# Patient Record
Sex: Female | Born: 1985
Health system: Southern US, Community
[De-identification: ages and names within clinical notes are randomized; demographics above are authoritative.]

## PROBLEM LIST (undated history)

## (undated) ENCOUNTER — Inpatient Hospital Stay (HOSPITAL_COMMUNITY): Payer: Self-pay

## (undated) DIAGNOSIS — F32A Depression, unspecified: Secondary | ICD-10-CM

## (undated) DIAGNOSIS — Z803 Family history of malignant neoplasm of breast: Secondary | ICD-10-CM

## (undated) DIAGNOSIS — B001 Herpesviral vesicular dermatitis: Secondary | ICD-10-CM

## (undated) DIAGNOSIS — F410 Panic disorder [episodic paroxysmal anxiety] without agoraphobia: Secondary | ICD-10-CM

## (undated) DIAGNOSIS — F329 Major depressive disorder, single episode, unspecified: Secondary | ICD-10-CM

## (undated) DIAGNOSIS — IMO0002 Reserved for concepts with insufficient information to code with codable children: Secondary | ICD-10-CM

## (undated) DIAGNOSIS — Z8042 Family history of malignant neoplasm of prostate: Secondary | ICD-10-CM

## (undated) DIAGNOSIS — F419 Anxiety disorder, unspecified: Secondary | ICD-10-CM

## (undated) DIAGNOSIS — Z808 Family history of malignant neoplasm of other organs or systems: Secondary | ICD-10-CM

## (undated) DIAGNOSIS — Z8481 Family history of carrier of genetic disease: Secondary | ICD-10-CM

## (undated) DIAGNOSIS — O24419 Gestational diabetes mellitus in pregnancy, unspecified control: Secondary | ICD-10-CM

## (undated) HISTORY — DX: Family history of carrier of genetic disease: Z84.81

## (undated) HISTORY — DX: Reserved for concepts with insufficient information to code with codable children: IMO0002

## (undated) HISTORY — DX: Family history of malignant neoplasm of breast: Z80.3

## (undated) HISTORY — PX: WISDOM TOOTH EXTRACTION: SHX21

## (undated) HISTORY — DX: Family history of malignant neoplasm of other organs or systems: Z80.8

## (undated) HISTORY — DX: Depression, unspecified: F32.A

## (undated) HISTORY — DX: Major depressive disorder, single episode, unspecified: F32.9

## (undated) HISTORY — DX: Family history of malignant neoplasm of prostate: Z80.42

## (undated) HISTORY — DX: Anxiety disorder, unspecified: F41.9

## (undated) HISTORY — DX: Herpesviral vesicular dermatitis: B00.1

## (undated) HISTORY — DX: Panic disorder (episodic paroxysmal anxiety): F41.0

---
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
## 2010-02-01 ENCOUNTER — Encounter: Admission: RE | Admit: 2010-02-01 | Discharge: 2010-02-01 | Payer: Self-pay | Admitting: Gastroenterology

## 2010-03-01 ENCOUNTER — Encounter: Admission: RE | Admit: 2010-03-01 | Discharge: 2010-03-01 | Payer: Self-pay | Admitting: Emergency Medicine

## 2010-06-10 ENCOUNTER — Encounter: Payer: Self-pay | Admitting: Pediatrics

## 2010-10-04 ENCOUNTER — Emergency Department (HOSPITAL_COMMUNITY)
Admission: EM | Admit: 2010-10-04 | Discharge: 2010-10-04 | Disposition: A | Discharge status: Home / Self Care | Payer: 59 | Attending: Emergency Medicine | Admitting: Emergency Medicine

## 2010-10-04 DIAGNOSIS — Z79899 Other long term (current) drug therapy: Secondary | ICD-10-CM | POA: Insufficient documentation

## 2010-10-04 DIAGNOSIS — T50995A Adverse effect of other drugs, medicaments and biological substances, initial encounter: Secondary | ICD-10-CM | POA: Insufficient documentation

## 2010-10-04 DIAGNOSIS — T4275XA Adverse effect of unspecified antiepileptic and sedative-hypnotic drugs, initial encounter: Secondary | ICD-10-CM | POA: Insufficient documentation
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
## 2011-02-27 ENCOUNTER — Emergency Department (HOSPITAL_COMMUNITY)
Admission: EM | Admit: 2011-02-27 | Discharge: 2011-02-27 | Disposition: A | Discharge status: Home / Self Care | Payer: 59 | Attending: Emergency Medicine | Admitting: Emergency Medicine

## 2011-02-27 DIAGNOSIS — F319 Bipolar disorder, unspecified: Secondary | ICD-10-CM | POA: Insufficient documentation

## 2011-02-27 DIAGNOSIS — F411 Generalized anxiety disorder: Secondary | ICD-10-CM | POA: Insufficient documentation

## 2011-02-27 DIAGNOSIS — Z79899 Other long term (current) drug therapy: Secondary | ICD-10-CM | POA: Insufficient documentation

## 2011-02-27 LAB — COMPREHENSIVE METABOLIC PANEL
ALT: 10 U/L (ref 0–35)
AST: 15 U/L (ref 0–37)
Albumin: 3.5 g/dL (ref 3.5–5.2)
Alkaline Phosphatase: 66 U/L (ref 39–117)
BUN: 7 mg/dL (ref 6–23)
CO2: 27 mEq/L (ref 19–32)
Calcium: 9.4 mg/dL (ref 8.4–10.5)
Chloride: 103 mEq/L (ref 96–112)
Creatinine, Ser: 0.66 mg/dL (ref 0.50–1.10)
GFR calc Af Amer: >90 mL/min (ref >90)
GFR calc non Af Amer: >90 mL/min (ref >90)
Glucose, Bld: 78 mg/dL (ref 70–99)
Potassium: 3.9 mEq/L (ref 3.5–5.1)
Sodium: 136 mEq/L (ref 135–145)
Total Bilirubin: 0.2 mg/dL — ABNORMAL LOW (ref 0.3–1.2)
Total Protein: 6.9 g/dL (ref 6.0–8.3)

## 2011-02-27 LAB — DIFFERENTIAL
Basophils Absolute: 0 10*3/uL (ref 0.0–0.1)
Basophils Relative: 0 % (ref 0–1)
Eosinophils Absolute: 0.1 10*3/uL (ref 0.0–0.7)
Eosinophils Relative: 2 % (ref 0–5)
Lymphocytes Relative: 45 % (ref 12–46)
Lymphs Abs: 2.3 10*3/uL (ref 0.7–4.0)
Monocytes Absolute: 0.4 10*3/uL (ref 0.1–1.0)
Monocytes Relative: 9 % (ref 3–12)
Neutro Abs: 2.3 10*3/uL (ref 1.7–7.7)
Neutrophils Relative %: 45 % (ref 43–77)

## 2011-02-27 LAB — CBC
HCT: 38.1 % (ref 36.0–46.0)
Hemoglobin: 12.6 g/dL (ref 12.0–15.0)
MCH: 29.5 pg (ref 26.0–34.0)
MCHC: 33.1 g/dL (ref 30.0–36.0)
MCV: 89.2 fL (ref 78.0–100.0)
Platelets: 203 10*3/uL (ref 150–400)
RBC: 4.27 MIL/uL (ref 3.87–5.11)
RDW: 12.7 % (ref 11.5–15.5)
WBC: 5.1 10*3/uL (ref 4.0–10.5)

## 2011-02-27 LAB — ETHANOL: Alcohol, Ethyl (B): <11 mg/dL (ref 0–11)

## 2011-02-27 LAB — RAPID URINE DRUG SCREEN, HOSP PERFORMED
Amphetamines: NOT DETECTED
Barbiturates: NOT DETECTED
Benzodiazepines: NOT DETECTED
Cocaine: NOT DETECTED
Opiates: NOT DETECTED
Tetrahydrocannabinol: NOT DETECTED

## 2011-03-05 ENCOUNTER — Inpatient Hospital Stay (HOSPITAL_COMMUNITY)
Admission: RE | Admit: 2011-03-05 | Discharge: 2011-03-07 | DRG: 885 | Disposition: A | Discharge status: Home / Self Care | Payer: 59 | Attending: Psychiatry | Admitting: Psychiatry

## 2011-03-05 DIAGNOSIS — F411 Generalized anxiety disorder: Secondary | ICD-10-CM

## 2011-03-05 DIAGNOSIS — R45851 Suicidal ideations: Secondary | ICD-10-CM

## 2011-03-05 DIAGNOSIS — F339 Major depressive disorder, recurrent, unspecified: Principal | ICD-10-CM

## 2011-03-05 DIAGNOSIS — IMO0002 Reserved for concepts with insufficient information to code with codable children: Secondary | ICD-10-CM

## 2011-03-06 DIAGNOSIS — F411 Generalized anxiety disorder: Secondary | ICD-10-CM

## 2011-03-06 DIAGNOSIS — F339 Major depressive disorder, recurrent, unspecified: Secondary | ICD-10-CM

## 2011-03-06 LAB — URINALYSIS, ROUTINE W REFLEX MICROSCOPIC
Bilirubin Urine: NEGATIVE
Glucose, UA: NEGATIVE mg/dL
Hgb urine dipstick: NEGATIVE
Ketones, ur: NEGATIVE mg/dL
Leukocytes, UA: NEGATIVE
Nitrite: NEGATIVE
Protein, ur: NEGATIVE mg/dL
Specific Gravity, Urine: 1.003 — ABNORMAL LOW (ref 1.005–1.030)
Urobilinogen, UA: 0.2 mg/dL (ref 0.0–1.0)
pH: 6 (ref 5.0–8.0)

## 2011-03-06 LAB — COMPREHENSIVE METABOLIC PANEL
ALT: 13 U/L (ref 0–35)
AST: 15 U/L (ref 0–37)
Albumin: 3.2 g/dL — ABNORMAL LOW (ref 3.5–5.2)
Alkaline Phosphatase: 59 U/L (ref 39–117)
BUN: 9 mg/dL (ref 6–23)
CO2: 28 mEq/L (ref 19–32)
Calcium: 9.4 mg/dL (ref 8.4–10.5)
Chloride: 102 mEq/L (ref 96–112)
Creatinine, Ser: 0.61 mg/dL (ref 0.50–1.10)
GFR calc Af Amer: >90 mL/min (ref >90)
GFR calc non Af Amer: >90 mL/min (ref >90)
Glucose, Bld: 87 mg/dL (ref 70–99)
Potassium: 4 mEq/L (ref 3.5–5.1)
Sodium: 137 mEq/L (ref 135–145)
Total Bilirubin: 0.3 mg/dL (ref 0.3–1.2)
Total Protein: 6.4 g/dL (ref 6.0–8.3)

## 2011-03-06 LAB — CBC
HCT: 37.4 % (ref 36.0–46.0)
Hemoglobin: 12.4 g/dL (ref 12.0–15.0)
MCH: 29.7 pg (ref 26.0–34.0)
MCHC: 33.2 g/dL (ref 30.0–36.0)
MCV: 89.5 fL (ref 78.0–100.0)
Platelets: 179 10*3/uL (ref 150–400)
RBC: 4.18 MIL/uL (ref 3.87–5.11)
RDW: 12.9 % (ref 11.5–15.5)
WBC: 6.5 10*3/uL (ref 4.0–10.5)

## 2011-03-06 LAB — PREGNANCY, URINE: Preg Test, Ur: NEGATIVE

## 2011-03-06 LAB — TSH: TSH: 3.098 u[IU]/mL (ref 0.350–4.500)

## 2011-03-07 LAB — URINE DRUGS OF ABUSE SCREEN W ALC, ROUTINE (REF LAB)
Amphetamine Screen, Ur: NEGATIVE
Barbiturate Quant, Ur: NEGATIVE
Benzodiazepines.: NEGATIVE
Cocaine Metabolites: NEGATIVE
Creatinine,U: 24.8 mg/dL
Ethyl Alcohol: <10 mg/dL (ref <10)
Marijuana Metabolite: NEGATIVE
Methadone: NEGATIVE
Opiate Screen, Urine: NEGATIVE
Phencyclidine (PCP): NEGATIVE
Propoxyphene: NEGATIVE
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
## 2011-03-09 NOTE — Discharge Summary (Signed)
  NAMEGENEIVE, SANDSTROM NO.:  [id number]  MEDICAL RECORD NO.:  [id number]  LOCATION:  0507                          FACILITY:  [facility]  PHYSICIAN:  Franchot Gallo, MD     DATE OF BIRTH:  1986/03/13  DATE OF ADMISSION:  03/05/2011 DATE OF DISCHARGE:  03/07/2011                              DISCHARGE SUMMARY   REASON FOR ADMISSION:  A 25 year old female admitted after having possible side effects to her medications.  Reported a recent increase of her Abilify from 2.5 mg to 20 mg.  She was feeling out of control, did not feel safe driving.  She states when she was feeling out of control she was also feeling suicidal.  FINAL IMPRESSION:  Axis I:  Major depressive disorder, recurrent. Generalized anxiety disorder. Axis II:  Deferred. Axis III:  No acute illnesses. Axis IV:  Medication side effects. Axis V:  Global Assessment of Functioning at discharge was 75.  PERTINENT LABS:  CBC was within normal limits.  Urine pregnancy test was negative.  SIGNIFICANT FINDINGS:  The patient was admitted to the adult milieu for safety and stabilization.  We discontinued her Abilify, but did continue with her Celexa.  She was attending groups and beginning to feel better off her medications.  On day of discharge, the patient reported having good sleep, good appetite, depression had resolved, rating it a 0 on a scale of 1 to 10.  She adamantly denied any suicidal or homicidal thoughts or auditory hallucinations or delusional thinking.  Anxiety had resolved, rating it a 0 on a scale of 1 to 10, having no medication-related side effects.  DISCHARGE MEDICATIONS:  Celexa 40 mg 1 daily.  The patient was to stop taking her Abilify.  FOLLOW-UP APPOINTMENT:  With Dr. Jennelle Human, phone number [phone], and Charlsie Merles, PhD, on October 22nd at 6:15.     Landry Corporal, N.P.   ______________________________ Franchot Gallo, MD    JO/MEDQ  D:  03/08/2011  T:  03/08/2011  Job:   [id number]  Electronically Signed by Limmie PatriciaP. on 03/09/2011 09:15:48 AM Electronically Signed by Franchot Gallo MD on 03/09/2011 04:13:08 PM

## 2011-06-08 ENCOUNTER — Ambulatory Visit (INDEPENDENT_AMBULATORY_CARE_PROVIDER_SITE_OTHER): Payer: 59

## 2011-06-08 DIAGNOSIS — J019 Acute sinusitis, unspecified: Secondary | ICD-10-CM

## 2011-06-08 DIAGNOSIS — Z888 Allergy status to other drugs, medicaments and biological substances status: Secondary | ICD-10-CM

## 2011-06-08 DIAGNOSIS — L501 Idiopathic urticaria: Secondary | ICD-10-CM

## 2011-06-11 ENCOUNTER — Ambulatory Visit: Payer: Self-pay | Admitting: Physician Assistant

## 2011-07-02 ENCOUNTER — Ambulatory Visit (INDEPENDENT_AMBULATORY_CARE_PROVIDER_SITE_OTHER): Payer: 59 | Admitting: Physician Assistant

## 2011-07-02 VITALS — BP 123/88 | HR 103 | Temp 97.2°F | Resp 22 | Ht 67.0 in | Wt 135.2 lb

## 2011-07-02 DIAGNOSIS — F32A Depression, unspecified: Secondary | ICD-10-CM

## 2011-07-02 DIAGNOSIS — F411 Generalized anxiety disorder: Secondary | ICD-10-CM

## 2011-07-02 DIAGNOSIS — F419 Anxiety disorder, unspecified: Secondary | ICD-10-CM

## 2011-07-02 DIAGNOSIS — F410 Panic disorder [episodic paroxysmal anxiety] without agoraphobia: Secondary | ICD-10-CM

## 2011-07-02 MED ORDER — CITALOPRAM HYDROBROMIDE 40 MG PO TABS: 40.0000 mg | ORAL_TABLET | Freq: Every day | ORAL | Status: DC

## 2011-07-02 MED ORDER — ALPRAZOLAM 0.5 MG PO TABS: 0.5000 mg | ORAL_TABLET | Freq: Three times a day (TID) | ORAL | Status: DC | PRN

## 2011-07-02 MED ORDER — ALPRAZOLAM 0.25 MG PO TABS: 0.5000 mg | ORAL_TABLET | Freq: Once | ORAL | Status: DC

## 2011-07-02 NOTE — Progress Notes (Signed)
Patient ID: Terri Wilson MRN: [id number], DOB: [birth year], 26 y.o. Date of Encounter: 07/02/2011, 1:00 PM  Primary Physician: No primary provider on file.  Chief Complaint: Anxiety/Panic attack  HPI: 26 year old Caucasian female with history below presents with 3 day history of worsening anxiety and now with panic attack. Patient cannot remember when she last took her Celexa, and has had worsening anxiety, nightmares, and nausea for the past 3 days. This all came to a point around noon today while at work when she felt like she was having a panic attack prompting her to be seen today. Currently she is complaining of being very nervous, having difficulty focusing, and holding her breath. She states her above anxiety began when she took a sip out of her fiance's spit cup thinking it was a Pepsi. This caused her to become nauseated and developed some emesis. Since this episode she states her anxiety level has been increasing. She is currently under a lot of stress both at work and at home, planning for a wedding. Her wedding day is in 1 week, followed by a flight to [place]. She is very anxious about this flight.   Her mother accompanies her in the room today, and states today's episode is the same as previous episodes when she has forgotten to take her Celexa over several days.   She denies any current depression, SI, or HI. She feels like she is lucky to be marrying the man she is marrying.     Past Medical History  Diagnosis Date  . Anxiety and depression   . Panic attack   . Herpes labialis   . Migraine   . Allergic rhinitis      Home Meds: Prior to Admission medications   Medication Sig Start Date End Date Taking? Authorizing Provider  etonogestrel-ethinyl estradiol (NUVARING) 0.12-0.015 MG/24HR vaginal ring Place 1 each vaginally every 28 (twenty-eight) days. Insert vaginally and leave in place for 3 consecutive weeks, then remove for 1 week.   Yes Historical Provider, MD    ALPRAZolam Prudy Feeler) 0.5 MG tablet Take 1 tablet (0.5 mg total) by mouth 3 (three) times daily as needed for sleep. 07/02/11 08/01/11  Anikah Hogge, PA-C  citalopram (CELEXA) 40 MG tablet Take 1 tablet (40 mg total) by mouth daily. 07/02/11 07/01/12  Eula Listen, PA-C    Allergies:  Allergies  Allergen Reactions  . Lamictal (Lamotrigine) Anaphylaxis  . Aspirin     platelet disorder  . Vancomycin Other (See Comments)    Feels hot    History   Social History  . Marital Status: Married    Spouse Name: N/A    Number of Children: N/A  . Years of Education: N/A   Occupational History  . Not on file.   Social History Main Topics  . Smoking status: Current Everyday Smoker -- 0.5 packs/day for 6 years    Types: Cigarettes  . Smokeless tobacco: Never Used  . Alcohol Use: Not on file  . Drug Use: Not on file  . Sexually Active: Not on file   Other Topics Concern  . Not on file   Social History Narrative  . No narrative on file     Review of Systems: Constitutional: negative for chills, fever, night sweats, weight changes, or fatigue  HEENT: negative for vision changes, hearing loss, congestion, rhinorrhea, ST, epistaxis, or sinus pressure Cardiovascular: negative for chest pain or palpitations Respiratory: negative for hemoptysis, wheezing, shortness of breath, or cough Abdominal: negative for abdominal  pain, nausea, vomiting, diarrhea, or constipation Dermatological: negative for rash Neurologic: negative for headache, dizziness, syncope, or SI/HI. Positive for anxiety/panic attack. All other systems reviewed and are otherwise negative with the exception to those above and in the HPI.   Physical Exam: Blood pressure 123/88, pulse 103, temperature 97.2 F (36.2 C), temperature source Oral, resp. rate 22, height 5\' 7"  (1.702 m), weight 135 lb 3.2 oz (61.326 kg), last menstrual period 06/29/2011. Body mass index is 21.18 kg/(m^2). General: Well developed, well nourished, in no acute  distress. Head: Normocephalic, atraumatic, eyes without discharge, sclera non-icteric, nares are without discharge. Oral cavity moist.   Neck: Supple. No thyromegaly. Full ROM. Lungs: Clear bilaterally to auscultation without wheezes, rales, or rhonchi. Breathing is unlabored. Heart: RRR with S1 S2. No murmurs, rubs, or gallops appreciated. Msk:  Strength and tone normal for age. Extremities/Skin: Warm and dry. No clubbing or cyanosis. No edema. No rashes or suspicious lesions. Neuro: Alert and oriented X 3. Moves all extremities spontaneously. Gait is normal. CNII-XII grossly in tact. Psych:  Responds to questions appropriately with a normal affect.      ASSESSMENT AND PLAN:  26 y.o. Caucasian female with panic attack and anxiety  1. Panic attack -Xanax 0.25 mg x 2 given in office -Resolved -Several minutes after taking the above Xanax patient became her usual calm self without any lingering side effects. At discharge she is at baseline without anxiety or panic. Discharged in stable condition. -Wrote for Xanax 0.5 mg #60 1 po tid prn anxiety. To take each night scheduled and prn during the day.  -She is to also take the Xanax above as needed for anxiety secondary to her flight to [place] for her honeymoon.  2. Anxiety  -Restart Celexa 40 mg daily -Took her tab for today during our office visit -Celexa 40 mg #30 1 po daily RF6 -Set an alarm to remind her daily to take medication -RTC/ER precautions    Signed,  Kesley Mullens, PA-C 07/02/2011 1:00 PM

## 2011-07-22 ENCOUNTER — Ambulatory Visit: Payer: 59

## 2011-07-22 ENCOUNTER — Ambulatory Visit (INDEPENDENT_AMBULATORY_CARE_PROVIDER_SITE_OTHER): Payer: 59 | Admitting: Internal Medicine

## 2011-07-22 VITALS — BP 115/76 | HR 87 | Temp 97.7°F | Resp 16

## 2011-07-22 DIAGNOSIS — M549 Dorsalgia, unspecified: Secondary | ICD-10-CM

## 2011-07-22 DIAGNOSIS — M62830 Muscle spasm of back: Secondary | ICD-10-CM

## 2011-07-22 DIAGNOSIS — M5380 Other specified dorsopathies, site unspecified: Secondary | ICD-10-CM

## 2011-07-22 MED ORDER — CYCLOBENZAPRINE HCL 5 MG PO TABS: 5.0000 mg | ORAL_TABLET | Freq: Three times a day (TID) | ORAL | Status: DC | PRN

## 2011-07-22 MED ORDER — HYDROCODONE-ACETAMINOPHEN 5-500 MG PO TABS: 1.0000 | ORAL_TABLET | Freq: Four times a day (QID) | ORAL | Status: DC | PRN

## 2011-07-22 NOTE — Patient Instructions (Signed)
Flexeril, ultram as directed. Ice to area for 24 hours then warm moist compresses. Range of motion exercises. You will feel stiff and sore for several days esp in the am. If you develop numbness ro tingling in your legs return to the office for re evaluation.
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
## 2011-07-31 ENCOUNTER — Emergency Department (HOSPITAL_COMMUNITY)
Admission: EM | Admit: 2011-07-31 | Discharge: 2011-07-31 | Disposition: A | Discharge status: Home Health | Payer: 59 | Source: Home / Self Care

## 2011-07-31 ENCOUNTER — Encounter (HOSPITAL_COMMUNITY): Payer: Self-pay | Admitting: Emergency Medicine

## 2011-07-31 DIAGNOSIS — J019 Acute sinusitis, unspecified: Secondary | ICD-10-CM

## 2011-07-31 MED ORDER — FLUCONAZOLE 150 MG PO TABS: 150.0000 mg | ORAL_TABLET | Freq: Once | ORAL | Status: AC

## 2011-07-31 MED ORDER — AMOXICILLIN-POT CLAVULANATE 875-125 MG PO TABS: 1.0000 | ORAL_TABLET | Freq: Two times a day (BID) | ORAL | Status: DC

## 2011-07-31 NOTE — ED Notes (Signed)
PT ERE WITH SINUS INFECTION THAT STARTED LAST Thursday BUT HAS WORSENED WITH URI SX CONGESTION,DRY COUGH AND BODY ACHES,CHILLS.AFEBRILE.PT STATES MUCINEX NOT WORKING.VSS

## 2011-07-31 NOTE — ED Provider Notes (Signed)
History     CSN: [id number]  Arrival date & time 07/31/11  1845   None     Chief Complaint  Patient presents with  . Sinusitis  . URI    (Consider location/radiation/quality/duration/timing/severity/associated sxs/prior treatment) HPI Comments: Patient presents today with complaints of nasal congestion, sinus pressure, postnasal drainage, cough and fever. Symptoms began states ago and are progressively worsening. Fever began today and temperature was 101 at home when she checked. Nasal mucous is thick and green at times. Cough is mild and is usually nonproductive. No dyspnea or wheezing. She's only taken one dose of Mucinex since onset of symptoms. She is using an over-the-counter medication for fever.   Past Medical History  Diagnosis Date  . Anxiety and depression   . Panic attack   . Herpes labialis   . Migraine   . Allergic rhinitis     Past Surgical History  Procedure Date  . Tonsillectomy and adenoidectomy 2001  . Wisdom tooth extraction     No family history on file.  History  Substance Use Topics  . Smoking status: Current Everyday Smoker -- 0.5 packs/day for 6 years    Types: Cigarettes  . Smokeless tobacco: Never Used  . Alcohol Use: No    OB History    Grav Para Term Preterm Abortions TAB SAB Ect Mult Living                  Review of Systems  Constitutional: Positive for fever, chills and fatigue.  HENT: Positive for ear pain, congestion, sore throat, rhinorrhea, postnasal drip and sinus pressure.   Respiratory: Positive for cough. Negative for shortness of breath and wheezing.   Cardiovascular: Negative for chest pain.    Allergies  Lamictal; Aspirin; and Vancomycin  Home Medications   Current Outpatient Rx  Name Route Sig Dispense Refill  . CITALOPRAM HYDROBROMIDE 40 MG PO TABS Oral Take 1 tablet (40 mg total) by mouth daily. 30 tablet 6  . AMOXICILLIN-POT CLAVULANATE 875-125 MG PO TABS Oral Take 1 tablet by mouth 2 (two) times daily with  a meal. 20 tablet 0  . ETONOGESTREL-ETHINYL ESTRADIOL 0.12-0.015 MG/24HR VA RING Vaginal Place 1 each vaginally every 28 (twenty-eight) days. Insert vaginally and leave in place for 3 consecutive weeks, then remove for 1 week.    Marland Kitchen FLUCONAZOLE 150 MG PO TABS Oral Take 1 tablet (150 mg total) by mouth once. 1 tablet 0    BP 108/73  Pulse 86  Temp(Src) 98 F (36.7 C) (Oral)  Resp 16  SpO2 100%  LMP 07/20/2011  Physical Exam  Nursing note and vitals reviewed. Constitutional: She appears well-developed and well-nourished. No distress.  HENT:  Head: Normocephalic and atraumatic.  Right Ear: Tympanic membrane, external ear and ear canal normal.  Left Ear: Tympanic membrane, external ear and ear canal normal.  Nose: Mucosal edema present. Right sinus exhibits maxillary sinus tenderness. Left sinus exhibits maxillary sinus tenderness.  Mouth/Throat: Uvula is midline, oropharynx is clear and moist and mucous membranes are normal. No oropharyngeal exudate, posterior oropharyngeal edema or posterior oropharyngeal erythema.  Neck: Neck supple.  Cardiovascular: Normal rate, regular rhythm and normal heart sounds.   Pulmonary/Chest: Effort normal and breath sounds normal. No respiratory distress.  Lymphadenopathy:    She has no cervical adenopathy.  Neurological: She is alert.  Skin: Skin is warm and dry.  Psychiatric: She has a normal mood and affect.    ED Course  Procedures (including critical care time)  Labs Reviewed -  No data to display No results found.   1. Acute sinusitis       MDM          Melody Comas, [place] 07/31/11 2058

## 2011-07-31 NOTE — Discharge Instructions (Signed)
Tylenol or Ibuprofen as needed for fever or discomfort. Take an over the counter antihistamine such as Claritin or Zyrtec. You may also use Mucinex D to help with sinus pressure and congestion. Increase fluids and rest.  Return if symptoms change or worsen.

## 2011-08-01 NOTE — ED Provider Notes (Signed)
Medical screening examination/treatment/procedure(s) were performed by non-physician practitioner and as supervising physician I was immediately available for consultation/collaboration.   [facility]; MD   Sharin Grave, MD 08/01/11 1150

## 2011-08-09 ENCOUNTER — Ambulatory Visit (INDEPENDENT_AMBULATORY_CARE_PROVIDER_SITE_OTHER): Payer: 59 | Admitting: Emergency Medicine

## 2011-08-09 VITALS — BP 124/78 | HR 90 | Temp 97.6°F | Resp 16 | Ht 66.24 in | Wt 135.0 lb

## 2011-08-09 DIAGNOSIS — R519 Headache, unspecified: Secondary | ICD-10-CM

## 2011-08-09 DIAGNOSIS — R112 Nausea with vomiting, unspecified: Secondary | ICD-10-CM

## 2011-08-09 MED ORDER — KETOROLAC TROMETHAMINE 60 MG/2ML IM SOLN
60.0000 mg | Freq: Once | INTRAMUSCULAR | Status: AC
  Administered 2011-08-09: 60 mg via INTRAMUSCULAR

## 2011-08-09 MED ORDER — ONDANSETRON 4 MG PO TBDP
8.0000 mg | ORAL_TABLET | Freq: Once | ORAL | Status: AC
  Administered 2011-08-09: 8 mg via ORAL

## 2011-08-09 NOTE — Patient Instructions (Signed)
Avoid food and drink until evaluated at the emergency room for severe headache.  Migraine Headache A migraine headache is an intense, throbbing pain on one or both sides of your head. The exact cause of a migraine headache is not always known. A migraine may be caused when nerves in the brain become irritated and release chemicals that cause swelling within blood vessels, causing pain. Many migraine sufferers have a family history of migraines. Before you get a migraine you may or may not get an aura. An aura is a group of symptoms that can predict the beginning of a migraine. An aura may include:  Visual changes such as:   Flashing lights.   Bright spots or zig-zag lines.   Tunnel vision.   Feelings of numbness.   Trouble talking.   Muscle weakness.  SYMPTOMS  Pain on one or both sides of your head.   Pain that is pulsating or throbbing in nature.   Pain that is severe enough to prevent daily activities.   Pain that is aggravated by any daily physical activity.   Nausea (feeling sick to your stomach), vomiting, or both.   Pain with exposure to bright lights, loud noises, or activity.   General sensitivity to bright lights or loud noises.  MIGRAINE TRIGGERS Examples of triggers of migraine headaches include:   Alcohol.   Smoking.   Stress.   It may be related to menses (female menstruation).   Aged cheeses.   Foods or drinks that contain nitrates, glutamate, aspartame, or tyramine.   Lack of sleep.   Chocolate.   Caffeine.   Hunger.   Medications such as nitroglycerine (used to treat chest pain), birth control pills, estrogen, and some blood pressure medications.  DIAGNOSIS  A migraine headache is often diagnosed based on:  Symptoms.   Physical examination.   A computerized X-ray scan (computed tomography, CT) of your head.  TREATMENT  Medications can help prevent migraines if they are recurrent or should they become recurrent. Your caregiver can  help you with a medication or treatment program that will be helpful to you.   Lying down in a dark, quiet room may be helpful.   Keeping a headache diary may help you find a trend as to what may be triggering your headaches.  SEEK IMMEDIATE MEDICAL CARE IF:   You have confusion, personality changes or seizures.   You have headaches that wake you from sleep.   You have an increased frequency in your headaches.   You have a stiff neck.   You have a loss of vision.   You have muscle weakness.   You start losing your balance or have trouble walking.   You feel faint or pass out.  MAKE SURE YOU:   Understand these instructions.   Will watch your condition.   Will get help right away if you are not doing well or get worse.  Document Released: 05/07/2005 Document Revised: 04/26/2011 Document Reviewed: 12/21/2008 [facility] Patient Information 2012 [place], [place].

## 2011-08-09 NOTE — Progress Notes (Signed)
  Subjective:    Patient ID: Terri Wilson, female    DOB: [birth year], 26 y.o.   MRN: [id number]  Migraine  This is a new problem. The current episode started today. The problem occurs constantly. The problem has been unchanged. The pain is located in the bilateral region. The pain does not radiate. The quality of the pain is described as aching. The pain is at a severity of 7/10. The pain is severe. Associated symptoms include nausea, photophobia and rhinorrhea. The symptoms are aggravated by noise. She has tried NSAIDs and acetaminophen for the symptoms. The treatment provided no relief.  Terri Wilson is here today complaining of a severe headache which started around 4 pm today after she ate some jalapeno pepper chips for lunch.  She also had a headache last night after eating some jalapeno chips and simply went to bed.  Upon awakening her headache was gone.  She states this is the worst headache she has ever had but it started gradually not suddenly.  She has not vomited.    Review of Systems  Constitutional: Negative.   HENT: Positive for rhinorrhea.   Eyes: Positive for photophobia.  Respiratory: Negative.   Cardiovascular: Negative.   Gastrointestinal: Positive for nausea.  Genitourinary: Negative.   Neurological: Negative.   Hematological: Negative.        Objective:   Physical Exam  Vitals reviewed. Constitutional: She is oriented to person, place, and time. She appears well-developed and well-nourished. She appears distressed.  HENT:  Head: Normocephalic and atraumatic.  Right Ear: External ear normal.  Mouth/Throat: Oropharynx is clear and moist. No oropharyngeal exudate.  Eyes: Conjunctivae and EOM are normal. Pupils are equal, round, and reactive to light. Right eye exhibits no discharge.  Neck: Neck supple. No tracheal deviation present.  Cardiovascular: Normal rate, regular rhythm and normal heart sounds.   Pulmonary/Chest: Effort normal and breath sounds normal.    Lymphadenopathy:    She has no cervical adenopathy.  Neurological: She is alert and oriented to person, place, and time. She has normal strength and normal reflexes. She displays normal reflexes. No cranial nerve deficit or sensory deficit. Coordination normal.  Reflex Scores:      Patellar reflexes are 2+ on the right side and 2+ on the left side.      Pt unable to stand for Romberg secondary to headache pain  Skin: Skin is warm and dry. She is not diaphoretic.  Psychiatric: She has a normal mood and affect. Her behavior is normal.          Assessment & Plan:  Severe Headache, no relief after 30 minutes with 60 mg of Toradol and 8 mg of Zofran po.  Pt states headache is unrelenting and "the worst of my life".  She denies feeling anxious.  She feels the next best step would be to go to the ED for eval and treatment immediately.  Her Mother is called and is enroute to pick her up with the plan to go to [facility] for evaluation and treatment of severe headache.  Discussed treatment plan with Dr. Cleta Alberts

## 2011-09-12 ENCOUNTER — Ambulatory Visit (INDEPENDENT_AMBULATORY_CARE_PROVIDER_SITE_OTHER): Payer: 59 | Admitting: Family Medicine

## 2011-09-12 VITALS — BP 106/72 | HR 92 | Temp 99.0°F | Resp 16 | Ht 67.0 in | Wt 136.0 lb

## 2011-09-12 DIAGNOSIS — N912 Amenorrhea, unspecified: Secondary | ICD-10-CM

## 2011-09-12 NOTE — Progress Notes (Signed)
  [facility]:  Office Visit  Chief Complaint:  Chief Complaint  Patient presents with  . Back Pain    HPI: Terri Wilson is a 26 y.o. female who complains of amennorhea LMP March 29 . Worried about being pregnant. She nad husband had unprotected sex x 1. Has had sxs of nausea, constipation. She recently went to OB/Gyn and had US done which showed she "ovulated" and there was la left ovarian cyst. She has done a urine pregnancy test and that was negative.   Past Medical History  Diagnosis Date  . Anxiety and depression   . Panic attack   . Herpes labialis   . Migraine   . Allergic rhinitis    Past Surgical History  Procedure Date  . Tonsillectomy and adenoidectomy 2001  . Wisdom tooth extraction    History   Social History  . Marital Status: Married    Spouse Name: N/A    Number of Children: N/A  . Years of Education: N/A   Social History Main Topics  . Smoking status: Current Everyday Smoker -- 0.5 packs/day for 6 years    Types: Cigarettes  . Smokeless tobacco: Never Used  . Alcohol Use: No  . Drug Use: No  . Sexually Active: None   Other Topics Concern  . None   Social History Narrative  . None   No family history on file. Allergies  Allergen Reactions  . Lamictal (Lamotrigine) Anaphylaxis  . Vancomycin Other (See Comments)    Feels hot   Prior to Admission medications   Medication Sig Start Date End Date Taking? Authorizing Provider  etonogestrel-ethinyl estradiol (NUVARING) 0.12-0.015 MG/24HR vaginal ring Place 1 each vaginally every 28 (twenty-eight) days. Insert vaginally and leave in place for 3 consecutive weeks, then remove for 1 week.    Historical Provider, MD     ROS: The patient denies fevers, chills, night sweats, unintentional weight loss, chest pain, palpitations, wheezing, dyspnea on exertion, vomiting, abdominal pain, dysuria, hematuria, melena, numbness, weakness, or tingling.   All other systems have been reviewed  and were otherwise negative with the exception of those mentioned in the HPI and as above.    PHYSICAL EXAM: Filed Vitals:   09/12/11 1553  BP: 106/72  Pulse: 92  Temp: 99 F (37.2 C)  Resp: 16   Filed Vitals:   09/12/11 1553  Height: 5\' 7"  (1.702 m)  Weight: 136 lb (61.689 kg)   Body mass index is 21.30 kg/(m^2).  General: Alert, mild anxiety HEENT:  Normocephalic, atraumatic, oropharynx patent.  Cardiovascular:  Regular rate and rhythm, no rubs murmurs or gallops.  No Carotid bruits, radial pulse intact. No pedal edema.  Respiratory: Clear to auscultation bilaterally.  No wheezes, rales, or rhonchi.  No cyanosis, no use of accessory musculature GI: No organomegaly, abdomen is soft and non-tender, positive bowel sounds.  No masses. Skin: No rashes. Neurologic: Facial musculature symmetric. Psychiatric: Patient is appropriate throughout our interaction. Lymphatic: No cervical lymphadenopathy Musculoskeletal: Gait intact.   LABS:  EKG/XRAY:   Primary read interpreted by Dr. Conley Rolls at [facility].   ASSESSMENT/PLAN: Encounter Diagnosis  Name Primary?  . Amenorrhea Yes    Start Pre-natal DC alcohol, tobacco use Recheck pregnancy test at home.Pt declines testing in office.    Hamilton Capri PHUONG, DO 09/12/2011 4:31 PM

## 2011-10-15 ENCOUNTER — Ambulatory Visit (INDEPENDENT_AMBULATORY_CARE_PROVIDER_SITE_OTHER): Payer: 59 | Admitting: Emergency Medicine

## 2011-10-15 VITALS — BP 101/67 | HR 79 | Temp 97.7°F | Resp 16 | Ht 67.25 in | Wt 138.8 lb

## 2011-10-15 DIAGNOSIS — H1030 Unspecified acute conjunctivitis, unspecified eye: Secondary | ICD-10-CM

## 2011-10-15 DIAGNOSIS — H44001 Unspecified purulent endophthalmitis, right eye: Secondary | ICD-10-CM

## 2011-10-15 DIAGNOSIS — H5710 Ocular pain, unspecified eye: Secondary | ICD-10-CM

## 2011-10-15 MED ORDER — OFLOXACIN 0.3 % OP SOLN: OPHTHALMIC | Status: DC

## 2011-10-15 NOTE — Patient Instructions (Signed)
Bacterial Conjunctivitis Bacterial conjunctivitis (pink eye) is caused by germs. These germs are spread from person to person (contagious). The white part of the eye may look red or pink. The eye may be irritated, watery, or have a thick discharge.  HOME CARE   Only take medicine as told by your doctor.   To ease pain, apply a cool, clean washcloth over closed eyelids.   Gently wipe away any fluid coming from the eye with a warm, wet washcloth or cotton ball.   Do not share towels or washcloths. This may spread the infection.   Wash your hands often with soap and water or alcohol-based cleaner. Use paper towels to dry your hands.   Change or wash your pillowcase every day.   Do not use eye makeup until the infection is gone.   Do not use machines or drive if your vision is blurry or you cannot see well.   Stop using contact lenses. Do not use them again until your doctor says it is okay.   Do not touch the tip of the eyedrop bottle or medicine tube with your fingers when you put medicine on the eye.  GET HELP RIGHT AWAY IF:   Your eye is not better after 3 days of medicine.   A yellowish white fluid (pus) is coming out of the eye.   The redness is spreading.   Your vision suddenly gets worse or becomes blurry.   You have pain in the face or puffiness (swelling) around the eye.   You have a temperature by mouth above 102 F (38.9 C), not controlled by medicine.   Your baby is older than 3 months with a rectal temperature of 102 F (38.9 C) or higher.   Your baby is 34 months old or younger with a rectal temperature of 100.4 F (38 C) or higher.  MAKE SURE YOU:   Understand these instructions.   Will watch this condition.   Will get help right away if you are not doing well or get worse.  Document Released: 02/14/2008 Document Revised: 04/26/2011 Document Reviewed: 02/14/2008 [facility] Patient Information 2012 [place], [place].

## 2011-10-15 NOTE — Progress Notes (Signed)
  Subjective:    Patient ID: Terri Wilson, female    DOB: August 02, 1985, 26 y.o.   MRN: [id number]  HPI patient enters with a 48 year history of pain and discomfort around her right eye. This morning she noted a significant amount of crusty drainage around the. She does wear contact lenses but changes them every night and changes every 2 weeks to a new pack    Review of Systems     Objective:   Physical Exam physical exam reveals significant crusting around the right the lashes reveal crusting. Pupils are equal and reactive to light. The disc is normal the fluorescein staining reveals minimal uptake on the periphery of the cornea but otherwise unremarkable. There is significant injection of the scleral conjunctiva        Assessment & Plan:  Assessment acute bacterial conjunctivitis of the right we'll treat with Ocuflox.

## 2011-11-03 ENCOUNTER — Telehealth: Payer: Self-pay

## 2011-11-03 NOTE — Telephone Encounter (Signed)
PATIENT IS IN NEED OF A REFILL OF CELEXA

## 2011-11-04 MED ORDER — CITALOPRAM HYDROBROMIDE 40 MG PO TABS: 40.0000 mg | ORAL_TABLET | Freq: Every day | ORAL | Status: DC

## 2011-11-04 NOTE — Telephone Encounter (Signed)
Pt.notified

## 2011-11-04 NOTE — Telephone Encounter (Signed)
She should have refills until August? If not ok to refill until 12/30/11

## 2011-11-29 ENCOUNTER — Ambulatory Visit (INDEPENDENT_AMBULATORY_CARE_PROVIDER_SITE_OTHER): Payer: 59 | Admitting: Physician Assistant

## 2011-11-29 VITALS — BP 153/88 | HR 114 | Temp 98.2°F | Resp 18

## 2011-11-29 DIAGNOSIS — T148 Other injury of unspecified body region: Secondary | ICD-10-CM

## 2011-11-29 DIAGNOSIS — W57XXXA Bitten or stung by nonvenomous insect and other nonvenomous arthropods, initial encounter: Secondary | ICD-10-CM

## 2011-11-29 NOTE — Progress Notes (Signed)
Patient ID: Terri Wilson MRN: [id number], DOB: [birth year], 26 y.o. Date of Encounter: 11/29/2011, 3:21 PM  Primary Physician: Carola Frost  Chief Complaint: Bee sting  HPI: 26 y.o. year old female presents with insect bite along the anterior portion of the left thigh. Bite occurred approximately 10 minutes ago. Complaining of pruritis and pain. No SOB, wheezing, dyspnea, difficulty swallowing, or angioedema symptoms. Typical reaction for patient to bee sting. Has taken nothing.  Past Medical History  Diagnosis Date  . Anxiety and depression   . Panic attack   . Herpes labialis   . Migraine   . Allergic rhinitis      Home Meds: Prior to Admission medications   Medication Sig Start Date End Date Taking? Authorizing Provider  citalopram (CELEXA) 40 MG tablet Take 1 tablet (40 mg total) by mouth daily. 11/04/11  Yes Nelva Nay, PA-C  etonogestrel-ethinyl estradiol (NUVARING) 0.12-0.015 MG/24HR vaginal ring Place 1 each vaginally every 28 (twenty-eight) days. Insert vaginally and leave in place for 3 consecutive weeks, then remove for 1 week.    Historical Provider, MD  ofloxacin (OCUFLOX) 0.3 % ophthalmic solution 1-2 gtts R eye every 3-4 hrs 10/15/11  No Collene Gobble, MD    Allergies:  Allergies  Allergen Reactions  . Lamictal (Lamotrigine) Anaphylaxis  . Vancomycin Other (See Comments)    Feels hot    History   Social History  . Marital Status: Married    Spouse Name: N/A    Number of Children: N/A  . Years of Education: N/A   Occupational History  . Not on file.   Social History Main Topics  . Smoking status: Current Everyday Smoker -- 0.5 packs/day for 6 years    Types: Cigarettes  . Smokeless tobacco: Never Used  . Alcohol Use: No  . Drug Use: No  . Sexually Active: Not on file   Other Topics Concern  . Not on file   Social History Narrative  . No narrative on file     Review of Systems: Constitutional: negative for chills, fever, night  sweats, weight changes, or fatigue  HEENT: negative for vision changes, hearing loss, congestion, rhinorrhea, ST, epistaxis, or sinus pressure Cardiovascular: negative for chest pain or palpitations Respiratory: negative for hemoptysis, wheezing, shortness of breath, or cough Abdominal: negative for abdominal pain, nausea, vomiting, diarrhea, or constipation Dermatological: see above Neurologic: negative for headache, dizziness, or syncope All other systems reviewed and are otherwise negative with the exception to those above and in the HPI.   Physical Exam: Blood pressure 153/88, pulse 114, temperature 98.2 F (36.8 C), temperature source Oral, resp. rate 18, last menstrual period 11/13/2011, SpO2 98.00%., There is no height or weight on file to calculate BMI. General: Well developed, well nourished, in no acute distress. Head: Normocephalic, atraumatic, eyes without discharge, sclera non-icteric, nares are without discharge. Bilateral auditory canals clear, TM's are without perforation, pearly grey and translucent with reflective cone of light bilaterally. Oral cavity moist, posterior pharynx without exudate, erythema, peritonsillar abscess, or post nasal drip.  Neck: Supple. No thyromegaly. Full ROM. No lymphadenopathy. Lungs: Clear bilaterally to auscultation without wheezes, rales, or rhonchi. Breathing is unlabored. Heart: RRR with S1 S2. No murmurs, rubs, or gallops appreciated. Msk:  Strength and tone normal for age. Extremities/Skin: Left anterior thigh with 3 cm x 3 cm circular erythema with a central area of increased erythema/brusing. No FB.  Neuro: Alert and oriented X 3. Moves all extremities spontaneously. Gait is normal. CNII-XII  grossly in tact. Psych:  Responds to questions appropriately with a normal affect.     ASSESSMENT AND PLAN:  26 y.o. year old female with inscet sting/ bite and mild local allergic reaction. -Localized reaction -Washed with soap and water -Meat  tenderizer applied -Zyrtec 10 mg PO -Zantax 300 mg PO -Ibuprofen PO, has has at her desk -Continue Zyrtec, Zantac for the next 3 days daily, add Benadryl at night -Declined Benadryl -Tetanus up to date -Close follow up this afternoon/evening at work, should reaction worsen will re-evaluate -Hold Prednisone at this time  Elinor Dodge, PA-C 11/29/2011 3:21 PM

## 2011-12-27 ENCOUNTER — Ambulatory Visit (INDEPENDENT_AMBULATORY_CARE_PROVIDER_SITE_OTHER): Payer: 59 | Admitting: Emergency Medicine

## 2011-12-27 VITALS — BP 104/69 | HR 92 | Temp 99.0°F | Resp 16 | Ht 67.25 in | Wt 143.2 lb

## 2011-12-27 DIAGNOSIS — J029 Acute pharyngitis, unspecified: Secondary | ICD-10-CM

## 2011-12-27 DIAGNOSIS — J018 Other acute sinusitis: Secondary | ICD-10-CM

## 2011-12-27 DIAGNOSIS — J40 Bronchitis, not specified as acute or chronic: Secondary | ICD-10-CM

## 2011-12-27 LAB — POCT RAPID STREP A (OFFICE): Rapid Strep A Screen: NEGATIVE

## 2011-12-27 MED ORDER — AMOXICILLIN-POT CLAVULANATE 875-125 MG PO TABS: 1.0000 | ORAL_TABLET | Freq: Two times a day (BID) | ORAL | Status: AC

## 2011-12-27 MED ORDER — PSEUDOEPHEDRINE-GUAIFENESIN ER 60-600 MG PO TB12: 1.0000 | ORAL_TABLET | Freq: Two times a day (BID) | ORAL | Status: DC

## 2011-12-27 MED ORDER — HYDROCODONE-HOMATROPINE 5-1.5 MG/5ML PO SYRP: 5.0000 mL | ORAL_SOLUTION | Freq: Three times a day (TID) | ORAL | Status: AC | PRN

## 2011-12-27 MED ORDER — HYDROCOD POLST-CHLORPHEN POLST 10-8 MG/5ML PO LQCR: 5.0000 mL | Freq: Two times a day (BID) | ORAL | Status: DC | PRN

## 2011-12-27 NOTE — Addendum Note (Signed)
Addended by: Carmelina Dane on: 12/27/2011 05:42 PM   Modules accepted: Orders

## 2011-12-27 NOTE — Progress Notes (Signed)
   Date:  12/27/2011   Name:  Terri Wilson   DOB:  1986-03-10   MRN:  [id number]  PCP:  Carola Frost    Chief Complaint: Sinusitis, Sore Throat and Cough   History of Present Illness:  Terri Wilson is a 26 y.o. very pleasant female patient who presents with the following:  Awoke this morning with sore throat, nasal congestion and headache.  Started last night with malaise.  Poor appetite scratchy throat.  No nausea or vomiting.  No stool change.  Nonproductive cough.  Patient Active Problem List  Diagnosis  . Anxiety and depression  . Panic attack    Past Medical History  Diagnosis Date  . Anxiety and depression   . Panic attack   . Herpes labialis   . Migraine   . Allergic rhinitis     Past Surgical History  Procedure Date  . Tonsillectomy and adenoidectomy 2001  . Wisdom tooth extraction     History  Substance Use Topics  . Smoking status: Current Everyday Smoker -- 0.5 packs/day for 6 years    Types: Cigarettes  . Smokeless tobacco: Never Used  . Alcohol Use: No    No family history on file.  Allergies  Allergen Reactions  . Lamictal (Lamotrigine) Anaphylaxis  . Vancomycin Other (See Comments)    Feels hot    Medication list has been reviewed and updated.  Current Outpatient Prescriptions on File Prior to Visit  Medication Sig Dispense Refill  . citalopram (CELEXA) 40 MG tablet Take 1 tablet (40 mg total) by mouth daily.  30 tablet  1  . etonogestrel-ethinyl estradiol (NUVARING) 0.12-0.015 MG/24HR vaginal ring Place 1 each vaginally every 28 (twenty-eight) days. Insert vaginally and leave in place for 3 consecutive weeks, then remove for 1 week.      Marland Kitchen ofloxacin (OCUFLOX) 0.3 % ophthalmic solution 1-2 gtts R eye every 3-4 hrs  5 mL  1   Current Facility-Administered Medications on File Prior to Visit  Medication Dose Route Frequency Provider Last Rate Last Dose  . ALPRAZolam Prudy Feeler) tablet 0.5 mg  0.5 mg Oral Once Sondra Barges, PA-C         Review of Systems:  As per HPI, otherwise negative.    Physical Examination: Filed Vitals:   12/27/11 1621  BP: 104/69  Pulse: 92  Temp: 99 F (37.2 C)  Resp: 16   Filed Vitals:   12/27/11 1621  Height: 5' 7.25" (1.708 m)  Weight: 143 lb 3.2 oz (64.955 kg)   Body mass index is 22.26 kg/(m^2). Ideal Body Weight: Weight in (lb) to have BMI = 25: 160.5   GEN: WDWN, NAD, Non-toxic, A & O x 3 HEENT: Atraumatic, Normocephalic. Neck supple. No masses, No LAD.  Throat red Neck:  Moderate adenopathy Ears and Nose: No external deformity. CV: RRR, No M/G/R. No JVD. No thrill. No extra heart sounds. PULM: CTA B, no wheezes, crackles, rhonchi. No retractions. No resp. distress. No accessory muscle use. ABD: S, NT, ND, +BS. No rebound. No HSM. EXTR: No c/c/e NEURO Normal gait.  PSYCH: Normally interactive. Conversant. Not depressed or anxious appearing.  Calm demeanor.    Assessment and Plan: Sinusitis Pharyngitis Bronchitis Follow up as needed  Carmelina Dane, MD

## 2012-01-09 ENCOUNTER — Other Ambulatory Visit: Payer: Self-pay | Admitting: *Deleted

## 2012-01-09 MED ORDER — CITALOPRAM HYDROBROMIDE 40 MG PO TABS: 40.0000 mg | ORAL_TABLET | Freq: Every day | ORAL | Status: DC

## 2012-02-21 ENCOUNTER — Ambulatory Visit: Payer: 59

## 2012-02-29 ENCOUNTER — Encounter: Payer: Self-pay | Admitting: Family Medicine

## 2012-02-29 ENCOUNTER — Ambulatory Visit (INDEPENDENT_AMBULATORY_CARE_PROVIDER_SITE_OTHER): Payer: 59 | Admitting: Family Medicine

## 2012-02-29 VITALS — BP 100/60 | HR 82 | Temp 97.8°F | Resp 16 | Ht 68.5 in | Wt 141.8 lb

## 2012-02-29 DIAGNOSIS — N912 Amenorrhea, unspecified: Secondary | ICD-10-CM

## 2012-02-29 DIAGNOSIS — N911 Secondary amenorrhea: Secondary | ICD-10-CM

## 2012-02-29 LAB — POCT URINE PREGNANCY: Preg Test, Ur: NEGATIVE

## 2012-02-29 MED ORDER — ETONOGESTREL-ETHINYL ESTRADIOL 0.12-0.015 MG/24HR VA RING: VAGINAL_RING | VAGINAL | Status: DC

## 2012-02-29 NOTE — Patient Instructions (Addendum)
Your pregnancy test is negative; if you do not start your menses within the next week, you will need to take a hormone medication to get it started. I have refilled your NUVARING but do not restart it until you have resumed your period. Abstain from sex until your period starts and you have resumed the NUVARING.

## 2012-02-29 NOTE — Progress Notes (Signed)
S:  This 26 y.o. Cauc female has normal regular menses but today is 8 days late. She was using NUVARING but discontinued this in February 2013. She is not desiring pregnancy at this time. She usually has breast tenderness before onset of menses but has none at present and is not having dysmenorrhea. She and her husband are not using any other form of birth control. She had a "negative" pregnancy test with a [employer] OTC test. She has had some stress in her personal life recently.  O: Filed Vitals:   02/29/12 1053  BP: 100/60  Pulse: 82  Temp: 97.8 F (36.6 C)  Resp: 16   GEN: In NAD: WN,WD. HEENT: /AT; EOMI, conj/scl clear. COR: RRR. LUNGS: Normal resp rate and effort. NEURO: A&O x 3; CNs intact. Nonfocal.  Results for orders placed in visit on 02/29/12  POCT URINE PREGNANCY      Component Value Range   Preg Test, Ur Negative       A/P: 1. Amenorrhea, secondary - suspect stress-related. Pt would like to resume NUVARING. Restart after next menses. POCT urine pregnancy Abstain from intercourse for 1 week; if no menses in next week, then will need Provera x 10 days. Advised to minimize stress and normalize nutrition and sleep, etc.

## 2012-03-23 ENCOUNTER — Ambulatory Visit: Payer: 59

## 2012-03-23 ENCOUNTER — Ambulatory Visit (INDEPENDENT_AMBULATORY_CARE_PROVIDER_SITE_OTHER): Payer: 59 | Admitting: Family Medicine

## 2012-03-23 VITALS — BP 110/75 | HR 95 | Temp 98.5°F | Resp 16

## 2012-03-23 DIAGNOSIS — R05 Cough: Secondary | ICD-10-CM

## 2012-03-23 DIAGNOSIS — R059 Cough, unspecified: Secondary | ICD-10-CM

## 2012-03-23 MED ORDER — CEFDINIR 300 MG PO CAPS: 600.0000 mg | ORAL_CAPSULE | Freq: Every day | ORAL | Status: DC

## 2012-03-23 MED ORDER — METHYLPREDNISOLONE ACETATE 80 MG/ML IJ SUSP
80.0000 mg | Freq: Once | INTRAMUSCULAR | Status: AC
  Administered 2012-03-23: 80 mg via INTRAMUSCULAR

## 2012-03-23 MED ORDER — IPRATROPIUM BROMIDE 0.03 % NA SOLN: 2.0000 | Freq: Two times a day (BID) | NASAL | Status: DC

## 2012-03-23 MED ORDER — HYDROCODONE-HOMATROPINE 5-1.5 MG/5ML PO SYRP: 5.0000 mL | ORAL_SOLUTION | Freq: Three times a day (TID) | ORAL | Status: DC | PRN

## 2012-03-23 NOTE — Patient Instructions (Signed)
Get plenty of rest and drink at least 64 ounces of water daily. 

## 2012-03-23 NOTE — Progress Notes (Signed)
Xray read and patient discussed with Chelle Jeffery, PA-C. . Agree with assessment and plan of care per her note.   

## 2012-03-23 NOTE — Progress Notes (Signed)
Subjective:    Patient ID: Terri Wilson, female    DOB: 1986-05-14, 26 y.o.   MRN: [id number]  HPI This 26 y.o. female presents for evaluation of illness x 11 days.  Began as "What I think was a really bad sinusitis."  Then moved into the chest about 8 days ago.  Cough has become progressively worse.  Worse with lying down to sleep.  Coughs to gagging.  Feels feverish and chilled.  Wakes during the night drenched in sweat and had to change clothes.  No nausea, diarrhea.  Review of Systems As above.   Past Medical History  Diagnosis Date  . Anxiety and depression   . Panic attack   . Herpes labialis   . Migraine   . Allergic rhinitis     Past Surgical History  Procedure Date  . Tonsillectomy and adenoidectomy 2001  . Wisdom tooth extraction     Prior to Admission medications   Medication Sig Start Date End Date Taking? Authorizing Provider  citalopram (CELEXA) 40 MG tablet Take 1 tablet (40 mg total) by mouth daily. 01/09/12  Yes Ryan Adria Devon, PA-C  etonogestrel-ethinyl estradiol (NUVARING) 0.12-0.015 MG/24HR vaginal ring Place 1 each vaginally every 28 (twenty-eight) days. Insert vaginally and leave in place for 3 consecutive weeks, then remove for 1 week. 02/29/12  Yes Maurice March, MD  pseudoephedrine-guaifenesin [employer] D) 60-600 MG per tablet Take 1 tablet by mouth every 12 (twelve) hours. 12/27/11 12/26/12 Yes Phillips Odor, MD    Allergies  Allergen Reactions  . Lamictal (Lamotrigine) Anaphylaxis  . Vancomycin Other (See Comments)    Feels hot    History   Social History  . Marital Status: Married    Spouse Name: [place]    Number of Children: 0  . Years of Education: 14   Occupational History  . [employer] ASS[employer]    [employer]   Social History Main Topics  . Smoking status: Current Every Day Smoker -- 0.5 packs/day for 6 years    Types: Cigarettes  . Smokeless tobacco: Never Used  . Alcohol Use: No  . Drug Use: No  . Sexually Active:  Yes -- Female partner(s)    Birth Control/ Protection: Other-see comments     Comment: NuvaRIng   Other Topics Concern  . Not on file   Social History Narrative   Lives with husband.    History reviewed. No pertinent family history.     Objective:   Physical Exam Blood pressure 110/75, pulse 95, temperature 98.5 F (36.9 C), temperature source Oral, resp. rate 16, last menstrual period 03/09/2012, SpO2 98.00%. There is no height or weight on file to calculate BMI. Well-developed, well nourished WF who is awake, alert and oriented, in mild distress; coughing. HEENT: [place]/AT, PERRL, EOMI.  Sclera and conjunctiva are clear.  EAC are patent, TMs are normal in appearance. Nasal mucosa is congested, pink and moist. OP is clear. No maxillary or frontal sinus tenderness. Neck: supple, non-tender, no lymphadenopathy, thyromegaly. Heart: RRR, no murmur Lungs: normal effort, CTA Extremities: no cyanosis, clubbing or edema. Skin: warm and dry without rash. Psychologic: good mood and appropriate affect, normal speech and behavior.  CXR: [employer] reading (PRIMARY) by  Dr. Neva Seat. Increased right hilar markings, no infiltrates.     Assessment & Plan:   1. Cough  DG Chest 2 View, ipratropium (ATROVENT) 0.03 % nasal spray, cefdinir (OMNICEF) 300 MG capsule, methylPREDNISolone acetate (DEPO-MEDROL) injection 80 mg, HYDROcodone-homatropine (HYCODAN) 5-1.5 MG/5ML syrup  Supportive care.  Anticipatory guidance.  Advised she go home and rest, rather than work this afternoon.

## 2012-04-25 ENCOUNTER — Ambulatory Visit: Payer: 59

## 2012-04-25 ENCOUNTER — Ambulatory Visit (INDEPENDENT_AMBULATORY_CARE_PROVIDER_SITE_OTHER): Payer: 59 | Admitting: Emergency Medicine

## 2012-04-25 ENCOUNTER — Ambulatory Visit (HOSPITAL_COMMUNITY): Payer: 59

## 2012-04-25 VITALS — BP 142/88 | HR 110 | Temp 97.9°F | Resp 20 | Ht 67.0 in | Wt 135.0 lb

## 2012-04-25 DIAGNOSIS — J309 Allergic rhinitis, unspecified: Secondary | ICD-10-CM | POA: Insufficient documentation

## 2012-04-25 DIAGNOSIS — R109 Unspecified abdominal pain: Secondary | ICD-10-CM

## 2012-04-25 DIAGNOSIS — G43909 Migraine, unspecified, not intractable, without status migrainosus: Secondary | ICD-10-CM | POA: Insufficient documentation

## 2012-04-25 DIAGNOSIS — R350 Frequency of micturition: Secondary | ICD-10-CM

## 2012-04-25 DIAGNOSIS — M549 Dorsalgia, unspecified: Secondary | ICD-10-CM

## 2012-04-25 LAB — POCT CBC
Granulocyte percent: 45.3 %G (ref 37–80)
HCT, POC: 42.3 % (ref 37.7–47.9)
Hemoglobin: 13.1 g/dL (ref 12.2–16.2)
Lymph, poc: 3 (ref 0.6–3.4)
MCH, POC: 28.7 pg (ref 27–31.2)
MCHC: 31 g/dL — AB (ref 31.8–35.4)
MCV: 92.7 fL (ref 80–97)
MID (cbc): 0.4 (ref 0–0.9)
MPV: 8.1 fL (ref 0–99.8)
POC Granulocyte: 2.8 (ref 2–6.9)
POC LYMPH PERCENT: 48.6 %L (ref 10–50)
POC MID %: 6.1 %M (ref 0–12)
Platelet Count, POC: 211 10*3/uL (ref 142–424)
RBC: 4.56 M/uL (ref 4.04–5.48)
RDW, POC: 12.8 %
WBC: 6.1 10*3/uL (ref 4.6–10.2)

## 2012-04-25 LAB — POCT UA - MICROSCOPIC ONLY
Casts, Ur, LPF, POC: NEGATIVE
Crystals, Ur, HPF, POC: NEGATIVE
Mucus, UA: NEGATIVE
Yeast, UA: NEGATIVE

## 2012-04-25 LAB — POCT WET PREP WITH KOH
KOH Prep POC: NEGATIVE
Trichomonas, UA: NEGATIVE
WBC Wet Prep HPF POC: NEGATIVE
Yeast Wet Prep HPF POC: NEGATIVE

## 2012-04-25 LAB — POCT URINALYSIS DIPSTICK
Bilirubin, UA: NEGATIVE
Blood, UA: NEGATIVE
Glucose, UA: NEGATIVE
Ketones, UA: NEGATIVE
Leukocytes, UA: NEGATIVE
Nitrite, UA: NEGATIVE
Protein, UA: NEGATIVE
Spec Grav, UA: 1.015
Urobilinogen, UA: 0.2
pH, UA: 7

## 2012-04-25 LAB — POCT URINE PREGNANCY: Preg Test, Ur: NEGATIVE

## 2012-04-25 MED ORDER — MELOXICAM 15 MG PO TABS: 15.0000 mg | ORAL_TABLET | Freq: Every day | ORAL | Status: DC

## 2012-04-25 MED ORDER — IBUPROFEN 200 MG PO TABS
800.0000 mg | ORAL_TABLET | Freq: Once | ORAL | Status: AC
  Administered 2012-04-25: 800 mg via ORAL

## 2012-04-25 NOTE — Progress Notes (Signed)
Subjective:    Patient ID: Terri Wilson, female    DOB: 08-26-1985, 26 y.o.   MRN: [id number]  HPI  This 26 y.o. female presents for evaluation of severe back and abdominal pain.  She reports 2-3 weeks if intermittent pelvic/vaginal pain and urinary frequency.  She noted a strong, foul odor to the urine and that it was dark in color.  She denies any urgency or burning.  No hematuria.  No atypical vaginal discharge.  For the last few days, she's also experienced left-sided low back pain.  Today, while sitting at her desk at work, she developed sudden worsening of the pain, radiating through from her back to the low pelvis on the left, nausea, lightheadedness.  She left work in tears.  She is unable to get comfortable in any position, and the pain waxes and wanes in "waves."    She has a history of ovarian cysts, and has had a previous UTI, but this feels different.  She has no fever, chills, though she notes subjective hot-flashes. She experiences chronic constipation, typically has a BM only once weekly.  Past Medical History  Diagnosis Date  . Anxiety and depression-Bipolar Disorder   . Panic attack   . Herpes labialis   . Migraine   . Allergic rhinitis     Past Surgical History  Procedure Date  . Tonsillectomy and adenoidectomy 2001  . Wisdom tooth extraction     Prior to Admission medications   Medication Sig Start Date End Date Taking? Authorizing Provider  citalopram (CELEXA) 40 MG tablet Take 1 tablet (40 mg total) by mouth daily. 01/09/12  Yes Ryan Adria Devon, PA-C    Allergies  Allergen Reactions  . Lamictal (Lamotrigine) Anaphylaxis  . Vancomycin Other (See Comments)    Feels hot    History   Social History  . Marital Status: Married    Spouse Name: [place]    Number of Children: 0  . Years of Education: 14   Occupational History  . 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 Smoking status: Current Every Day Smoker -- 0.2 packs/day  for 6 years    Types: Cigarettes  . Smokeless tobacco: Never Used     Comment: down from 1 PPD  . Alcohol Use: No  . Drug Use: No  . Sexually Active: Yes -- Female partner(s)    Birth Control/ Protection: Coitus interruptus     Comment: "we're very careful." Does NOT want to be pregant.   Other Topics Concern  . Not on file   Social History Narrative   Lives with husband.    History reviewed. No pertinent family history.  Review of Systems     Objective:   Physical Exam Blood pressure 142/88, pulse 110, temperature 97.9 F (36.6 C), resp. rate 20, height 5\' 7"  (1.702 m), weight 135 lb (61.236 kg), last menstrual period 03/05/2012, SpO2 99.00%. Body mass index is 21.14 kg/(m^2). Well-developed, well nourished WF who is awake, alert and oriented, in tears, unable to be still. HEENT: [place]/AT, sclera and conjunctiva are clear.   Neck: supple, non-tender, no lymphadenopathy, thyromegaly. Heart: RRR, no murmur Lungs: normal effort, CTA Abdomen: normo-active bowel sounds, supple, no mass or organomegaly.Very tender in the subprapubic region and LLQ.  LEFT CVA tenderness. Extremities: no cyanosis, clubbing or edema. Skin: warm and dry without rash. Psychologic: good mood and appropriate affect, normal speech and behavior. Genitalia: refused pelvic exam, but self-collected a wet-prep.  Acute Abdominal Series: [employer] reading (PRIMARY) by  Dr. Dareen Piano.  Scoliosis.  Moderate stool.  No other abnormalities.  No free air, no ileus.  No air-fluid levels.  Results for orders placed in visit on 04/25/12  POCT UA - MICROSCOPIC ONLY      Component Value Range   WBC, Ur, HPF, POC 1-3     RBC, urine, microscopic 0-1     Bacteria, U Microscopic 1+     Mucus, UA neg     Epithelial cells, urine per micros 2-3     Crystals, Ur, HPF, POC neg     Casts, Ur, LPF, POC neg     Yeast, UA neg    POCT URINALYSIS DIPSTICK      Component Value Range   Color, UA yellow     Clarity, UA clear      Glucose, UA neg     Bilirubin, UA neg     Ketones, UA neg     Spec Grav, UA 1.015     Blood, UA neg     pH, UA 7.0     Protein, UA neg     Urobilinogen, UA 0.2     Nitrite, UA neg     Leukocytes, UA Negative    POCT URINE PREGNANCY      Component Value Range   Preg Test, Ur Negative    POCT CBC      Component Value Range   WBC 6.1  4.6 - 10.2 K/uL   Lymph, poc 3.0  0.6 - 3.4   POC LYMPH PERCENT 48.6  10 - 50 %L   MID (cbc) 0.4  0 - 0.9   POC MID % 6.1  0 - 12 %M   POC Granulocyte 2.8  2 - 6.9   Granulocyte percent 45.3  37 - 80 %G   RBC 4.56  4.04 - 5.48 M/uL   Hemoglobin 13.1  12.2 - 16.2 g/dL   HCT, POC 16.1  09.6 - 47.9 %   MCV 92.7  80 - 97 fL   MCH, POC 28.7  27 - 31.2 pg   MCHC 31.0 (*) 31.8 - 35.4 g/dL   RDW, POC 04.5     Platelet Count, POC 211  142 - 424 K/uL   MPV 8.1  0 - 99.8 fL  POCT WET PREP WITH KOH      Component Value Range   Trichomonas, UA Negative     Clue Cells Wet Prep HPF POC 2-5     Epithelial Wet Prep HPF POC 4-12     Yeast Wet Prep HPF POC negative     Bacteria Wet Prep HPF POC 3+     RBC Wet Prep HPF POC 0-1     WBC Wet Prep HPF POC negative     KOH Prep POC Negative        Assessment & Plan:   1. Back pain  POCT CBC, DG Abd Acute W/Chest, CT Abdomen Pelvis Wo Contrast, ibuprofen (ADVIL,MOTRIN) tablet 800 mg, meloxicam (MOBIC) 15 MG tablet  2. Urinary frequency  POCT UA - Microscopic Only, POCT urinalysis dipstick, POCT urine pregnancy, POCT CBC, Urine culture, POCT Wet Prep with KOH  3. Abdominal pain, acute  DG Abd Acute W/Chest, CT Abdomen Pelvis Wo Contrast   The CT was only partially authorized by insurance, and she already has a balance with radiology.  Since we're thinking that her pain is due to a kidney stone and/or constipation.  She's to  go home and rest.  Use Miralax to get her bowels moving and I've sent in meloxicam which will be less bothersome to her stomach.  Push fluids.  If her pain worsens, or if she becomes unable to  keep down liquids, or develops fever, she'll report to the ED.  Discussed with Dr. Dareen Piano.

## 2012-04-26 ENCOUNTER — Telehealth: Payer: Self-pay | Admitting: Physician Assistant

## 2012-04-26 MED ORDER — CIPROFLOXACIN HCL 250 MG PO TABS: 250.0000 mg | ORAL_TABLET | Freq: Two times a day (BID) | ORAL | Status: DC

## 2012-04-26 NOTE — Telephone Encounter (Signed)
The patient was seen yesterday with LEFT back/pelvic pain that had suddenly become very severe, and urinary frequency (present x several weeks).  UA in the the office was negative and CBC was WNL.  Acute abdominal series revealed constipation, but was otherwise normal.  She was unable to have the recommended CT due to cost.    She improved some yesterday evening and today, but has now developed urinary urgency and hematuria.  I Rx'd Cipro 250 mg 1 PO BID x 5 days.  Her mother will continue to update me on her status.

## 2012-04-27 LAB — URINE CULTURE: Colony Count: 5000

## 2012-04-28 ENCOUNTER — Telehealth: Payer: Self-pay | Admitting: Physician Assistant

## 2012-04-28 NOTE — Telephone Encounter (Signed)
Notified patient of NEGATIVE urine culture results.  RTC if symptoms persist, worsen.  Has developed a very congested nose/sinuses.  Has nasal spray and Mucinex at home to use.

## 2012-04-30 ENCOUNTER — Ambulatory Visit (INDEPENDENT_AMBULATORY_CARE_PROVIDER_SITE_OTHER): Payer: 59 | Admitting: Physician Assistant

## 2012-04-30 VITALS — BP 120/74 | HR 111 | Temp 99.0°F | Resp 16

## 2012-04-30 DIAGNOSIS — R300 Dysuria: Secondary | ICD-10-CM

## 2012-04-30 DIAGNOSIS — K5900 Constipation, unspecified: Secondary | ICD-10-CM

## 2012-04-30 DIAGNOSIS — R059 Cough, unspecified: Secondary | ICD-10-CM

## 2012-04-30 DIAGNOSIS — R05 Cough: Secondary | ICD-10-CM

## 2012-04-30 DIAGNOSIS — R509 Fever, unspecified: Secondary | ICD-10-CM

## 2012-04-30 LAB — POCT URINALYSIS DIPSTICK
Bilirubin, UA: NEGATIVE
Glucose, UA: NEGATIVE
Ketones, UA: NEGATIVE
Leukocytes, UA: NEGATIVE
Nitrite, UA: NEGATIVE
Protein, UA: NEGATIVE
Spec Grav, UA: 1.02
Urobilinogen, UA: 0.2
pH, UA: 6.5

## 2012-04-30 LAB — POCT UA - MICROSCOPIC ONLY
Bacteria, U Microscopic: NEGATIVE
Casts, Ur, LPF, POC: NEGATIVE
Crystals, Ur, HPF, POC: NEGATIVE
Mucus, UA: NEGATIVE
Yeast, UA: NEGATIVE

## 2012-04-30 LAB — POCT CBC
Granulocyte percent: 45.6 %G (ref 37–80)
HCT, POC: 42.9 % (ref 37.7–47.9)
Hemoglobin: 13.2 g/dL (ref 12.2–16.2)
Lymph, poc: 3.2 (ref 0.6–3.4)
MCH, POC: 28.6 pg (ref 27–31.2)
MCHC: 30.8 g/dL — AB (ref 31.8–35.4)
MCV: 93.1 fL (ref 80–97)
MID (cbc): 0.5 (ref 0–0.9)
MPV: 8.3 fL (ref 0–99.8)
POC Granulocyte: 3.1 (ref 2–6.9)
POC LYMPH PERCENT: 47 %L (ref 10–50)
POC MID %: 7.4 %M (ref 0–12)
Platelet Count, POC: 225 10*3/uL (ref 142–424)
RBC: 4.61 M/uL (ref 4.04–5.48)
RDW, POC: 11.9 %
WBC: 6.8 10*3/uL (ref 4.6–10.2)

## 2012-04-30 MED ORDER — ALBUTEROL SULFATE (2.5 MG/3ML) 0.083% IN NEBU
2.5000 mg | INHALATION_SOLUTION | Freq: Once | RESPIRATORY_TRACT | Status: AC
  Administered 2012-04-30: 2.5 mg via RESPIRATORY_TRACT

## 2012-04-30 MED ORDER — BENZONATATE 100 MG PO CAPS: 100.0000 mg | ORAL_CAPSULE | Freq: Three times a day (TID) | ORAL | Status: DC | PRN

## 2012-04-30 MED ORDER — IPRATROPIUM BROMIDE 0.02 % IN SOLN
0.5000 mg | Freq: Once | RESPIRATORY_TRACT | Status: AC
  Administered 2012-04-30: 0.5 mg via RESPIRATORY_TRACT

## 2012-04-30 MED ORDER — IPRATROPIUM BROMIDE 0.03 % NA SOLN: 2.0000 | Freq: Two times a day (BID) | NASAL | Status: DC

## 2012-04-30 MED ORDER — CLARITHROMYCIN ER 500 MG PO TB24: 1000.0000 mg | ORAL_TABLET | Freq: Every day | ORAL | Status: DC

## 2012-04-30 NOTE — Patient Instructions (Signed)
Get plenty of rest and drink at least 64 ounces of water daily. Increase the fiber in your diet.  Be physically active. Use a single dose of Miralax daily.

## 2012-04-30 NOTE — Progress Notes (Signed)
Subjective:    Patient ID: Terri Wilson, female    DOB: [birth year], 26 y.o.   MRN: [id number]  HPI  This 26 y.o. female presents for evaluation of persistent illness, after developing a fever of 101.4 while at work today.    Evaluation for same on 04/25/2012. From that visit: She reports 2-3 weeks if intermittent pelvic/vaginal pain and urinary frequency. She noted a strong, foul odor to the urine and that it was dark in color. She denies any urgency or burning. No hematuria. No atypical vaginal discharge. For the last few days, she's also experienced left-sided low back pain. Today, while sitting at her desk at work, she developed sudden worsening of the pain, radiating through from her back to the low pelvis on the left, nausea, lightheadedness. She left work in tears. She is unable to get comfortable in any position, and the pain waxes and wanes in "waves."  She has a history of ovarian cysts, and has had a previous UTI, but this feels different. She has no fever, chills, though she notes subjective hot-flashes. She experiences chronic constipation, typically has a BM only once weekly.  On 04/26/2012 she contacted me by phone with worsening urinary symptoms, and I sent in Cipro 250 mg 1 PO BID x 5 days.  The UCx results on 04/28/2012 revealed NO GROWTH.  Today she reports continued sensation of pressure in the bladder, worse at the end of urination and associated with a little bit of burning.  The urgency has improved a lot. No hematuria, no unusual vaginal discharge.  Her cough has worsened.  It is dry and she describes a sensation of razor blades in her throat when she coughs.  She describes a tickle deep in the center of her chest.  She also had very little results with Miralax and multiple enemas.  It has been 2 weeks since her last BM.   Past Medical History  Diagnosis Date  . Anxiety and depression   . Panic attack   . Herpes labialis   . Migraine   . Allergic rhinitis     Past  Surgical History  Procedure Date  . Tonsillectomy and adenoidectomy 2001  . Wisdom tooth extraction     Prior to Admission medications   Medication Sig Start Date End Date Taking? Authorizing Provider  ciprofloxacin (CIPRO) 250 MG tablet Take 1 tablet (250 mg total) by mouth 2 (two) times daily. 04/26/12  Yes Demtrius Rounds S Ladell Bey, PA-C  citalopram (CELEXA) 40 MG tablet Take 1 tablet (40 mg total) by mouth daily. 01/09/12  Yes Ryan M Dunn, PA-C  meloxicam (MOBIC) 15 MG tablet Take 1 tablet (15 mg total) by mouth daily. 04/25/12   Luisfernando Brightwell Tessa Lerner, PA-C    Allergies  Allergen Reactions  . Lamictal (Lamotrigine) Anaphylaxis  . Vancomycin Other (See Comments)    Feels hot    History   Social History  . Marital Status: Married    Spouse Name: [place]    Number of Children: 0  . Years of Education: 14   Occupational History  . BUSINESS ASSISTANT     [employer]   Social History Main Topics  . Smoking status: Current Every Day Smoker -- 0.2 packs/day for 6 years    Types: Cigarettes  . Smokeless tobacco: Never Used     Comment: down from 1 PPD  . Alcohol Use: No  . Drug Use: No  . Sexually Active: Yes -- Female partner(s)    Birth Control/ Protection:  Coitus interruptus     Comment: "we're very careful." Does NOT want to be pregant.   Other Topics Concern  . Not on file   Social History Narrative   Lives with husband.    No family history on file.  Review of Systems As above.    Objective:   Physical Exam Blood pressure 120/74, pulse 111, temperature 99 F (37.2 C), temperature source Oral, resp. rate 16, last menstrual period 03/05/2012, SpO2 97.00%. There is no height or weight on file to calculate BMI. Well-developed, well nourished WF who is awake, alert and oriented, in NAD. Accompanied by her mother. HEENT: [place]/AT, PERRL, EOMI.  Sclera and conjunctiva are clear.  EAC are patent, TMs are normal in appearance. Nasal mucosa is pink and moist. OP is clear. No sinus  tenderness on palpation. Neck: supple, non-tender, no lymphadenopathy, thyromegaly. Heart: RRR, no murmur Lungs: normal effort, CTA Abdomen: normo-active bowel sounds, supple, no mass or organomegaly. Some suprapubic tenderness. Extremities: no cyanosis, clubbing or edema. Skin: warm and dry without rash. Psychologic: good mood and appropriate affect, normal speech and behavior.  Results for orders placed in visit on 04/30/12  POCT CBC      Component Value Range   WBC 6.8  4.6 - 10.2 K/uL   Lymph, poc 3.2  0.6 - 3.4   POC LYMPH PERCENT 47.0  10 - 50 %L   MID (cbc) 0.5  0 - 0.9   POC MID % 7.4  0 - 12 %M   POC Granulocyte 3.1  2 - 6.9   Granulocyte percent 45.6  37 - 80 %G   RBC 4.61  4.04 - 5.48 M/uL   Hemoglobin 13.2  12.2 - 16.2 g/dL   HCT, POC 45.4  09.8 - 47.9 %   MCV 93.1  80 - 97 fL   MCH, POC 28.6  27 - 31.2 pg   MCHC 30.8 (*) 31.8 - 35.4 g/dL   RDW, POC 11.9     Platelet Count, POC 225  142 - 424 K/uL   MPV 8.3  0 - 99.8 fL  POCT UA - MICROSCOPIC ONLY      Component Value Range   WBC, Ur, HPF, POC 0-2     RBC, urine, microscopic 0-3     Bacteria, U Microscopic neg     Mucus, UA neg     Epithelial cells, urine per micros 0-4     Crystals, Ur, HPF, POC neg     Casts, Ur, LPF, POC neg     Yeast, UA neg    POCT URINALYSIS DIPSTICK      Component Value Range   Color, UA yellow     Clarity, UA turbin     Glucose, UA neg     Bilirubin, UA neg     Ketones, UA neg     Spec Grav, UA 1.020     Blood, UA small     pH, UA 6.5     Protein, UA neg     Urobilinogen, UA 0.2     Nitrite, UA neg     Leukocytes, UA Negative     Acute Abdominal Series on 04/25/2012 reviewed.    Assessment & Plan:   1. Fever  POCT CBC  2. Cough  Suspect sinobronchitis; albuterol (PROVENTIL) (2.5 MG/3ML) 0.083% nebulizer solution 2.5 mg, ipratropium (ATROVENT) nebulizer solution 0.5 mg, ipratropium (ATROVENT) 0.03 % nasal spray, benzonatate (TESSALON) 100 MG capsule, clarithromycin (BIAXIN  XL) 500 MG 24 hr tablet  3. Dysuria  POCT CBC, POCT UA - Microscopic Only, POCT urinalysis dipstick; D/C Cipro given negative UCX.  4. Constipation  I suspect that she did have some results, but it's unclear how much stool remains in her rectum.  Supportive care for now in this regard.  RTC 48 hours if symptoms not much better, sooner if needed.

## 2012-05-29 ENCOUNTER — Ambulatory Visit (INDEPENDENT_AMBULATORY_CARE_PROVIDER_SITE_OTHER): Payer: 59 | Admitting: Family Medicine

## 2012-05-29 ENCOUNTER — Encounter: Payer: Self-pay | Admitting: Family Medicine

## 2012-05-29 VITALS — BP 117/78 | HR 118 | Temp 98.1°F | Resp 20

## 2012-05-29 DIAGNOSIS — R112 Nausea with vomiting, unspecified: Secondary | ICD-10-CM

## 2012-05-29 DIAGNOSIS — J069 Acute upper respiratory infection, unspecified: Secondary | ICD-10-CM

## 2012-05-29 DIAGNOSIS — R52 Pain, unspecified: Secondary | ICD-10-CM

## 2012-05-29 DIAGNOSIS — J029 Acute pharyngitis, unspecified: Secondary | ICD-10-CM

## 2012-05-29 LAB — POCT CBC
Granulocyte percent: 33.5 %G — AB (ref 37–80)
HCT, POC: 40.3 % (ref 37.7–47.9)
Hemoglobin: 12.9 g/dL (ref 12.2–16.2)
Lymph, poc: 4.4 — AB (ref 0.6–3.4)
MCH, POC: 29.6 pg (ref 27–31.2)
MCHC: 32 g/dL (ref 31.8–35.4)
MCV: 92.5 fL (ref 80–97)
MID (cbc): 0.5 (ref 0–0.9)
MPV: 7.2 fL (ref 0–99.8)
POC Granulocyte: 2.4 (ref 2–6.9)
POC LYMPH PERCENT: 59.9 %L — AB (ref 10–50)
POC MID %: 6.6 %M (ref 0–12)
Platelet Count, POC: 216 10*3/uL (ref 142–424)
RBC: 4.36 M/uL (ref 4.04–5.48)
RDW, POC: 13.7 %
WBC: 7.3 10*3/uL (ref 4.6–10.2)

## 2012-05-29 LAB — POCT INFLUENZA A/B
Influenza A, POC: NEGATIVE
Influenza B, POC: NEGATIVE

## 2012-05-29 LAB — POCT UA - MICROSCOPIC ONLY
Casts, Ur, LPF, POC: NEGATIVE
Crystals, Ur, HPF, POC: NEGATIVE
Mucus, UA: NEGATIVE
RBC, urine, microscopic: NEGATIVE
Yeast, UA: NEGATIVE

## 2012-05-29 LAB — POCT RAPID STREP A (OFFICE): Rapid Strep A Screen: NEGATIVE

## 2012-05-29 MED ORDER — PROMETHAZINE HCL 25 MG/ML IJ SOLN
25.0000 mg | Freq: Four times a day (QID) | INTRAMUSCULAR | Status: DC | PRN
  Administered 2012-05-29: 25 mg via INTRAMUSCULAR

## 2012-05-29 MED ORDER — PROMETHAZINE HCL 25 MG PO TABS: 25.0000 mg | ORAL_TABLET | Freq: Four times a day (QID) | ORAL | Status: DC | PRN

## 2012-05-29 NOTE — Progress Notes (Signed)
[address]   [place], [place]  [address]   [phone]  Subjective:    Patient ID: Terri Wilson, female    DOB: Apr 16, 1986, 27 y.o.   MRN: [id number] . HPIThis 28 y.o. female presents for evaluation of nausea and vomiting. Acute onset of vomiting within past hour while working.   Working at [employer]; came in at 12:30 this afternoon.  +chills/sweats all day.  +HA severe all day.  Never checked fever.  Started vomiting 30 minutes ago.  Vomited x 25 since starting; continuous vomiting for 30 minutes.  Vomit brown; a lot in bucket.  Non-bloody.  No diarrhea.  +abdominal pain cramping.  Mild sore throat.  Mild rhinorrhea; mild cough today.  Pain with swallowing after vomiting so much.  No rash.  Coworker has the flu.  S/p flu vaccine.  No GI sick contacts.  +achy; feels like been hit by a truck.  LMP one week ago; normal.   Bad neck and back pain; severe headache.  +photophobia. Mild phonophobia.     Review of Systems  Constitutional: Positive for fever, chills and fatigue.  HENT: Positive for congestion, sore throat and rhinorrhea. Negative for ear pain and sinus pressure.   Respiratory: Positive for cough. Negative for shortness of breath.   Gastrointestinal: Positive for nausea, vomiting and abdominal pain. Negative for diarrhea.  Skin: Negative for rash.  Neurological: Positive for headaches. Negative for dizziness and light-headedness.        Past Medical History  Diagnosis Date  . Anxiety and depression   . Panic attack   . Herpes labialis   . Migraine   . Allergic rhinitis     Past Surgical History  Procedure Date  . Tonsillectomy and adenoidectomy 2001  . Wisdom tooth extraction     Prior to Admission medications   Medication Sig Start Date End Date Taking? Authorizing Provider  benzonatate (TESSALON) 100 MG capsule Take 1-2 capsules (100-200 mg total) by mouth 3 (three) times daily as needed for cough. 04/30/12   Chelle S Jeffery, PA-C  citalopram (CELEXA) 40  MG tablet Take 1 tablet (40 mg total) by mouth daily. 01/09/12   Sondra Barges, PA-C  clarithromycin (BIAXIN XL) 500 MG 24 hr tablet Take 2 tablets (1,000 mg total) by mouth daily. 04/30/12   Chelle S Jeffery, PA-C  ipratropium (ATROVENT) 0.03 % nasal spray Place 2 sprays into the nose 2 (two) times daily. 04/30/12   Chelle Tessa Lerner, PA-C  meloxicam (MOBIC) 15 MG tablet Take 1 tablet (15 mg total) by mouth daily. 04/25/12   Chelle S Jeffery, PA-C  promethazine (PHENERGAN) 25 MG tablet Take 1 tablet (25 mg total) by mouth every 6 (six) hours as needed for nausea. 05/29/12   Ethelda Chick, MD    Allergies  Allergen Reactions  . Lamictal (Lamotrigine) Anaphylaxis  . Vancomycin Other (See Comments)    Feels hot    History   Social History  . Marital Status: Married    Spouse Name: [place]    Number of Children: 0  . Years of Education: 14   Occupational History  . 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 Smoking status: Current Every Day Smoker -- 0.2 packs/day for 6 years    Types: Cigarettes  . Smokeless tobacco: Never Used     Comment: down from 1 PPD  . Alcohol Use: No  . Drug Use: No  . Sexually Active:  Yes -- Female partner(s)    Birth Control/ Protection: Coitus interruptus     Comment: "we're very careful." Does NOT want to be pregant.   Other Topics Concern  . Not on file   Social History Narrative   Lives with husband.    No family history on file.  Objective:   Physical Exam  Nursing note and vitals reviewed. Constitutional: She is oriented to person, place, and time. She appears well-developed and well-nourished. No distress.  HENT:  Head: Normocephalic and atraumatic.  Right Ear: External ear normal.  Left Ear: External ear normal.  Nose: Nose normal.  Mouth/Throat: Posterior oropharyngeal erythema present. No oropharyngeal exudate.  Eyes: Conjunctivae normal and EOM are normal. Pupils are equal, round, and reactive to light.    Neck: Normal range of motion. Neck supple. No thyromegaly present.  Cardiovascular: Normal rate and regular rhythm.   No murmur heard. Pulmonary/Chest: Effort normal and breath sounds normal.  Abdominal: Soft. Bowel sounds are normal. She exhibits no distension and no mass. There is no tenderness. There is no rebound and no guarding.  Lymphadenopathy:    She has cervical adenopathy.  Neurological: She is alert and oriented to person, place, and time. No cranial nerve deficit. Coordination normal.       No nuchal rigidity.  Skin: Skin is warm and dry. No rash noted. She is not diaphoretic.  Psychiatric: She has a normal mood and affect. Her behavior is normal.       Results for orders placed in visit on 05/29/12  POCT CBC      Component Value Range   WBC 7.3  4.6 - 10.2 K/uL   Lymph, poc 4.4 (*) 0.6 - 3.4   POC LYMPH PERCENT 59.9 (*) 10 - 50 %L   MID (cbc) 0.5  0 - 0.9   POC MID % 6.6  0 - 12 %M   POC Granulocyte 2.4  2 - 6.9   Granulocyte percent 33.5 (*) 37 - 80 %G   RBC 4.36  4.04 - 5.48 M/uL   Hemoglobin 12.9  12.2 - 16.2 g/dL   HCT, POC 54.0  98.1 - 47.9 %   MCV 92.5  80 - 97 fL   MCH, POC 29.6  27 - 31.2 pg   MCHC 32.0  31.8 - 35.4 g/dL   RDW, POC 19.1     Platelet Count, POC 216  142 - 424 K/uL   MPV 7.2  0 - 99.8 fL  POCT INFLUENZA A/B      Component Value Range   Influenza A, POC Negative     Influenza B, POC Negative    POCT RAPID STREP A (OFFICE)      Component Value Range   Rapid Strep A Screen Negative  Negative  POCT UA - MICROSCOPIC ONLY      Component Value Range   WBC, Ur, HPF, POC 1-4     RBC, urine, microscopic neg     Bacteria, U Microscopic 3+     Mucus, UA neg     Epithelial cells, urine per micros 7-12     Crystals, Ur, HPF, POC neg     Casts, Ur, LPF, POC neg     Yeast, UA neg     PHENERGAN 25MG  IM ADMINISTERED.    Assessment & Plan:   1. Nausea & vomiting  promethazine (PHENERGAN) injection 25 mg, POCT CBC, POCT Influenza A/B, POCT  rapid strep A, POCT UA - Microscopic Only, Comprehensive metabolic panel,  Urine culture  2. Body aches  Urine culture  3. URI (upper respiratory infection)  Culture, Group A Strep     1. Gastroenteritis:  New.  Consistent with viral etiology.  S/p Phenergan IM in office. Rx for Phenergan provided.  BRAT diet, hydration. RTC inability to drink fluids.  OOW note provided for 05/30/12.  Meds ordered this encounter  Medications  . promethazine (PHENERGAN) injection 25 mg    Sig:   . promethazine (PHENERGAN) 25 MG tablet    Sig: Take 1 tablet (25 mg total) by mouth every 6 (six) hours as needed for nausea.    Dispense:  20 tablet    Refill:  0

## 2012-05-30 LAB — COMPREHENSIVE METABOLIC PANEL
ALT: 10 U/L (ref 0–35)
AST: 15 U/L (ref 0–37)
Albumin: 4.4 g/dL (ref 3.5–5.2)
Alkaline Phosphatase: 62 U/L (ref 39–117)
BUN: 9 mg/dL (ref 6–23)
CO2: 29 mEq/L (ref 19–32)
Calcium: 9.4 mg/dL (ref 8.4–10.5)
Chloride: 104 mEq/L (ref 96–112)
Creat: 0.73 mg/dL (ref 0.50–1.10)
Glucose, Bld: 104 mg/dL — ABNORMAL HIGH (ref 70–99)
Potassium: 3.7 mEq/L (ref 3.5–5.3)
Sodium: 138 mEq/L (ref 135–145)
Total Bilirubin: 0.3 mg/dL (ref 0.3–1.2)
Total Protein: 6.8 g/dL (ref 6.0–8.3)

## 2012-06-01 LAB — CULTURE, GROUP A STREP: Organism ID, Bacteria: NORMAL

## 2012-06-01 LAB — URINE CULTURE
Colony Count: NO GROWTH
Organism ID, Bacteria: NO GROWTH

## 2012-06-23 ENCOUNTER — Ambulatory Visit (INDEPENDENT_AMBULATORY_CARE_PROVIDER_SITE_OTHER): Payer: 59 | Admitting: Emergency Medicine

## 2012-06-23 VITALS — BP 106/69 | HR 84 | Temp 97.7°F | Resp 16 | Ht 68.0 in | Wt 142.0 lb

## 2012-06-23 DIAGNOSIS — S00419A Abrasion of unspecified ear, initial encounter: Secondary | ICD-10-CM

## 2012-06-23 DIAGNOSIS — IMO0002 Reserved for concepts with insufficient information to code with codable children: Secondary | ICD-10-CM

## 2012-06-23 NOTE — Progress Notes (Signed)
Reviewed and agree.

## 2012-06-23 NOTE — Progress Notes (Signed)
[facility] [address], [place] [address] [phone]  Date:  06/23/2012   Name:  Terri Wilson   DOB:  November 23, 1985   MRN:  [id number]  PCP:  Carola Frost    Chief Complaint: Foreign Body in Ear   History of Present Illness:  Terri Wilson is a 27 y.o. very pleasant female patient who presents with the following:  Cleaned ear with qtip and head left behind.  Attempted to remove FB with a bobby pin and the pin abraded her external canal  Patient Active Problem List  Diagnosis  . Anxiety and depression  . Panic attack  . Migraine  . AR (allergic rhinitis)    Past Medical History  Diagnosis Date  . Anxiety and depression   . Panic attack   . Herpes labialis   . Migraine   . Allergic rhinitis     Past Surgical History  Procedure Date  . Tonsillectomy and adenoidectomy 2001  . Wisdom tooth extraction     History  Substance Use Topics  . Smoking status: Current Every Day Smoker -- 0.2 packs/day for 6 years    Types: Cigarettes  . Smokeless tobacco: Never Used     Comment: down from 1 PPD  . Alcohol Use: No    No family history on file.  Allergies  Allergen Reactions  . Lamictal (Lamotrigine) Anaphylaxis  . Vancomycin Other (See Comments)    Feels hot    Medication list has been reviewed and updated.  Current Outpatient Prescriptions on File Prior to Visit  Medication Sig Dispense Refill  . citalopram (CELEXA) 40 MG tablet Take 1 tablet (40 mg total) by mouth daily.  30 tablet  0  . benzonatate (TESSALON) 100 MG capsule Take 1-2 capsules (100-200 mg total) by mouth 3 (three) times daily as needed for cough.  40 capsule  0  . clarithromycin (BIAXIN XL) 500 MG 24 hr tablet Take 2 tablets (1,000 mg total) by mouth daily.  20 tablet  0  . ipratropium (ATROVENT) 0.03 % nasal spray Place 2 sprays into the nose 2 (two) times daily.  30 mL  5  . meloxicam (MOBIC) 15 MG tablet Take 1 tablet (15 mg total) by mouth daily.  30  tablet  1  . promethazine (PHENERGAN) 25 MG tablet Take 1 tablet (25 mg total) by mouth every 6 (six) hours as needed for nausea.  20 tablet  0    Review of Systems:  As per HPI, otherwise negative.    Physical Examination: Filed Vitals:   06/23/12 1357  BP: 106/69  Pulse: 84  Temp: 97.7 F (36.5 C)  Resp: 16   Filed Vitals:   06/23/12 1357  Height: 5\' 8"  (1.727 m)  Weight: 142 lb (64.411 kg)   Body mass index is 21.59 kg/(m^2). Ideal Body Weight: Weight in (lb) to have BMI = 25: 164.1    GEN: WDWN, NAD, Non-toxic, Alert & Oriented x 3 HEENT: Atraumatic, Normocephalic.  Ears and Nose: No external deformity.  TM clear bilaterally.  Blood in left external ear canal.  No FB EXTR: No clubbing/cyanosis/edema NEURO: Normal gait.  PSYCH: Normally interactive. Conversant. Not depressed or anxious appearing.  Calm demeanor.    Assessment and Plan: Abrasion external ear canal Follow up as needed  Carmelina Dane, MD

## 2012-07-09 ENCOUNTER — Other Ambulatory Visit: Payer: Self-pay | Admitting: Physician Assistant

## 2012-07-09 NOTE — Telephone Encounter (Signed)
Ext [phone] patient requests 1 month supply of her celexa until she can come in for office visit. Please advise.

## 2012-07-10 ENCOUNTER — Other Ambulatory Visit: Payer: Self-pay | Admitting: Radiology

## 2012-08-13 NOTE — Progress Notes (Signed)
Reviewed and agree.

## 2012-08-24 ENCOUNTER — Ambulatory Visit (INDEPENDENT_AMBULATORY_CARE_PROVIDER_SITE_OTHER): Payer: 59 | Admitting: Emergency Medicine

## 2012-08-24 VITALS — BP 107/71 | HR 94 | Temp 98.2°F | Resp 16 | Ht 67.0 in | Wt 140.0 lb

## 2012-08-24 DIAGNOSIS — F32A Depression, unspecified: Secondary | ICD-10-CM

## 2012-08-24 DIAGNOSIS — F329 Major depressive disorder, single episode, unspecified: Secondary | ICD-10-CM

## 2012-08-24 DIAGNOSIS — F419 Anxiety disorder, unspecified: Secondary | ICD-10-CM

## 2012-08-24 DIAGNOSIS — F341 Dysthymic disorder: Secondary | ICD-10-CM

## 2012-08-24 MED ORDER — CITALOPRAM HYDROBROMIDE 40 MG PO TABS: 40.0000 mg | ORAL_TABLET | Freq: Every day | ORAL | Status: DC

## 2012-08-24 NOTE — Progress Notes (Signed)
[facility] [address] [place] [address] [phone]  Date:  08/24/2012   Name:  Terri Wilson   DOB:  Dec 24, 1985   MRN:  [id number]  PCP:  Carola Frost    Chief Complaint: Medication Refill   History of Present Illness:  Terri Wilson is a 27 y.o. very pleasant female patient who presents with the following:  Needs refill on celexa.  Symptoms under adequate control with no adverse effects of treatment.  Denies other complaint or health concern today.   Patient Active Problem List  Diagnosis  . Anxiety and depression  . Panic attack  . Migraine  . AR (allergic rhinitis)    Past Medical History  Diagnosis Date  . Anxiety and depression   . Panic attack   . Herpes labialis   . Migraine   . Allergic rhinitis     Past Surgical History  Procedure Laterality Date  . Tonsillectomy and adenoidectomy  2001  . Wisdom tooth extraction      History  Substance Use Topics  . Smoking status: Current Every Day Smoker -- 0.20 packs/day for 6 years    Types: Cigarettes  . Smokeless tobacco: Never Used     Comment: down from 1 PPD  . Alcohol Use: No    No family history on file.  Allergies  Allergen Reactions  . Lamictal (Lamotrigine) Anaphylaxis  . Vancomycin Other (See Comments)    Feels hot    Medication list has been reviewed and updated.  Current Outpatient Prescriptions on File Prior to Visit  Medication Sig Dispense Refill  . citalopram (CELEXA) 40 MG tablet TAKE 1 TABLET BY MOUTH ONCE DAILY  30 tablet  0  . benzonatate (TESSALON) 100 MG capsule Take 1-2 capsules (100-200 mg total) by mouth 3 (three) times daily as needed for cough.  40 capsule  0  . clarithromycin (BIAXIN XL) 500 MG 24 hr tablet Take 2 tablets (1,000 mg total) by mouth daily.  20 tablet  0  . ipratropium (ATROVENT) 0.03 % nasal spray Place 2 sprays into the nose 2 (two) times daily.  30 mL  5  . meloxicam (MOBIC) 15 MG tablet Take 1 tablet (15 mg total)  by mouth daily.  30 tablet  1  . promethazine (PHENERGAN) 25 MG tablet Take 1 tablet (25 mg total) by mouth every 6 (six) hours as needed for nausea.  20 tablet  0   No current facility-administered medications on file prior to visit.    Review of Systems:  As per HPI, otherwise negative.    Physical Examination: Filed Vitals:   08/24/12 1250  BP: 107/71  Pulse: 94  Temp: 98.2 F (36.8 C)  Resp: 16   Filed Vitals:   08/24/12 1250  Height: 5\' 7"  (1.702 m)  Weight: 140 lb (63.504 kg)   Body mass index is 21.92 kg/(m^2). Ideal Body Weight: Weight in (lb) to have BMI = 25: 159.3   GEN: WDWN, NAD, Non-toxic, Alert & Oriented x 3 HEENT: Atraumatic, Normocephalic.  Ears and Nose: No external deformity. EXTR: No clubbing/cyanosis/edema NEURO: Normal gait.  PSYCH: Normally interactive. Conversant. Not depressed or anxious appearing.  Calm demeanor.    Assessment and Plan: Anxiety Refill celexa   Signed,  Phillips Odor, MD

## 2012-08-24 NOTE — Addendum Note (Signed)
Addended by: Carmelina Dane on: 08/24/2012 01:05 PM   Modules accepted: Level of Service

## 2012-08-25 NOTE — Progress Notes (Signed)
Reviewed and agree.

## 2012-08-28 ENCOUNTER — Ambulatory Visit (INDEPENDENT_AMBULATORY_CARE_PROVIDER_SITE_OTHER): Payer: 59 | Admitting: Physician Assistant

## 2012-08-28 VITALS — BP 103/72 | HR 102 | Temp 97.8°F | Resp 16 | Ht 67.0 in | Wt 140.0 lb

## 2012-08-28 DIAGNOSIS — K0889 Other specified disorders of teeth and supporting structures: Secondary | ICD-10-CM

## 2012-08-28 DIAGNOSIS — K089 Disorder of teeth and supporting structures, unspecified: Secondary | ICD-10-CM

## 2012-08-28 LAB — POCT CBC
Granulocyte percent: 59.6 %G (ref 37–80)
HCT, POC: 37.7 % (ref 37.7–47.9)
Hemoglobin: 11.8 g/dL — AB (ref 12.2–16.2)
Lymph, poc: 2.4 (ref 0.6–3.4)
MCH, POC: 28.6 pg (ref 27–31.2)
MCHC: 31.3 g/dL — AB (ref 31.8–35.4)
MCV: 91.4 fL (ref 80–97)
MID (cbc): 0.5 (ref 0–0.9)
MPV: 8.1 fL (ref 0–99.8)
POC Granulocyte: 4.2 (ref 2–6.9)
POC LYMPH PERCENT: 33.9 %L (ref 10–50)
POC MID %: 6.5 %M (ref 0–12)
Platelet Count, POC: 213 10*3/uL (ref 142–424)
RBC: 4.13 M/uL (ref 4.04–5.48)
RDW, POC: 12.6 %
WBC: 7.1 10*3/uL (ref 4.6–10.2)

## 2012-08-28 MED ORDER — OXYCODONE-ACETAMINOPHEN 5-325 MG PO TABS: 1.0000 | ORAL_TABLET | Freq: Three times a day (TID) | ORAL | Status: DC | PRN

## 2012-08-28 MED ORDER — KETOROLAC TROMETHAMINE 60 MG/2ML IM SOLN
60.0000 mg | Freq: Once | INTRAMUSCULAR | Status: AC
  Administered 2012-08-28: 60 mg via INTRAMUSCULAR

## 2012-08-28 NOTE — Progress Notes (Signed)
   [address], [place] [address]   Phone [phone]  Subjective:    Patient ID: Terri Wilson, female    DOB: 1985-11-30, 27 y.o.   MRN: [id number]  HPI Pt had a completed root canal yesterday.  The process was started 1 month ago.  She is currently on Amoxil and Clindamycin.  She has had these before and they have not hurt this bad. She has a temporary cap in place.  She called their office today and they told her to take high dose Motrin and her ultram because she has not tolerated vicodin in the past.  She took 2 doses of both of those meds and now she feels terrible and the pain is not improved.  She is dizzy and in extreme pain.    Motrin 800mg   q6h for 2 doses Ultram 50 2 q6h for 2 doses   Review of Systems  Constitutional: Negative for fever and chills.  HENT: Positive for dental problem.        Objective:   Physical Exam  Constitutional: She is oriented to person, place, and time.  Pt looks in discomfort.  Bag on ice on her lower jaw.  HENT:  Head: Normocephalic and atraumatic.  Right Ear: External ear normal.  Left Ear: External ear normal.  Pulmonary/Chest: Effort normal.  Neurological: She is alert and oriented to person, place, and time.  Skin: Skin is warm and dry.  Psychiatric: She has a normal mood and affect. Her behavior is normal. Judgment and thought content normal.       Assessment & Plan:  Pain, dental - Plan: ketorolac (TORADOL) injection 60 mg, POCT CBC, oxyCODONE-acetaminophen (ROXICET) 5-325 MG per tablet  I [facility] and was told to call her surgeon and get his number from the phone book. I lookup his name and could not find his home phone number on the internet.  We will treat the patients pain with Percocet and she will contact their office in the am.  Patient got some relief from the Toradol injection I called patient at 8pm and she was doing some better.

## 2012-11-16 ENCOUNTER — Encounter (HOSPITAL_COMMUNITY): Payer: Self-pay | Admitting: Emergency Medicine

## 2012-11-16 ENCOUNTER — Emergency Department (HOSPITAL_COMMUNITY): Payer: 59

## 2012-11-16 ENCOUNTER — Encounter: Payer: Self-pay | Admitting: Family Medicine

## 2012-11-16 ENCOUNTER — Emergency Department (HOSPITAL_COMMUNITY)
Admission: EM | Admit: 2012-11-16 | Discharge: 2012-11-16 | Disposition: A | Discharge status: Home / Self Care | Payer: 59 | Attending: Emergency Medicine | Admitting: Emergency Medicine

## 2012-11-16 ENCOUNTER — Ambulatory Visit (INDEPENDENT_AMBULATORY_CARE_PROVIDER_SITE_OTHER): Payer: 59 | Admitting: Family Medicine

## 2012-11-16 VITALS — BP 110/68 | HR 79 | Temp 98.1°F | Resp 16

## 2012-11-16 DIAGNOSIS — Y929 Unspecified place or not applicable: Secondary | ICD-10-CM | POA: Insufficient documentation

## 2012-11-16 DIAGNOSIS — S060X0A Concussion without loss of consciousness, initial encounter: Secondary | ICD-10-CM | POA: Insufficient documentation

## 2012-11-16 DIAGNOSIS — Z8619 Personal history of other infectious and parasitic diseases: Secondary | ICD-10-CM | POA: Insufficient documentation

## 2012-11-16 DIAGNOSIS — Y9389 Activity, other specified: Secondary | ICD-10-CM | POA: Insufficient documentation

## 2012-11-16 DIAGNOSIS — S139XXA Sprain of joints and ligaments of unspecified parts of neck, initial encounter: Secondary | ICD-10-CM | POA: Insufficient documentation

## 2012-11-16 DIAGNOSIS — Z8709 Personal history of other diseases of the respiratory system: Secondary | ICD-10-CM | POA: Insufficient documentation

## 2012-11-16 DIAGNOSIS — R5381 Other malaise: Secondary | ICD-10-CM | POA: Insufficient documentation

## 2012-11-16 DIAGNOSIS — R200 Anesthesia of skin: Secondary | ICD-10-CM

## 2012-11-16 DIAGNOSIS — Z8679 Personal history of other diseases of the circulatory system: Secondary | ICD-10-CM | POA: Insufficient documentation

## 2012-11-16 DIAGNOSIS — Z8659 Personal history of other mental and behavioral disorders: Secondary | ICD-10-CM | POA: Insufficient documentation

## 2012-11-16 DIAGNOSIS — R209 Unspecified disturbances of skin sensation: Secondary | ICD-10-CM

## 2012-11-16 DIAGNOSIS — F17200 Nicotine dependence, unspecified, uncomplicated: Secondary | ICD-10-CM | POA: Insufficient documentation

## 2012-11-16 DIAGNOSIS — R29898 Other symptoms and signs involving the musculoskeletal system: Secondary | ICD-10-CM

## 2012-11-16 DIAGNOSIS — R5383 Other fatigue: Secondary | ICD-10-CM | POA: Insufficient documentation

## 2012-11-16 DIAGNOSIS — IMO0002 Reserved for concepts with insufficient information to code with codable children: Secondary | ICD-10-CM | POA: Insufficient documentation

## 2012-11-16 DIAGNOSIS — M542 Cervicalgia: Secondary | ICD-10-CM

## 2012-11-16 DIAGNOSIS — Z3202 Encounter for pregnancy test, result negative: Secondary | ICD-10-CM | POA: Insufficient documentation

## 2012-11-16 DIAGNOSIS — M62838 Other muscle spasm: Secondary | ICD-10-CM

## 2012-11-16 LAB — URINALYSIS, DIPSTICK ONLY
Bilirubin Urine: NEGATIVE
Glucose, UA: NEGATIVE mg/dL
Hgb urine dipstick: NEGATIVE
Ketones, ur: NEGATIVE mg/dL
Leukocytes, UA: NEGATIVE
Nitrite: NEGATIVE
Protein, ur: NEGATIVE mg/dL
Specific Gravity, Urine: 1.023 (ref 1.005–1.030)
Urobilinogen, UA: 0.2 mg/dL (ref 0.0–1.0)
pH: 7.5 (ref 5.0–8.0)

## 2012-11-16 LAB — POCT PREGNANCY, URINE: Preg Test, Ur: NEGATIVE

## 2012-11-16 MED ORDER — ONDANSETRON 4 MG PO TBDP: 4.0000 mg | ORAL_TABLET | Freq: Three times a day (TID) | ORAL | Status: DC | PRN

## 2012-11-16 MED ORDER — OXYCODONE-ACETAMINOPHEN 5-325 MG PO TABS
1.0000 | ORAL_TABLET | Freq: Once | ORAL | Status: AC
  Administered 2012-11-16: 1 via ORAL
  Filled 2012-11-16: qty 1

## 2012-11-16 MED ORDER — OXYCODONE-ACETAMINOPHEN 5-325 MG PO TABS: 1.0000 | ORAL_TABLET | ORAL | Status: DC | PRN

## 2012-11-16 MED ORDER — ONDANSETRON 4 MG PO TBDP
4.0000 mg | ORAL_TABLET | Freq: Once | ORAL | Status: AC
  Administered 2012-11-16: 4 mg via ORAL
  Filled 2012-11-16: qty 1

## 2012-11-16 MED ORDER — CYCLOBENZAPRINE HCL 10 MG PO TABS: 10.0000 mg | ORAL_TABLET | Freq: Two times a day (BID) | ORAL | Status: DC | PRN

## 2012-11-16 MED ORDER — OXYCODONE-ACETAMINOPHEN 5-325 MG PO TABS: 2.0000 | ORAL_TABLET | Freq: Once | ORAL | Status: DC

## 2012-11-16 NOTE — Patient Instructions (Signed)
Evaluation of neck pain at ER, but if no fracture, can follow up to discuss strain and spasm treatment if needed.

## 2012-11-16 NOTE — ED Notes (Signed)
Pt in route to MRI at this time.

## 2012-11-16 NOTE — ED Notes (Signed)
Patient transported to X-ray 

## 2012-11-16 NOTE — ED Notes (Signed)
Pt from [place] fo neck pain. Pt was tubing, describes hitting rough water and neck whipping back and forth. Sent here for further eval. Received 800mg  motrin with relief pta.

## 2012-11-16 NOTE — ED Provider Notes (Signed)
History    CSN: [id number] Arrival date & time 11/16/12  1501  None    Chief Complaint  Patient presents with  . Neck Pain   (Consider location/radiation/quality/duration/timing/severity/associated sxs/prior Treatment) Patient is a 27 y.o. female presenting with neck injury.  Neck Injury This is a new problem. The current episode started today. The problem occurs constantly. The problem has been unchanged. Associated symptoms include weakness (bil arms). Pertinent negatives include no abdominal pain, chest pain, chills, congestion, coughing, fever, nausea, numbness, rash, sore throat or vomiting. The symptoms are aggravated by bending, twisting, walking and exertion. She has tried nothing for the symptoms.   Past Medical History  Diagnosis Date  . Anxiety and depression   . Panic attack   . Herpes labialis   . Migraine   . Allergic rhinitis    Past Surgical History  Procedure Laterality Date  . Tonsillectomy and adenoidectomy  2001  . Wisdom tooth extraction     History reviewed. No pertinent family history. History  Substance Use Topics  . Smoking status: Current Every Day Smoker -- 0.20 packs/day for 6 years    Types: Cigarettes  . Smokeless tobacco: Never Used     Comment: down from 1 PPD  . Alcohol Use: No   OB History   Grav Para Term Preterm Abortions TAB SAB Ect Mult Living   0 0 0 0 0 0 0 0 0 0      Review of Systems  Constitutional: Negative for fever and chills.  HENT: Negative for congestion, sore throat and rhinorrhea.   Eyes: Negative for photophobia and visual disturbance.  Respiratory: Negative for cough and shortness of breath.   Cardiovascular: Negative for chest pain and leg swelling.  Gastrointestinal: Negative for nausea, vomiting, abdominal pain, diarrhea and constipation.  Endocrine: Negative for polyphagia and polyuria.  Genitourinary: Negative for dysuria, flank pain, vaginal bleeding, vaginal discharge and enuresis.  Musculoskeletal:  Negative for back pain and gait problem.  Skin: Negative for color change and rash.  Neurological: Positive for weakness (bil arms). Negative for dizziness, syncope, light-headedness and numbness.  Hematological: Negative for adenopathy. Does not bruise/bleed easily.  All other systems reviewed and are negative.    Allergies  Lamictal and Vancomycin  Home Medications   Current Outpatient Rx  Name  Route  Sig  Dispense  Refill  . folic acid ([employer] FOLIC ACID) 800 MCG tablet   Oral   Take 800 mcg by mouth at bedtime.         . Prenatal Vit-Fe Fumarate-FA (MULTIVITAMIN-PRENATAL) 27-0.8 MG TABS   Oral   Take 1 tablet by mouth at bedtime.         . cyclobenzaprine (FLEXERIL) 10 MG tablet   Oral   Take 1 tablet (10 mg total) by mouth 2 (two) times daily as needed for muscle spasms.   20 tablet   0   . ondansetron (ZOFRAN ODT) 4 MG disintegrating tablet   Oral   Take 1 tablet (4 mg total) by mouth every 8 (eight) hours as needed for nausea.   10 tablet   0   . oxyCODONE-acetaminophen (PERCOCET/ROXICET) 5-325 MG per tablet   Oral   Take 1 tablet by mouth every 4 (four) hours as needed for pain.   15 tablet   0    BP 118/74  Pulse 66  Temp(Src) 98.4 F (36.9 C) (Oral)  Resp 18  SpO2 99%  LMP 10/21/2012 Physical Exam  Vitals reviewed. Constitutional: She is oriented  to person, place, and time. She appears well-developed and well-nourished.  HENT:  Head: Normocephalic and atraumatic.  Right Ear: External ear normal.  Left Ear: External ear normal.  Eyes: Conjunctivae and EOM are normal. Pupils are equal, round, and reactive to light.  Neck: Normal range of motion. Neck supple.  Cardiovascular: Normal rate, regular rhythm, normal heart sounds and intact distal pulses.   Pulmonary/Chest: Effort normal and breath sounds normal.  Abdominal: Soft. Bowel sounds are normal. There is no tenderness.  Musculoskeletal: Normal range of motion.  Neurological: She is alert and  oriented to person, place, and time. No cranial nerve deficit or sensory deficit.  Grip strength decreased bilaterally  Skin: Skin is warm and dry.    ED Course  Procedures (including critical care time) Labs Reviewed  URINALYSIS, DIPSTICK ONLY  POCT PREGNANCY, URINE   Ct Head Wo Contrast  11/16/2012   *RADIOLOGY REPORT*  Clinical Data:  Post-traumatic headache with neck pain.  CT HEAD WITHOUT CONTRAST CT CERVICAL SPINE WITHOUT CONTRAST  Technique:  Multidetector CT imaging of the head and cervical spine was performed following the standard protocol without intravenous contrast.  Multiplanar CT image reconstructions of the cervical spine were also generated.  Comparison:  MRI brain 03/01/2010.  Head CT 04/26/2003.  CT HEAD  Findings: There is no evidence of acute intracranial hemorrhage, mass lesion, brain edema or extra-axial fluid collection.  A small focus of increased density in the right temporal lobe on image 8 is attributed to volume averaging with the petrous bone.  The ventricles and subarachnoid spaces are appropriately sized for age. There is no CT evidence of acute cortical infarction.  The visualized paranasal sinuses are clear. 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  The mastoid air cells and middle ears are clear.  The calvarium is intact.  IMPRESSION: Stable examination.  No acute intracranial or calvarial findings.  CT CERVICAL SPINE  Findings: The cervical alignment is normal.  There is no evidence of fracture or traumatic subluxation.  No acute soft tissue abnormalities are identified.  The lung apices are clear.  IMPRESSION: Negative for acute cervical spine fracture, traumatic subluxation or static signs instability.   Original Report Authenticated By: Carey Bullocks, M.D.   Ct Cervical Spine Wo Contrast  11/16/2012   *RADIOLOGY REPORT*  Clinical Data:  Post-traumatic headache with neck pain.  CT HEAD WITHOUT CONTRAST CT CERVICAL SPINE WITHOUT CONTRAST   Technique:  Multidetector CT imaging of the head and cervical spine was performed following the standard protocol without intravenous contrast.  Multiplanar CT image reconstructions of the cervical spine were also generated.  Comparison:  MRI brain 03/01/2010.  Head CT 04/26/2003.  CT HEAD  Findings: There is no evidence of acute intracranial hemorrhage, mass lesion, brain edema or extra-axial fluid collection.  A small focus of increased density in the right temporal lobe on image 8 is attributed to volume averaging with the petrous bone.  The ventricles and subarachnoid spaces are appropriately sized for age. There is no CT evidence of acute cortical infarction.  The visualized paranasal sinuses are clear. 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  The mastoid air cells and middle ears are clear.  The calvarium is intact.  IMPRESSION: Stable examination.  No acute intracranial or calvarial findings.  CT CERVICAL SPINE  Findings: The cervical alignment is normal.  There is no evidence of fracture or traumatic subluxation.  No acute soft tissue abnormalities are  identified.  The lung apices are clear.  IMPRESSION: Negative for acute cervical spine fracture, traumatic subluxation or static signs instability.   Original Report Authenticated By: Carey Bullocks, M.D.   Mr Cervical Spine Wo Contrast  11/16/2012   *RADIOLOGY REPORT*  Clinical Data: Acute onset of pain in the posterior neck after a tubing accident today.  MRI CERVICAL SPINE WITHOUT CONTRAST  Technique:  Multiplanar and multiecho pulse sequences of the cervical spine, to include the craniocervical junction and cervicothoracic junction, were obtained according to standard protocol without intravenous contrast.  Comparison: CT cervical spine 11/16/2012 at to 1724 hours.  Findings: There is some straightening of the normal cervical lordosis but vertebral body height, signal and alignment are normal.  No evidence of ligamentous injury  is identified.  The craniocervical junction is normal.  Cervical cord size, shape and signal are all normal.  The central spinal canal and neural foramina are widely patent at all levels.  Very shallow disc bulge is seen at C4-5.  IMPRESSION: Negative for evidence of trauma.  Very shallow disc bulge at C4-5 is noted.  The central spinal canal and neural foramina are widely patent at all levels.   Original Report Authenticated By: Holley Dexter, M.D.   1. Neck sprain, initial encounter   2. Concussion, without loss of consciousness, initial encounter     MDM  27 y.o. female  without pertinent PMH presents with anxiety, depression, migraine presents with neck pain and tingling/weakness to bil UE.  Mechanism was neck snapping back and forth from intertubing behind boat.  No LOC, however pt hit head against chest and has had ha and visual complaints since that time.  CT head and neck as above unremarkable, however pt still with weakness in bil UE after pain medication.  MRI ordered and as above negative.  Informed pt of results, she elected to not use cervical collar for comfort, no signs of ligamentous or cord damage to indicate need for same.  Likely etiology of symptoms sprain, doubt central cord given negative imaging and mechanism without worsening of symptoms. Given strict return precautions for changing or worsening symptoms, voices understanding, and agrees to fu. .    Labs and imaging as above reviewed by myself and attending,Dr. Anitra Lauth, with whom case was discussed.   1. Neck sprain, initial encounter   2. Concussion, without loss of consciousness, initial encounter       Noel Gerold, MD 11/17/12 0010

## 2012-11-16 NOTE — Progress Notes (Signed)
Subjective:    Patient ID: Terri Wilson, female    DOB: 05-10-1986, 27 y.o.   MRN: [id number]  HPI Terri Wilson is a 27 y.o. female  [place], hit rough water - felt head slam back and forward to chest 6-7 times. Felt muscles in neck and shoulders, upper back lock up with acute onset of pain in back of her neck.  Noted pain right away in neck.  No prior neck pain/problems. Tingling in both hands/fingers, and down both arms. Notes the tingling in hands on way here., feels like decreased grip strength.   Tx: ibuprofen 800mg  pta.  Last ate about 30 minutes ago.   LNMP: 10/23/12.     Past Medical History  Diagnosis Date  . Anxiety and depression   . Panic attack   . Herpes labialis   . Migraine   . Allergic rhinitis    Past Surgical History  Procedure Laterality Date  . Tonsillectomy and adenoidectomy  2001  . Wisdom tooth extraction     Allergies  Allergen Reactions  . Lamictal (Lamotrigine) Anaphylaxis  . Vancomycin Other (See Comments)    Feels hot   Prior to Admission medications   Medication Sig Start Date End Date Taking? Authorizing Provider  benzonatate (TESSALON) 100 MG capsule Take 1-2 capsules (100-200 mg total) by mouth 3 (three) times daily as needed for cough. 04/30/12   Chelle S Jeffery, PA-C  citalopram (CELEXA) 40 MG tablet Take 1 tablet (40 mg total) by mouth daily. 08/24/12   Phillips Odor, MD  ipratropium (ATROVENT) 0.03 % nasal spray Place 2 sprays into the nose 2 (two) times daily. 04/30/12   Chelle Tessa Lerner, PA-C  meloxicam (MOBIC) 15 MG tablet Take 1 tablet (15 mg total) by mouth daily. 04/25/12   Chelle Tessa Lerner, PA-C  oxyCODONE-acetaminophen (ROXICET) 5-325 MG per tablet Take 1 tablet by mouth every 8 (eight) hours as needed for pain. 08/28/12   Morrell Riddle, PA-C   History   Social History  . Marital Status: Married    Spouse Name: [place]    Number of Children: 0  . Years of Education: 14    Occupational History  . BUSINESS ASSISTANT [employer]    [employer]   Social History Main Topics  . Smoking status: Current Every Day Smoker -- 0.20 packs/day for 6 years    Types: Cigarettes  . Smokeless tobacco: Never Used     Comment: down from 1 PPD  . Alcohol Use: No  . Drug Use: No  . Sexually Active: Yes -- Female partner(s)    Birth Control/ Protection: Coitus interruptus     Comment: "we're very careful." Does NOT want to be pregant.   Other Topics Concern  . Not on file   Social History Narrative   Lives with husband.    Review of Systems  Constitutional: Negative for fever and chills.  Musculoskeletal: Positive for back pain (upper only. ) and arthralgias.  Skin: Negative for rash and wound.       Objective:   Physical Exam  Vitals reviewed. Constitutional: She is oriented to person, place, and time. She appears well-developed and well-nourished.  HENT:  Head: Normocephalic and atraumatic.  Right Ear: External ear normal.  Left Ear: External ear normal.  Eyes: EOM are normal. Pupils are equal, round, and reactive to light. Right eye exhibits no nystagmus. Left eye exhibits no nystagmus.  Pulmonary/Chest: Effort normal and breath sounds  normal.  Abdominal: Soft. There is no tenderness.  Musculoskeletal:       Cervical back: She exhibits decreased range of motion (guarded exam - resistant to movement. ), tenderness, bony tenderness (mid c spine - approx C3-4 ttp over spinous process - more ttp here than paraspinal mm's. ), pain and spasm. She exhibits normal pulse.       Thoracic back: She exhibits no tenderness and no bony tenderness.       Lumbar back: She exhibits no tenderness and no bony tenderness.  Neurological: She is alert and oriented to person, place, and time. No sensory deficit. She displays no Babinski's sign on the right side. She displays no Babinski's sign on the left side.  Reflex Scores:      Tricep reflexes are 2+ on the right side and 2+ on the  left side.      Bicep reflexes are 2+ on the right side and 2+ on the left side.      Brachioradialis reflexes are 2+ on the right side and 2+ on the left side.      Patellar reflexes are 2+ on the right side and 2+ on the left side.      Achilles reflexes are 1+ on the right side and 1+ on the left side. 4/5 grip strength bilaterally.  fels dysesthesias in all fingertips.   Skin: Skin is warm, dry and intact. No rash noted.      Assessment & Plan:  ALLETA Wilson is a 27 y.o. female Neck pain  Weakness of both arms - subjectively in arms, but grip strength in hands only notable area of weakness.    Neck pain, Muscle spasm, Numbness in both hands after repetitive flexion/extension injury with acute onset of cervical pain, now with dysesthesias into both hands with slight weakness of grip strength and focal mid cspine bony ttp. No focal thoracic or lumbar ttp.  EMS called for transport and placement of cervical collar for transport to ER for likely CT scanning. Advised Press photographer.

## 2012-11-17 NOTE — ED Provider Notes (Signed)
I saw and evaluated the patient, reviewed the resident's note and I agree with the findings and plan. Patient presenting due to 2 whiplash type injury to her neck while tubing today. She displays mild weakness in bilateral hands but normal bicep, tricep and shoulder strength. Normal pulses and negative CT. MRI done to rule out cord syndrome. MRI was normal patient was discharged home  Gwyneth Sprout, MD 11/17/12 [id number]

## 2012-11-18 ENCOUNTER — Telehealth: Payer: Self-pay | Admitting: Radiology

## 2012-11-18 ENCOUNTER — Encounter (INDEPENDENT_AMBULATORY_CARE_PROVIDER_SITE_OTHER): Payer: 59

## 2012-11-18 NOTE — Telephone Encounter (Signed)
Spoke to patient reprinted her AVS from the ER, she is asking if she needs to wear her cervical collar full time. I have advised her not to wear when driving and it does appear to be for her comfort. Please advise if you want to give her more specific directions regarding her collar. Terri Wilson

## 2012-11-19 NOTE — Telephone Encounter (Signed)
Please advise her to wear it for comfort full time unless driving over the next 3-5 days with slowly advancing ROM as tolerated. Continue the muscle relaxer.

## 2012-11-19 NOTE — Telephone Encounter (Signed)
Thanks. I did advise her to wear collar for comfort. She is to follow up with Alycia Rossetti this week

## 2012-11-20 ENCOUNTER — Ambulatory Visit (INDEPENDENT_AMBULATORY_CARE_PROVIDER_SITE_OTHER): Payer: 59 | Admitting: Family Medicine

## 2012-11-20 VITALS — BP 124/72 | HR 84 | Temp 98.1°F | Resp 16

## 2012-11-20 DIAGNOSIS — S060X0S Concussion without loss of consciousness, sequela: Secondary | ICD-10-CM

## 2012-11-20 DIAGNOSIS — S069X9S Unspecified intracranial injury with loss of consciousness of unspecified duration, sequela: Secondary | ICD-10-CM

## 2012-11-20 NOTE — Patient Instructions (Addendum)
Please REST- mentally and physically.   No reading, no TV, no computer, no phone, no exercise.   Nap frequently.   Please come and see me on Monday.  If you are getting worse, having worsening headache or vomiting or any other concerns please call me right away.

## 2012-11-20 NOTE — Progress Notes (Signed)
[facility] [address] [place] [address] [phone]  Date:  11/20/2012   Name:  Terri Wilson   DOB:  02-Feb-1986   MRN:  [id number]  PCP:  Carola Frost    Chief Complaint: Follow-up   History of Present Illness:  Terri Wilson is a 27 y.o. very pleasant female patient who presents with the following:  She was here on 6/29 [place].  She did not fall off the tube, but the tube hit a cross wake, the tube bounced hard and her neck/ head snapped back and forth.  She had immediate pain and came in for evaluation. She was noted to have weak grip in both hands, and cervical pain.  She was transported to the ED via EMS on a backboard.  She had a negative CT of her head and neck, and an MRI of her cervical spine- these looked ok so she was released to home.    Since her accident she has felt tired, slowed down, easily upset and emotional.  She tried to work 2 days ago but was not able to- she could not concentrate and her mother drove her back home.  Yesterday she tried to shop for some household items but found she had a hard time navigating the store.  She does admit to several concussions in years past through various accidents- she thinks she has had 4 or 5 concussions in the past.  Never had any LOC, and generally she would feel better within a short time following her previous concussions.    She tried to return to work today- started work at 12:30 pm and noted trouble right away. She feels slowed down, "I'm here but I'm not here."  She is having a hard time finding words and using her computer.  She notes that she is doing her work much more slowly than usual. Last night she forgot her husband's name for just a moment.  She feels upset and scared.   She has noted some headache but nothing severe.   LMP 10/21/12 She has not been vomiting.  No specific pains at this time.  Her neck is better.    She is generally healthy She is not taking any sedating medications at this time.   Patient Active Problem List   Diagnosis Date Noted  . Migraine 04/25/2012  . AR (allergic rhinitis) 04/25/2012  . Anxiety and depression   . Panic attack     Past Medical History  Diagnosis Date  . Anxiety and depression   . Panic attack   . Herpes labialis   . Migraine   . Allergic rhinitis     Past Surgical History  Procedure Laterality Date  . Tonsillectomy and adenoidectomy  2001  . Wisdom tooth extraction      History  Substance Use Topics  . Smoking status: Current Every Day Smoker -- 0.20 packs/day for 6 years    Types: Cigarettes  . Smokeless tobacco: Never Used     Comment: down from 1 PPD  . Alcohol Use: No    No family history on file.  Allergies  Allergen Reactions  . Lamictal (Lamotrigine) Anaphylaxis  . Vancomycin Other (See Comments)    Feels hot    Medication list has been reviewed and updated.  Current Outpatient Prescriptions on File Prior to Visit  Medication Sig Dispense Refill  . cyclobenzaprine (FLEXERIL) 10 MG  tablet Take 1 tablet (10 mg total) by mouth 2 (two) times daily as needed for muscle spasms.  20 tablet  0  . folic acid ([employer]S FOLIC ACID) 800 MCG tablet Take 800 mcg by mouth at bedtime.      . Prenatal Vit-Fe Fumarate-FA (MULTIVITAMIN-PRENATAL) 27-0.8 MG TABS Take 1 tablet by mouth at bedtime.      . ondansetron (ZOFRAN ODT) 4 MG disintegrating tablet Take 1 tablet (4 mg total) by mouth every 8 (eight) hours as needed for nausea.  10 tablet  0  . oxyCODONE-acetaminophen (PERCOCET/ROXICET) 5-325 MG per tablet Take 1 tablet by mouth every 4 (four) hours as needed for pain.  15 tablet  0   No current facility-administered medications on file prior to visit.    Review of Systems:  As per HPI- otherwise negative.   Physical Examination: Filed Vitals:   11/20/12 1852  BP: 124/72  Pulse: 121  Temp: 98.1 F (36.7 C)  Resp: 16   There were  no vitals filed for this visit. There is no weight on file to calculate BMI. Ideal Body Weight:    GEN: WDWN, NAD, Non-toxic, A & O x 3, accompanied by her husband and mother  HEENT: Atraumatic, Normocephalic. Neck supple. No masses, No LAD.  Bilateral TM wnl, oropharynx normal.  PEERL,EOMI.   Cervical spine is now non- tender, with normal ROM in all directions Ears and Nose: No external deformity. CV: RRR, No M/G/R. No JVD. No thrill. No extra heart sounds. PULM: CTA B, no wheezes, crackles, rhonchi. No retractions. No resp. distress. No accessory muscle use. EXTR: No c/c/e NEURO Normal gait. Normal strength, sensation and dtr all extremities.  Normal romberg and "tight-rope walk" tests.   PSYCH: Normally interactive.  Upset and tearful, but consolable.     Assessment and Plan: Concussion without loss of consciousness, sequela  Terri Wilson is here to follow- up a head injury/ concussion which occurred on 6/29.  She had a thorough ED evaluation including imaging of her brain and spine, all of which were normal.  She is doing fairly well when resting at home, but has trouble with tasks such as driving and shopping.  She has tried to return to work twice but has not been able to due to increased sx.  Discussed in detail with Angelina and her family.  I am glad to repeat her CT head or set up an MRI to ensure there is no other pathology.  However, she does not have a severe HA, and her neuro exam is normal which is reassuring.  At this time they decline further imaging.  Explained that she will need total physical and mental rest until her sx resolve.  She should avoid screen time and not drive.  She will come and see me on Monday for a recheck and knows she can call at any time with concerns.  If any severe HA/ vomiting, etc they will seek care at [facility] or ED.    Signed Abbe Amsterdam, MD

## 2012-12-19 NOTE — Progress Notes (Signed)
This encounter was created in error - please disregard.

## 2012-12-26 ENCOUNTER — Ambulatory Visit (INDEPENDENT_AMBULATORY_CARE_PROVIDER_SITE_OTHER): Payer: 59 | Admitting: Family Medicine

## 2012-12-26 ENCOUNTER — Encounter: Payer: Self-pay | Admitting: Family Medicine

## 2012-12-26 VITALS — BP 116/68 | HR 84 | Temp 98.3°F | Resp 16 | Ht 67.0 in | Wt 137.2 lb

## 2012-12-26 DIAGNOSIS — F341 Dysthymic disorder: Secondary | ICD-10-CM

## 2012-12-26 DIAGNOSIS — F418 Other specified anxiety disorders: Secondary | ICD-10-CM

## 2012-12-30 ENCOUNTER — Encounter: Payer: Self-pay | Admitting: Family Medicine

## 2012-12-30 NOTE — Progress Notes (Signed)
S:  This 27 y.o. Cauc female has chronic depression w/ anxiety since mid-teen years. She takes an SSRI and recently tried to wean herself off Citalopram 40 mg; she began to feels increased anxiety w/ "electricity feeling" and "feeling strange" within 36 hours. She is concerned about use of this medication as she and her husband are trying to conceive. They have been trying for 12 months +/- 3 months. Pt has seen Dr. Juliene Pina (OB-GYN) on one occasion and reports that the specialist is concerned about her lack of conception off OCPs. Pt feels well otherwise.  Patient Active Problem List   Diagnosis Date Noted  . Migraine 04/25/2012  . AR (allergic rhinitis) 04/25/2012  . Anxiety and depression   . Panic attack     PMHx,Soc Hx and Fam Hx reviewed.  ROS: Noncontributory.  O: Filed Vitals:   12/26/12 1519  BP: 116/68  Pulse: 84  Temp: 98.3 F (36.8 C)  Resp: 16   GEN: In NAD; WN,WD. HENT: [place]/AT. EOMI w/ clear conj/sclerae. Otherwise umremarkable. COR: RRR. LUNGS: Normal resp rate and effort. SKIN: W&D; no erythema, rashes or pallor. MS: MAEs; no c/c/e. NEURO: A&O x 3; Cns intact. Nonfocal.  A/P: Depression with anxiety- advised pt to continue her current medication and return to OB-GYN for pre-conception counseling. The specialist can advise her re: safety of this medication during pregnancy or recommend weaning to a lower dose or changing to a different SSRI. Pt agrees to schedule follow-up appt w/ Dr. Juliene Pina.

## 2013-01-29 ENCOUNTER — Ambulatory Visit (INDEPENDENT_AMBULATORY_CARE_PROVIDER_SITE_OTHER): Payer: 59 | Admitting: Physician Assistant

## 2013-01-29 VITALS — BP 110/62 | HR 92 | Temp 97.8°F | Resp 16

## 2013-01-29 DIAGNOSIS — F329 Major depressive disorder, single episode, unspecified: Secondary | ICD-10-CM

## 2013-01-29 DIAGNOSIS — F32A Depression, unspecified: Secondary | ICD-10-CM

## 2013-01-29 MED ORDER — CITALOPRAM HYDROBROMIDE 10 MG PO TABS: 30.0000 mg | ORAL_TABLET | Freq: Every day | ORAL | Status: DC

## 2013-01-29 NOTE — Progress Notes (Signed)
   [address] [place] [address]   Phone [phone]   Subjective:    Patient ID: Terri Wilson, female    DOB: [birth year], 27 y.o.   MRN: [id number]  HPI Pt is here for a consultation regarding antidepressants and the interest in becoming pregnant.  She has her husband have decided to start a family and she has been given the ok by Dr Juliene Pina to stay on her Celexa but she is just not comfortable with being on that medication and she would like to discuss other options.  She has significant h/o severe depression with hospitalizations.  She has a good support group with her friends and family.  She tried to stop the Celexa but she started to have withdraw symptoms so she started back on her current dose.  Review of Systems     Objective:   Physical Exam  Vitals reviewed. Constitutional: She is oriented to person, place, and time. She appears well-developed and well-nourished.  HENT:  Head: Normocephalic and atraumatic.  Right Ear: External ear normal.  Left Ear: External ear normal.  Pulmonary/Chest: Effort normal.  Neurological: She is alert and oriented to person, place, and time.  Skin: Skin is warm and dry.  Psychiatric: She has a normal mood and affect. Her behavior is normal. Judgment and thought content normal.      Assessment & Plan:  Depression - Plan: citalopram (CELEXA) 10 MG tablet  Pt knows that her depression is significant and she understands that there are possible risks associated with all medications and that being off the medications also has risks for her.  We will try to taper off the Celexa over the several weeks - if she starts to develop withdraw symptoms she will continue at the dose that did not cause her symptoms.  She will also continue to monitor her signs of depression as she decreases her dose of medications.  I think that the patient has a good support group but due to her history and the changes that will be occuring to her body during the pregnancy  that we need to keep a close eye on her depression and she agrees and understands.  She will continue her PNV as she has started.  Benny Lennert PA-C 01/29/2013 8:39 PM

## 2013-01-29 NOTE — Patient Instructions (Signed)
Celexa 10mg  For at least 5 days take 30mg  (that is 3 pills) Then if no symptoms of withdraw ok to decrease to 20 mg for 5 days - if after 5 days no withdraw symptoms decrease to 10mg  and then after 5 days decrease to NONE. If you get get return depression symptoms stop titrate and talk to Maralyn Sago

## 2013-02-11 ENCOUNTER — Telehealth: Payer: Self-pay

## 2013-02-13 NOTE — Telephone Encounter (Signed)
Error

## 2013-03-11 ENCOUNTER — Ambulatory Visit (INDEPENDENT_AMBULATORY_CARE_PROVIDER_SITE_OTHER): Payer: 59 | Admitting: Physician Assistant

## 2013-03-11 VITALS — BP 128/68 | HR 92 | Temp 98.3°F | Resp 18

## 2013-03-11 DIAGNOSIS — R42 Dizziness and giddiness: Secondary | ICD-10-CM

## 2013-03-11 DIAGNOSIS — R002 Palpitations: Secondary | ICD-10-CM

## 2013-03-11 NOTE — Progress Notes (Signed)
Patient ID: FILOMENA POKORNEY MRN: [id number], DOB: 04/21/1986, 26 y.o. Date of Encounter: 03/11/2013, 4:39 PM  Primary Physician: Carola Frost  Chief Complaint: Palpitations and dizziness  HPI: 27 y.o. female with history below presents with onset of palpitations and dizziness the previous evening. Initially she states she would feel a "clunk" then her heart would stop for a second about every minutes when this started yesterday while she was driving to work. She did not think much of this and continued on with her day. However, today they have been becoming more frequent sometimes every 30 seconds and are causing her to become dizzy. No associated chest pain, chest tightness, SOB, nausea, vomiting, or diaphoresis. These episodes are not exertional. No new medications. She has recently been tapering down her Celexa at the advice of her provider, but had to go back up to 20 mg daily. Working towards a family. She does not feel like stress is very high in her life at all right now. Things are going well.      Past Medical History  Diagnosis Date  . Anxiety and depression   . Panic attack   . Herpes labialis   . Migraine   . Allergic rhinitis      Home Meds: Prior to Admission medications   Medication Sig Start Date End Date Taking? Authorizing Provider  citalopram (CELEXA) 10 MG tablet Take 20 mg by mouth daily. 01/29/13  Yes Morrell Riddle, PA-C  folic acid ([employer]S FOLIC ACID) 800 MCG tablet Take 800 mcg by mouth at bedtime.   Yes Historical Provider, MD  Prenatal Vit-Fe Fumarate-FA (MULTIVITAMIN-PRENATAL) 27-0.8 MG TABS Take 1 tablet by mouth at bedtime.   Yes Historical Provider, MD    Allergies:  Allergies  Allergen Reactions  . Lamictal [Lamotrigine] Anaphylaxis  . Vancomycin Other (See Comments)    Feels hot    History   Social History  . Marital Status: Married    Spouse Name: [place]    Number of Children: 0  . Years of Education: 14   Occupational History  .  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 Smoking status: Current Every Day Smoker -- 0.20 packs/day for 6 years    Types: Cigarettes  . Smokeless tobacco: Never Used     Comment: down from 1 PPD  . Alcohol Use: No  . Drug Use: No  . Sexual Activity: Yes    Partners: Male    Birth Control/ Protection: Coitus interruptus     Comment: "we're very careful." Does NOT want to be pregnant.   Other Topics Concern  . Not on file   Social History Narrative   Lives with husband.     Review of Systems: Constitutional: negative for chills, fever, or fatigue  HEENT: negative for vision changes, hearing loss, congestion, rhinorrhea, ST, epistaxis, or sinus pressure Cardiovascular: positive for palpitations. negative for chest pain  Respiratory: negative for wheezing, shortness of breath, or cough Abdominal: negative for abdominal pain, nausea, vomiting, or diarrhea Dermatological: negative for rash Neurologic: positive for dizziness. negative for headache or syncope   Physical Exam: Blood pressure 128/68, pulse 92, temperature 98.3 F (36.8 C), temperature source Oral, resp. rate 18, last menstrual period 02/18/2013, SpO2 99.00%., There is no weight on file to calculate BMI. General: Well developed, well nourished, in no acute distress. Head: Normocephalic, atraumatic, eyes without discharge, sclera non-icteric, nares are without discharge.   Neck: Supple. Full ROM.  Lungs: Clear bilaterally to auscultation without wheezes, rales, or rhonchi. Breathing is unlabored. Heart: RRR with S1 S2. No murmurs, rubs, or gallops appreciated. Msk:  Strength and tone normal for age. Extremities/Skin: Warm and dry. No clubbing or cyanosis. No edema. No rashes or suspicious lesions. Neuro: Alert and oriented X 3. Moves all extremities spontaneously. Gait is normal. CNII-XII grossly in tact. Psych:  Responds to questions appropriately with a normal affect.   Check  TSH  ASSESSMENT AND PLAN:  27 y.o. female with palpitations and dizziness -Reassuring EKG -Will set patient up for a Holter monitor -Further evaluation and treatment pending -Await labs  Signed, Eula Listen, PA-C [facility] [place], [place] [address] [phone] 03/11/2013 4:39 PM

## 2013-03-12 LAB — TSH: TSH: 1.205 u[IU]/mL (ref 0.350–4.500)

## 2013-03-13 ENCOUNTER — Ambulatory Visit: Payer: 59 | Admitting: *Deleted

## 2013-03-13 DIAGNOSIS — R42 Dizziness and giddiness: Secondary | ICD-10-CM

## 2013-03-13 DIAGNOSIS — R002 Palpitations: Secondary | ICD-10-CM

## 2013-03-13 NOTE — Patient Instructions (Signed)
Your physician has recommended that you wear a holter monitor. Holter monitors are medical devices that record the heart's electrical activity. Doctors most often use these monitors to diagnose arrhythmias. Arrhythmias are problems with the speed or rhythm of the heartbeat. The monitor is a small, portable device. You can wear one while you do your normal daily activities. This is usually used to diagnose what is causing palpitations/syncope (passing out).  Return the monitor on Monday 03/16/13

## 2013-03-23 ENCOUNTER — Encounter: Payer: Self-pay | Admitting: Physician Assistant

## 2013-03-23 ENCOUNTER — Ambulatory Visit (INDEPENDENT_AMBULATORY_CARE_PROVIDER_SITE_OTHER): Payer: 59 | Admitting: Physician Assistant

## 2013-03-23 ENCOUNTER — Encounter: Payer: Self-pay | Admitting: Cardiovascular Disease

## 2013-03-23 VITALS — BP 113/71 | HR 83 | Temp 97.0°F | Resp 16 | Ht 67.0 in | Wt 142.0 lb

## 2013-03-23 DIAGNOSIS — R110 Nausea: Secondary | ICD-10-CM

## 2013-03-23 DIAGNOSIS — F329 Major depressive disorder, single episode, unspecified: Secondary | ICD-10-CM

## 2013-03-23 DIAGNOSIS — R42 Dizziness and giddiness: Secondary | ICD-10-CM

## 2013-03-23 DIAGNOSIS — F32A Depression, unspecified: Secondary | ICD-10-CM

## 2013-03-23 MED ORDER — CITALOPRAM HYDROBROMIDE 10 MG PO TABS: 20.0000 mg | ORAL_TABLET | Freq: Every day | ORAL | Status: DC

## 2013-03-23 MED ORDER — ONDANSETRON 4 MG PO TBDP: 4.0000 mg | ORAL_TABLET | Freq: Three times a day (TID) | ORAL | Status: DC | PRN

## 2013-03-23 NOTE — Progress Notes (Signed)
Patient ID: Terri Wilson MRN: [id number], DOB: 1985/06/20, 27 y.o. Date of Encounter: 03/23/2013, 8:05 PM  Primary Physician: Carola Frost  Chief Complaint: Follow up  HPI: 27 y.o. female with history below presents for follow up of dizziness. Wore a Holter monitor x 48 hours on 03/13/13. Awaiting these results. Patient reports having a positive pregnancy test at home that since been confirmed through her OB's office, Dr. Mitzi Hansen. She again had a repeat HCG drawn today and is awaiting these results. Her estimated due date is 11/25/13. She is taking her PNV and has stopped smoking. No nausea. Her first OB visit is 04/24/13.   Her palpitations have decreased in frequency but are more noticeable when she does have them. They seem to pick up in frequency in the evening or if she has a heavy meal. They still make her dizzy. She would like to hold off on any medical treatment at this time if at all possible. She is tolerating them ok at this time. No syncope.   Depression is well controlled. Currently on Celexa 20 mg daily. Previously tried to taper down to 10 mg daily and did not do well on this. She would like to stay on 20 mg daily at this time. She understands this is pregnancy category C.    Past Medical History  Diagnosis Date  . Anxiety and depression   . Panic attack   . Herpes labialis   . Migraine   . Allergic rhinitis   . Chiari malformation     7 mm     Home Meds: Prior to Admission medications   Medication Sig Start Date End Date Taking? Authorizing Provider  citalopram (CELEXA) 10 MG tablet Take 20 mg by mouth daily. 01/29/13  Yes Morrell Riddle, PA-C  folic acid ([employer]S FOLIC ACID) 800 MCG tablet Take 800 mcg by mouth at bedtime.   Yes Historical Provider, MD  Prenatal Vit-Fe Fumarate-FA (MULTIVITAMIN-PRENATAL) 27-0.8 MG TABS Take 1 tablet by mouth at bedtime.   Yes Historical Provider, MD    Allergies:  Allergies  Allergen Reactions  . Lamictal [Lamotrigine] Anaphylaxis    . Vancomycin Other (See Comments)    Feels hot    History   Social History  . Marital Status: Married    Spouse Name: [place]    Number of Children: 0  . Years of Education: 14   Occupational History  . 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 Smoking status: Former Smoker -- 0.20 packs/day for 6 years    Types: Cigarettes  . Smokeless tobacco: Never Used     Comment: down from 1 PPD  . Alcohol Use: No  . Drug Use: No  . Sexual Activity: Yes    Partners: Male   Other Topics Concern  . Not on file   Social History Narrative   Lives with husband.     Review of Systems: Constitutional: negative for chills, fever, or fatigue  HEENT: positive for congestion and rhinorrhea. negative for vision changes, hearing loss, ST, or sinus pressure Cardiovascular: positive for palpitations. negative for chest pain  Respiratory: negative for wheezing, shortness of breath, or cough Abdominal: negative for abdominal pain, nausea, vomiting, diarrhea, or constipation Dermatological: negative for rash Neurologic: positive for dizziness. negative for headache or syncope   Physical Exam: Blood pressure 113/71, pulse 83, temperature 97 F (36.1 C), temperature source Oral, resp. rate 16, height 5\' 7"  (1.702 m), weight 142 lb (  64.411 kg), last menstrual period 02/18/2013., Body mass index is 22.24 kg/(m^2). General: Well developed, well nourished, in no acute distress. Head: Normocephalic, atraumatic, eyes without discharge, sclera non-icteric, nares are without discharge.   No lymphadenopathy. Lungs: Breathing is unlabored. Heart: Regular rate. Msk:  Strength and tone normal for age. Extremities/Skin: Warm and dry. No clubbing or cyanosis. No edema. No rashes or suspicious lesions. Neuro: Alert and oriented X 3. Moves all extremities spontaneously. Gait is normal. CNII-XII grossly in tact. Psych:  Responds to questions appropriately with a normal affect.      ASSESSMENT AND PLAN:  27 y.o. female with newly diagnosed pregnancy, dizziness, palpitations, and depression.  1) Pregnancy -Newly diagnosed -Not smoking tobacco  -Levels being followed by her OB's office, Dr. Mitzi Hansen  -Currently taking PNV with folate -Has OB appointment on 04/24/13 -She asks about exposure to her parrot and Chlamydia psittaci exposure/vaccine -I am not aware of a C psittaci vaccine being available thus I have advised her to avoid exposure to the bird at this time    2) Dizziness and palpitations -Await Holter monitor results -Advised patient this is most likely secondary to her pregnancy   -Consider Acebutolol, pregnancy category B -Advised patient these are her hormones and if she does not want to take medication we may try to find a lifestyle change that helps, however this may not be effective   -Consider echo -History of mild Chiari malformation, 7 mm. No syrinx. Last imaged in 2011.   3) Depression -Well controlled  -She would like to remain on Celexa 20 mg and she is aware this is pregnancy category C -Celexa 10 mg 2 po daily #60 RF 5 -Discuss Celexa further with her OB   Signed, Eula Listen, PA-C [facility] [place], [place] [address] [phone] 03/23/2013 8:05 PM

## 2013-03-24 ENCOUNTER — Encounter: Payer: Self-pay | Admitting: Physician Assistant

## 2013-03-26 ENCOUNTER — Other Ambulatory Visit: Payer: Self-pay

## 2013-04-24 LAB — OB RESULTS CONSOLE HIV ANTIBODY (ROUTINE TESTING): HIV: NONREACTIVE

## 2013-04-24 LAB — OB RESULTS CONSOLE HEPATITIS B SURFACE ANTIGEN: Hepatitis B Surface Ag: NEGATIVE

## 2013-04-24 LAB — OB RESULTS CONSOLE ANTIBODY SCREEN: Antibody Screen: NEGATIVE

## 2013-04-24 LAB — OB RESULTS CONSOLE ABO/RH: RH Type: POSITIVE

## 2013-04-24 LAB — OB RESULTS CONSOLE RUBELLA ANTIBODY, IGM: Rubella: IMMUNE

## 2013-04-24 LAB — OB RESULTS CONSOLE RPR: RPR: NONREACTIVE

## 2013-05-01 LAB — OB RESULTS CONSOLE GC/CHLAMYDIA
Chlamydia: NEGATIVE
Gonorrhea: NEGATIVE

## 2013-05-21 NOTE — L&D Delivery Note (Signed)
Delivery Note At 10:38 AM a viable and healthy female was delivered via Vaginal, Spontaneous Delivery (Presentation: Right Occiput Anterior).  APGAR:8 ,9 ; weight pending   Placenta status: Intact, Spontaneous. Not sent  Cord: 3 vessels with the following complications: None.  Cord pH: none  Anesthesia: Epidural Local  Episiotomy: None Lacerations: 1st degree;Perineal, subclitoral tear Suture Repair: 3.0 chromic Est. Blood Loss (mL): 350  Mom to postpartum.  Baby to Couplet care / Skin to Skin.  COUSINS,SHERONETTE A 11/14/2013, 11:23 AM

## 2013-06-16 ENCOUNTER — Encounter: Payer: Self-pay | Admitting: Diagnostic Neuroimaging

## 2013-06-16 ENCOUNTER — Ambulatory Visit (INDEPENDENT_AMBULATORY_CARE_PROVIDER_SITE_OTHER): Payer: 59 | Admitting: Diagnostic Neuroimaging

## 2013-06-16 VITALS — BP 85/61 | HR 81 | Temp 97.4°F | Ht 67.75 in | Wt 150.0 lb

## 2013-06-16 DIAGNOSIS — O0001 Abdominal pregnancy with intrauterine pregnancy: Secondary | ICD-10-CM

## 2013-06-16 DIAGNOSIS — Q048 Other specified congenital malformations of brain: Secondary | ICD-10-CM | POA: Insufficient documentation

## 2013-06-16 NOTE — Progress Notes (Signed)
[facility]  PATIENT: Terri Wilson DOB: 08-15-1985  REFERRING CLINICIAN: V Mody HISTORY FROM: patient  REASON FOR VISIT: new consult   HISTORICAL  CHIEF COMPLAINT:  Chief Complaint  Patient presents with  . Neurologic Problem    chiari malformation    HISTORY OF PRESENT ILLNESS:   28 year old right-handed female (currently [redacted] weeks EGA) here for evaluation of Chiari malformation and eligibility for epidural anesthesia.  In 2011 patient was evaluated for headache, had MRI brain which was reported as "Mild Chiari malformation, 7 mm. Otherwise normal."   Since then, had accident inner tubing with neck pain, resulting in MRI cervical spine which was reported as "Negative for evidence of trauma. Very shallow disc bulge at C4-5 is noted. The central spinal canal and neural foramina are widely  patent at all levels." I reviewed MRI and there is mild cerebellar tonsillar ectopia without frank chiari malformation.  Otherwise, no numbness, weakness or problems with her arms or legs. No headaches.    REVIEW OF SYSTEMS: Full 14 system review of systems performed and notable only for depression.  ALLERGIES: Allergies  Allergen Reactions  . Lamictal [Lamotrigine] Anaphylaxis  . Vancomycin Other (See Comments)    Feels hot    HOME MEDICATIONS: Outpatient Prescriptions Prior to Visit  Medication Sig Dispense Refill  . citalopram (CELEXA) 10 MG tablet Take 2 tablets (20 mg total) by mouth daily.  60 tablet  5  . Prenatal Vit-Fe Fumarate-FA (MULTIVITAMIN-PRENATAL) 27-0.8 MG TABS Take 1 tablet by mouth at bedtime.      . folic acid ([employer] FOLIC ACID) [id number] MCG tablet Take 800 mcg by mouth at bedtime.      . ondansetron (ZOFRAN-ODT) 4 MG disintegrating tablet Take 1 tablet (4 mg total) by mouth every 8 (eight) hours as needed for nausea.  20 tablet  5   No facility-administered medications prior to visit.    PAST MEDICAL HISTORY: Past Medical History    Diagnosis Date  . Anxiety and depression   . Panic attack   . Herpes labialis   . Migraine   . Allergic rhinitis   . Chiari malformation     7 mm    PAST SURGICAL HISTORY: Past Surgical History  Procedure Laterality Date  . Tonsillectomy and adenoidectomy  2001  . Wisdom tooth extraction      FAMILY HISTORY: No family history on file.  SOCIAL HISTORY:  History   Social History  . Marital Status: Married    Spouse Name: [name]    Number of Children: 0  . Years of Education: 14   Occupational History  . 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 Smoking status: Former Smoker -- 0.20 packs/day for 6 years    Types: Cigarettes  . Smokeless tobacco: Never Used     Comment: down from 1 PPD  . Alcohol Use: No  . Drug Use: No  . Sexual Activity: Yes    Partners: Male   Other Topics Concern  . Not on file   Social History Narrative   Lives with husband.   Caffeine Use: 40mg /day     PHYSICAL EXAM  Filed Vitals:   06/16/13 1139  BP: 85/61  Pulse: 81  Temp: 97.4 F (36.3 C)  TempSrc: Oral  Height: 5' 7.75" (1.721 m)  Weight: 150 lb (68.04 kg)    Not recorded    Body mass index is 22.97 kg/(m^2).  GENERAL EXAM: Patient is  in no distress; well developed, nourished and groomed; neck is supple; GRAVID ABDOMEN.  CARDIOVASCULAR: Regular rate and rhythm, no murmurs, no carotid bruits  NEUROLOGIC: MENTAL STATUS: awake, alert, oriented to person, place and time, recent and remote memory intact, normal attention and concentration, language fluent, comprehension intact, naming intact, fund of knowledge appropriate CRANIAL NERVE: no papilledema on fundoscopic exam, pupils equal and reactive to light, visual fields full to confrontation, extraocular muscles intact, no nystagmus, facial sensation and strength symmetric, hearing intact, palate elevates symmetrically, uvula midline, shoulder shrug symmetric, tongue midline. MOTOR:  normal bulk and tone, full strength in the BUE, BLE SENSORY: normal and symmetric to light touch, pinprick, temperature, vibration COORDINATION: finger-nose-finger, fine finger movements normal REFLEXES: deep tendon reflexes present and symmetric GAIT/STATION: narrow based gait; able to walk tandem; romberg is negative    DIAGNOSTIC DATA (LABS, IMAGING, TESTING) - I reviewed patient records, labs, notes, testing and imaging myself where available.  Lab Results  Component Value Date   WBC 7.1 08/28/2012   HGB 11.8* 08/28/2012   HCT 37.7 08/28/2012   MCV 91.4 08/28/2012   PLT 179 03/06/2011      Component Value Date/Time   NA 138 05/29/2012 2136   K 3.7 05/29/2012 2136   CL 104 05/29/2012 2136   CO2 29 05/29/2012 2136   GLUCOSE 104* 05/29/2012 2136   BUN 9 05/29/2012 2136   CREATININE 0.73 05/29/2012 2136   CREATININE 0.61 03/06/2011 0620   CALCIUM 9.4 05/29/2012 2136   PROT 6.8 05/29/2012 2136   ALBUMIN 4.4 05/29/2012 2136   AST 15 05/29/2012 2136   ALT 10 05/29/2012 2136   ALKPHOS 62 05/29/2012 2136   BILITOT 0.3 05/29/2012 2136   GFRNONAA >90 03/06/2011 0620   GFRAA >90 03/06/2011 0620   No results found for this basename: CHOL, HDL, LDLCALC, LDLDIRECT, TRIG, CHOLHDL   No results found for this basename: HGBA1C   No results found for this basename: VITAMINB12   Lab Results  Component Value Date   TSH 1.205 03/11/2013    I reviewed images myself and agree with interpretation.  MRI BRAIN - 6mm of of cerebellar tonsillar ectopia; otherwise unremarkable  MRI CERVICAL SPINE - 3-3mm of cerebellar tonsillar ectopia; otherwise unremarkable   ASSESSMENT AND PLAN  28 y.o. year old female here with history of chiari malformation. I reviewed prior MRI brain and c-spine studies and think findings are more consistent with mild cerebellar tonsillar ectopia (a benign normal variant) rather than true chiari malformation. With regards to epidural spinal anesthesia, I do not think there are any neurologic  or neuroimaging contraindications going forward, although I defer comprehensive review of known risk/benefits of the procedure to be explained by the anesthesiologist.  Of note, I reviewed an abstract of an article which is a small retrospective review of 12 patients with known chiari malformation, none of whom developed chiari exacerbation following general or epidural anesthesia during childbirth.  Review of literature: "Chiari I malformation in parturients Robert C. Chantigian, MD, a, ?, , [place]. Koehn, MDa, ?, Janne Napoleon. Ramin, MDa, ?, Mark A. Suzan Slick, MDa,  Journal of Clinical Anesthesia Volume 14, Issue 3, May 2002, Pages 201-205  Study Objective: To assess complications of regional as well as general anesthesia in parturients with Chiari I malformation. Patients: All parturients in our institution who had the diagnosis of Chiari I malformation and delivered in our hospitals over a 50-year period.  Main Results: 12 parturients delivered 30 babies. Three deliveries were facilitated with  general anesthesia. Nine deliveries were facilitated with central axis anesthesia, six with epidural anesthesia, two with a single injection of a spinal anesthetic, and one with a continuous spinal catheter. The patient who received a continuous spinal catheter developed a postdural puncture headache that resolved with an epidural blood patch. None of the patients who received general, spinal, or epidural anesthesia for their deliveries developed symptoms or had exacerbation of preexisting symptoms of Chiari I malformation.  Conclusions: General anesthesia, as well as spinal and epidural anesthesia, appeared to be safe and effective in our series of vaginal or cesarean delivery patients. The small number of patients in our series does not negate the cautious recommendations of others, but suggests that general anesthesia, as well as spinal or epidural anesthesia, can be used safely and effectively in these  patients."  PLAN: - f/u with ob/gyn (Dr. Benjie Karvonen)   Return if symptoms worsen or fail to improve, for return to Dr. Benjie Karvonen.    Penni Bombard, MD 2/84/1324, 40:10 PM Certified in Neurology, Neurophysiology and Neuroimaging  [facility] [address] [place], [place] [address] [phone]

## 2013-07-08 ENCOUNTER — Other Ambulatory Visit: Payer: Self-pay | Admitting: Physician Assistant

## 2013-07-08 DIAGNOSIS — F329 Major depressive disorder, single episode, unspecified: Secondary | ICD-10-CM

## 2013-07-08 DIAGNOSIS — F419 Anxiety disorder, unspecified: Secondary | ICD-10-CM

## 2013-07-08 DIAGNOSIS — F32A Depression, unspecified: Secondary | ICD-10-CM

## 2013-07-08 MED ORDER — BUSPIRONE HCL 5 MG PO TABS: 5.0000 mg | ORAL_TABLET | Freq: Two times a day (BID) | ORAL | Status: DC

## 2013-07-08 MED ORDER — CITALOPRAM HYDROBROMIDE 20 MG PO TABS: 20.0000 mg | ORAL_TABLET | Freq: Every day | ORAL | Status: DC

## 2013-07-08 NOTE — Progress Notes (Signed)
Pt is having a lot of anxiety and worry - mostly related to increased stress at work and being understaffed and feeling like she is doing the job of 3 people at a time.  She feels like she is going to exploded and worried that she is going to say something that is going to get her in trouble.  She is not happy most of the time.  This has been going on for several months but recently has gotten much worse.  She is even irritable around her family who she knows is really supportive of her.  She is only comfortable with her husband and feels good around him.  She is sleeping ok at night.  The pregnancy is going well.  She has been using her Celexa 10mg  daily.  She has no thoughts of suicide or hurting herself or her baby.  She is worried that her baby will be taken away when it is born because she "loses it".  She has not talked to her OB about these feelings - she pretends everything is ok.  She has been to counseling in the past but has never found it helpful.  She is currently not having panic attacks.  Called and spoke with Olivia Mackie at [facility] who spoke with Dr Benjie Karvonen.  We will increase her Celexa to 20mg  and add Buspar 5mg  bid - she will f/u with me in about 3 weeks - sooner if she needs to see me.

## 2013-08-26 ENCOUNTER — Ambulatory Visit (INDEPENDENT_AMBULATORY_CARE_PROVIDER_SITE_OTHER): Payer: 59 | Admitting: Family Medicine

## 2013-08-26 VITALS — BP 90/56 | HR 93 | Temp 98.0°F | Resp 16 | Wt 160.0 lb

## 2013-08-26 DIAGNOSIS — R110 Nausea: Secondary | ICD-10-CM

## 2013-08-26 DIAGNOSIS — R42 Dizziness and giddiness: Secondary | ICD-10-CM

## 2013-08-26 DIAGNOSIS — I959 Hypotension, unspecified: Secondary | ICD-10-CM

## 2013-08-26 LAB — POCT CBC
Granulocyte percent: 66.9 %G (ref 37–80)
HCT, POC: 35.4 % — AB (ref 37.7–47.9)
Hemoglobin: 11.3 g/dL — AB (ref 12.2–16.2)
Lymph, poc: 2.6 (ref 0.6–3.4)
MCH, POC: 29 pg (ref 27–31.2)
MCHC: 31.9 g/dL (ref 31.8–35.4)
MCV: 91.1 fL (ref 80–97)
MID (cbc): 0.5 (ref 0–0.9)
MPV: 7.9 fL (ref 0–99.8)
POC Granulocyte: 6.2 (ref 2–6.9)
POC LYMPH PERCENT: 27.7 %L (ref 10–50)
POC MID %: 5.4 %M (ref 0–12)
Platelet Count, POC: 212 10*3/uL (ref 142–424)
RBC: 3.89 M/uL — AB (ref 4.04–5.48)
RDW, POC: 13.4 %
WBC: 9.3 10*3/uL (ref 4.6–10.2)

## 2013-08-26 LAB — COMPREHENSIVE METABOLIC PANEL
ALT: 9 U/L (ref 0–35)
AST: 13 U/L (ref 0–37)
Albumin: 3.2 g/dL — ABNORMAL LOW (ref 3.5–5.2)
Alkaline Phosphatase: 73 U/L (ref 39–117)
BUN: 5 mg/dL — ABNORMAL LOW (ref 6–23)
CO2: 21 mEq/L (ref 19–32)
Calcium: 7.9 mg/dL — ABNORMAL LOW (ref 8.4–10.5)
Chloride: 105 mEq/L (ref 96–112)
Creat: 0.52 mg/dL (ref 0.50–1.10)
Glucose, Bld: 104 mg/dL — ABNORMAL HIGH (ref 70–99)
Potassium: 3.9 mEq/L (ref 3.5–5.3)
Sodium: 134 mEq/L — ABNORMAL LOW (ref 135–145)
Total Bilirubin: 0.3 mg/dL (ref 0.2–1.2)
Total Protein: 5.9 g/dL — ABNORMAL LOW (ref 6.0–8.3)

## 2013-08-26 LAB — POCT URINALYSIS DIPSTICK
Bilirubin, UA: NEGATIVE
Blood, UA: NEGATIVE
Glucose, UA: NEGATIVE
Ketones, UA: NEGATIVE
Leukocytes, UA: NEGATIVE
Nitrite, UA: NEGATIVE
Protein, UA: NEGATIVE
Spec Grav, UA: <=1.005
Urobilinogen, UA: 0.2
pH, UA: 7

## 2013-08-26 LAB — POCT UA - MICROSCOPIC ONLY
Casts, Ur, LPF, POC: NEGATIVE
Crystals, Ur, HPF, POC: NEGATIVE
Mucus, UA: POSITIVE
Yeast, UA: NEGATIVE

## 2013-08-26 LAB — GLUCOSE, POCT (MANUAL RESULT ENTRY): POC Glucose: 88 mg/dl (ref 70–99)

## 2013-08-26 NOTE — Patient Instructions (Signed)

## 2013-08-26 NOTE — Progress Notes (Signed)
Chief Complaint:  Chief Complaint  Patient presents with  . Dizziness    x 2 days    HPI: Terri Wilson is a 28 y.o. female who is here for dizzness, started last night. She has been on a new BP medication , to stop preterm contractions. She has associated nausea with dizziness, dizziness is worse with movement, when she changes position, gets up and down. She has dizziness when she  look at her computer. She gets this more when she changes positons, in and out of the bathroom. She has numbness in face and arms , recent nifedipine 10 mg nightly for preterm contractions was rx by ob/gyn, last dose 48 hrs ago. No vomiting. No HAs, no double vision. She does have a h/o 7 mm mild chiari malformation, h/o HAs. She has been able to eat and drink. She still feels the baby kicking, he is moving constantly. NO feers, chills, URI or UTI sxs. She is very sensitive to medications.   Clinical Data: Headache  MRI HEAD WITHOUT CONTRAST  Technique: Multiplanar, multiecho pulse sequences of the brain and  surrounding structures were obtained according to standard protocol  without intravenous contrast.  Comparison: CT 04/26/2003, MRI report 09/26/1999  Findings: Ventricle size is normal. Cerebellar tonsils are low-  lying approximately 7 mm below the foramen magnum. This is  compatible with a mild Chiari malformation. No syrinx is  identified in the cervical spinal cord.  Negative for cerebral infarction. Negative for demyelinating  disease. Negative for mass or edema. Brainstem is normal.  IMPRESSION:  Mild Chiari malformation, 7 mm.  Otherwise normal  Provider: Luiz Ochoa   Past Medical History  Diagnosis Date  . Anxiety and depression   . Panic attack   . Herpes labialis   . Migraine   . Allergic rhinitis   . Chiari malformation     7 mm   Past Surgical History  Procedure Laterality Date  . Tonsillectomy and adenoidectomy  2001  . Wisdom tooth extraction     History    Social History  . Marital Status: Married    Spouse Name: [name]    Number of Children: 0  . Years of Education: 14   Occupational History  . 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 Smoking status: Former Smoker -- 0.20 packs/day for 6 years    Types: Cigarettes  . Smokeless tobacco: Never Used     Comment: down from 1 PPD  . Alcohol Use: No  . Drug Use: No  . Sexual Activity: Yes    Partners: Male   Other Topics Concern  . None   Social History Narrative   Lives with husband.   Caffeine Use: 40mg /day   History reviewed. No pertinent family history. Allergies  Allergen Reactions  . Lamictal [Lamotrigine] Anaphylaxis  . Vancomycin Other (See Comments)    Feels hot   Prior to Admission medications   Medication Sig Start Date End Date Taking? Authorizing Provider  busPIRone (BUSPAR) 5 MG tablet Take 1 tablet (5 mg total) by mouth 2 (two) times daily. 07/08/13  Yes Mancel Bale, PA-C  citalopram (CELEXA) 20 MG tablet Take 1 tablet (20 mg total) by mouth daily. 07/08/13  Yes Mancel Bale, PA-C  Prenatal Vit-Fe Fumarate-FA (MULTIVITAMIN-PRENATAL) 27-0.8 MG TABS Take 1 tablet by mouth at bedtime.   Yes Historical Provider, MD  Ranitidine HCl (ZANTAC 75 PO) Take by mouth.  Yes Historical Provider, MD     ROS: The patient denies fevers, chills, night sweats, unintentional weight loss, chest pain, palpitations, wheezing, dyspnea on exertion,  vomiting, abdominal pain, dysuria, hematuria, melena,   All other systems have been reviewed and were otherwise negative with the exception of those mentioned in the HPI and as above.    PHYSICAL EXAM: Filed Vitals:   08/26/13 1052  BP: 90/56  Pulse: 93  Temp: 98 F (36.7 C)  Resp: 16   Filed Vitals:   08/26/13 1052  Weight: 160 lb (72.576 kg)   Body mass index is 24.5 kg/(m^2).  General: Alert, mild distress, anxious HEENT:  Normocephalic, atraumatic, oropharynx patent. EOMI, PERRLA, fundo  exam nl, TM nl, no exudates Cardiovascular:  Regular rate and rhythm, no rubs murmurs or gallops.  No Carotid bruits, radial pulse intact. No pedal edema.  Respiratory: Clear to auscultation bilaterally.  No wheezes, rales, or rhonchi.  No cyanosis, no use of accessory musculature GI: No organomegaly, abdomen is soft and non-tender, positive bowel sounds.  No masses. Skin: No rashes. Neurologic: Facial musculature symmetric. Psychiatric: Patient is appropriate throughout our interaction. Lymphatic: No cervical lymphadenopathy Musculoskeletal: Gait intact. She is steady when she is moving slowly, 5/5 str   LABS: Results for orders placed in visit on 08/26/13  POCT CBC      Result Value Ref Range   WBC 9.3  4.6 - 10.2 K/uL   Lymph, poc 2.6  0.6 - 3.4   POC LYMPH PERCENT 27.7  10 - 50 %L   MID (cbc) 0.5  0 - 0.9   POC MID % 5.4  0 - 12 %M   POC Granulocyte 6.2  2 - 6.9   Granulocyte percent 66.9  37 - 80 %G   RBC 3.89 (*) 4.04 - 5.48 M/uL   Hemoglobin 11.3 (*) 12.2 - 16.2 g/dL   HCT, POC 35.4 (*) 37.7 - 47.9 %   MCV 91.1  80 - 97 fL   MCH, POC 29.0  27 - 31.2 pg   MCHC 31.9  31.8 - 35.4 g/dL   RDW, POC 13.4     Platelet Count, POC 212  142 - 424 K/uL   MPV 7.9  0 - 99.8 fL  GLUCOSE, POCT (MANUAL RESULT ENTRY)      Result Value Ref Range   POC Glucose 88  70 - 99 mg/dl  POCT UA - MICROSCOPIC ONLY      Result Value Ref Range   WBC, Ur, HPF, POC 0-3     RBC, urine, microscopic 0-1     Bacteria, U Microscopic 1+     Mucus, UA positive     Epithelial cells, urine per micros 3-5     Crystals, Ur, HPF, POC neg     Casts, Ur, LPF, POC neg     Yeast, UA neg    POCT URINALYSIS DIPSTICK      Result Value Ref Range   Color, UA lght yellow     Clarity, UA clear     Glucose, UA neg     Bilirubin, UA neg     Ketones, UA neg     Spec Grav, UA <=1.005     Blood, UA neg     pH, UA 7.0     Protein, UA neg     Urobilinogen, UA 0.2     Nitrite, UA neg     Leukocytes, UA Negative        EKG/XRAY:  Primary read interpreted by Dr. Marin Comment at [facility].   ASSESSMENT/PLAN: Encounter Diagnoses  Name Primary?  . Dizziness and giddiness Yes  . Hypotension, unspecified   . Nausea alone    G1L0 28 y/o female at roughly [redacted] weeks gestation Here for acute onset of dizziness x 2 days, worse today, she was recently started on nifedipine 10 mg nightlty for preterm contractions, has taken only 1 dose of it  Possible SE from nifedipine and also pregnancy, I was unable tor each Dr Algie Coffer but did speak with her nurse and have updated them in the situation, ok to dc nifedipine and not start patient on any new meds.  Will take her nausea medication she already has, push fluids She was given IVF x 1 L in office, BP went up. She still felt nauseated. Advise to take what she has at home since it works for her and she is very sensitive to medications OOW note for 2 days Follow-up with ob/gyn within 1 week.  Stop Nifedipine F/u prn, precautions given to go to ER    Glenford Bayley, DO 08/26/2013 1:26 PM

## 2013-08-27 ENCOUNTER — Telehealth: Payer: Self-pay | Admitting: Family Medicine

## 2013-08-27 NOTE — Telephone Encounter (Signed)
Spoke with mother (on [facility]) about lab results, low sodium, will need to recheck if still having worsening dizziness, sodium was 134. If not then no need since she is not fond of needles and blood.

## 2013-09-23 ENCOUNTER — Encounter: Payer: 59 | Attending: Obstetrics & Gynecology

## 2013-09-23 VITALS — Ht 67.75 in | Wt 166.5 lb

## 2013-09-23 DIAGNOSIS — O99810 Abnormal glucose complicating pregnancy: Secondary | ICD-10-CM | POA: Insufficient documentation

## 2013-09-23 DIAGNOSIS — Z713 Dietary counseling and surveillance: Secondary | ICD-10-CM | POA: Insufficient documentation

## 2013-09-24 NOTE — Progress Notes (Signed)
  Patient was seen on 09/23/13 for Gestational Diabetes self-management class at the [facility]. The following learning objectives were met by the patient during this course:   States the definition of Gestational Diabetes  States why dietary management is important in controlling blood glucose  Describes the effects of carbohydrates on blood glucose levels  Demonstrates ability to create a balanced meal plan  Demonstrates carbohydrate counting   States when to check blood glucose levels  Demonstrates proper blood glucose monitoring techniques  States the effect of stress and exercise on blood glucose levels  States the importance of limiting caffeine and abstaining from alcohol and smoking  Plan:  Aim for 2 Carb Choices per meal (30 grams) +/- 1 either way for breakfast Aim for 3 Carb Choices per meal (45 grams) +/- 1 either way from lunch and dinner Aim for 1-2 Carbs per snack Begin reading food labels for Total Carbohydrate and sugar grams of foods Consider  increasing your activity level by walking daily as tolerated Begin checking BG before breakfast and 1-2 hours after first bit of breakfast, lunch and dinner after  as directed by MD  Take medication  as directed by MD  Blood glucose monitor given: Accu Chek Nano BG Monitoring Kit Lot # [id number] Exp: 08/19/2014 Blood glucose reading: 24m/dl  Patient instructed to monitor glucose levels: FBS: 60 - <90 2 hour: <120  Patient received the following handouts:  Nutrition Diabetes and Pregnancy  Carbohydrate Counting List  Meal Planning worksheet  Patient will be seen for follow-up as needed.

## 2013-10-26 LAB — OB RESULTS CONSOLE GBS: GBS: NEGATIVE

## 2013-11-13 ENCOUNTER — Encounter (HOSPITAL_COMMUNITY): Payer: Self-pay | Admitting: *Deleted

## 2013-11-13 ENCOUNTER — Inpatient Hospital Stay (HOSPITAL_COMMUNITY)
Admission: AD | Admit: 2013-11-13 | Discharge: 2013-11-13 | Disposition: A | Discharge status: Home / Self Care | Payer: 59 | Source: Ambulatory Visit | Attending: Obstetrics and Gynecology | Admitting: Obstetrics and Gynecology

## 2013-11-13 DIAGNOSIS — O479 False labor, unspecified: Secondary | ICD-10-CM | POA: Insufficient documentation

## 2013-11-13 NOTE — MAU Note (Signed)
Contractions since lunch. Closer and stronger. Dr Benjie Karvonen checked me today and was 1cm 90percent

## 2013-11-13 NOTE — Discharge Instructions (Signed)
Braxton Hicks Contractions °Contractions of the uterus can occur throughout pregnancy. Contractions are not always a sign that you are in labor.  °WHAT ARE BRAXTON HICKS CONTRACTIONS?  °Contractions that occur before labor are called Braxton Hicks contractions, or false labor. Toward the end of pregnancy (32-34 weeks), these contractions can develop more often and may become more forceful. This is not true labor because these contractions do not result in opening (dilatation) and thinning of the cervix. They are sometimes difficult to tell apart from true labor because these contractions can be forceful and people have different pain tolerances. You should not feel embarrassed if you go to the hospital with false labor. Sometimes, the only way to tell if you are in true labor is for your health care provider to look for changes in the cervix. °If there are no prenatal problems or other health problems associated with the pregnancy, it is completely safe to be sent home with false labor and await the onset of true labor. °HOW CAN YOU TELL THE DIFFERENCE BETWEEN TRUE AND FALSE LABOR? °False Labor °· The contractions of false labor are usually shorter and not as hard as those of true labor.   °· The contractions are usually irregular.   °· The contractions are often felt in the front of the lower abdomen and in the groin.   °· The contractions may go away when you walk around or change positions while lying down.   °· The contractions get weaker and are shorter lasting as time goes on.   °· The contractions do not usually become progressively stronger, regular, and closer together as with true labor.   °True Labor °· Contractions in true labor last 30-70 seconds, become very regular, usually become more intense, and increase in frequency.   °· The contractions do not go away with walking.   °· The discomfort is usually felt in the top of the uterus and spreads to the lower abdomen and low back.   °· True labor can be  determined by your health care provider with an exam. This will show that the cervix is dilating and getting thinner.   °WHAT TO REMEMBER °· Keep up with your usual exercises and follow other instructions given by your health care provider.   °· Take medicines as directed by your health care provider.   °· Keep your regular prenatal appointments.   °· Eat and drink lightly if you think you are going into labor.   °· If Braxton Hicks contractions are making you uncomfortable:   °¨ Change your position from lying down or resting to walking, or from walking to resting.   °¨ Sit and rest in a tub of warm water.   °¨ Drink 2-3 glasses of water. Dehydration may cause these contractions.   °¨ Do slow and deep breathing several times an hour.   °WHEN SHOULD I SEEK IMMEDIATE MEDICAL CARE? °Seek immediate medical care if: °· Your contractions become stronger, more regular, and closer together.   °· You have fluid leaking or gushing from your vagina.   °· You have a fever.   °· You pass blood-tinged mucus.   °· You have vaginal bleeding.   °· You have continuous abdominal pain.   °· You have low back pain that you never had before.   °· You feel your baby's head pushing down and causing pelvic pressure.   °· Your baby is not moving as much as it used to.   °Document Released: 05/07/2005 Document Revised: 05/12/2013 Document Reviewed: 02/16/2013 °ExitCare® Patient Information ©2015 [employer]. This information is not intended to replace advice given to you by your health care   provider. Make sure you discuss any questions you have with your health care provider. ° °

## 2013-11-14 ENCOUNTER — Encounter (HOSPITAL_COMMUNITY): Payer: 59 | Admitting: Anesthesiology

## 2013-11-14 ENCOUNTER — Encounter (HOSPITAL_COMMUNITY): Payer: Self-pay | Admitting: *Deleted

## 2013-11-14 ENCOUNTER — Inpatient Hospital Stay (HOSPITAL_COMMUNITY)
Admission: AD | Admit: 2013-11-14 | Discharge: 2013-11-16 | DRG: 774 | Disposition: A | Discharge status: Home / Self Care | Payer: 59 | Source: Ambulatory Visit | Attending: Obstetrics and Gynecology | Admitting: Obstetrics and Gynecology

## 2013-11-14 ENCOUNTER — Inpatient Hospital Stay (HOSPITAL_COMMUNITY): Payer: 59 | Admitting: Anesthesiology

## 2013-11-14 DIAGNOSIS — O99814 Abnormal glucose complicating childbirth: Secondary | ICD-10-CM | POA: Diagnosis present

## 2013-11-14 DIAGNOSIS — B009 Herpesviral infection, unspecified: Secondary | ICD-10-CM | POA: Diagnosis present

## 2013-11-14 DIAGNOSIS — Z833 Family history of diabetes mellitus: Secondary | ICD-10-CM

## 2013-11-14 DIAGNOSIS — O9903 Anemia complicating the puerperium: Secondary | ICD-10-CM | POA: Diagnosis present

## 2013-11-14 DIAGNOSIS — O99344 Other mental disorders complicating childbirth: Secondary | ICD-10-CM | POA: Diagnosis present

## 2013-11-14 DIAGNOSIS — O98519 Other viral diseases complicating pregnancy, unspecified trimester: Secondary | ICD-10-CM | POA: Diagnosis present

## 2013-11-14 DIAGNOSIS — Z87891 Personal history of nicotine dependence: Secondary | ICD-10-CM

## 2013-11-14 DIAGNOSIS — F341 Dysthymic disorder: Secondary | ICD-10-CM | POA: Diagnosis present

## 2013-11-14 DIAGNOSIS — IMO0001 Reserved for inherently not codable concepts without codable children: Secondary | ICD-10-CM

## 2013-11-14 DIAGNOSIS — D62 Acute posthemorrhagic anemia: Secondary | ICD-10-CM | POA: Diagnosis present

## 2013-11-14 HISTORY — DX: Gestational diabetes mellitus in pregnancy, unspecified control: O24.419

## 2013-11-14 LAB — CBC
HCT: 33.9 % — ABNORMAL LOW (ref 36.0–46.0)
Hemoglobin: 11.3 g/dL — ABNORMAL LOW (ref 12.0–15.0)
MCH: 27.5 pg (ref 26.0–34.0)
MCHC: 33.3 g/dL (ref 30.0–36.0)
MCV: 82.5 fL (ref 78.0–100.0)
Platelets: 167 10*3/uL (ref 150–400)
RBC: 4.11 MIL/uL (ref 3.87–5.11)
RDW: 13.3 % (ref 11.5–15.5)
WBC: 13 10*3/uL — ABNORMAL HIGH (ref 4.0–10.5)

## 2013-11-14 LAB — GLUCOSE, CAPILLARY: Glucose-Capillary: 105 mg/dL — ABNORMAL HIGH (ref 70–99)

## 2013-11-14 LAB — RPR

## 2013-11-14 MED ORDER — LACTATED RINGERS IV SOLN: INTRAVENOUS | Status: DC

## 2013-11-14 MED ORDER — ONDANSETRON HCL 4 MG/2ML IJ SOLN: 4.0000 mg | INTRAMUSCULAR | Status: DC | PRN

## 2013-11-14 MED ORDER — SIMETHICONE 80 MG PO CHEW: 80.0000 mg | CHEWABLE_TABLET | ORAL | Status: DC | PRN

## 2013-11-14 MED ORDER — PHENYLEPHRINE 40 MCG/ML (10ML) SYRINGE FOR IV PUSH (FOR BLOOD PRESSURE SUPPORT)
80.0000 ug | PREFILLED_SYRINGE | INTRAVENOUS | Status: DC | PRN
  Filled 2013-11-14 (×2): qty 10

## 2013-11-14 MED ORDER — OXYTOCIN BOLUS FROM INFUSION: 500.0000 mL | INTRAVENOUS | Status: DC

## 2013-11-14 MED ORDER — LANOLIN HYDROUS EX OINT: TOPICAL_OINTMENT | CUTANEOUS | Status: DC | PRN

## 2013-11-14 MED ORDER — BUTORPHANOL TARTRATE 1 MG/ML IJ SOLN
1.0000 mg | INTRAMUSCULAR | Status: DC | PRN
  Administered 2013-11-14: 1 mg via INTRAVENOUS
  Filled 2013-11-14: qty 1

## 2013-11-14 MED ORDER — TERBUTALINE SULFATE 1 MG/ML IJ SOLN: 0.2500 mg | Freq: Once | INTRAMUSCULAR | Status: DC | PRN

## 2013-11-14 MED ORDER — OXYCODONE-ACETAMINOPHEN 5-325 MG PO TABS: 1.0000 | ORAL_TABLET | ORAL | Status: DC | PRN

## 2013-11-14 MED ORDER — ONDANSETRON HCL 4 MG/2ML IJ SOLN
4.0000 mg | Freq: Four times a day (QID) | INTRAMUSCULAR | Status: DC | PRN
Start: 2013-11-14 — End: 2013-11-14

## 2013-11-14 MED ORDER — IBUPROFEN 600 MG PO TABS: 600.0000 mg | ORAL_TABLET | Freq: Four times a day (QID) | ORAL | Status: DC | PRN

## 2013-11-14 MED ORDER — OXYTOCIN 40 UNITS IN LACTATED RINGERS INFUSION - SIMPLE MED
1.0000 m[IU]/min | INTRAVENOUS | Status: DC
Start: 2013-11-14 — End: 2013-11-14
  Administered 2013-11-14: 2 m[IU]/min via INTRAVENOUS
  Filled 2013-11-14: qty 1000

## 2013-11-14 MED ORDER — LIDOCAINE HCL (PF) 1 % IJ SOLN
INTRAMUSCULAR | Status: DC | PRN
  Administered 2013-11-14 (×2): 8 mL
  Administered 2013-11-14: 10 mL

## 2013-11-14 MED ORDER — OXYTOCIN 40 UNITS IN LACTATED RINGERS INFUSION - SIMPLE MED
1.0000 m[IU]/min | INTRAVENOUS | Status: DC
  Administered 2013-11-14: 1.5 m[IU]/min via INTRAVENOUS

## 2013-11-14 MED ORDER — ACETAMINOPHEN 325 MG PO TABS: 650.0000 mg | ORAL_TABLET | ORAL | Status: DC | PRN

## 2013-11-14 MED ORDER — BUTORPHANOL TARTRATE 1 MG/ML IJ SOLN
INTRAMUSCULAR | Status: AC
  Filled 2013-11-14: qty 1

## 2013-11-14 MED ORDER — PRENATAL MULTIVITAMIN CH
1.0000 | ORAL_TABLET | Freq: Every day | ORAL | Status: DC
  Administered 2013-11-15 – 2013-11-16 (×2): 1 via ORAL
  Filled 2013-11-14 (×2): qty 1

## 2013-11-14 MED ORDER — WITCH HAZEL-GLYCERIN EX PADS
1.0000 "application " | MEDICATED_PAD | CUTANEOUS | Status: DC | PRN
  Administered 2013-11-15: 1 via TOPICAL

## 2013-11-14 MED ORDER — FENTANYL 2.5 MCG/ML BUPIVACAINE 1/10 % EPIDURAL INFUSION (WH - ANES)
14.0000 mL/h | INTRAMUSCULAR | Status: DC | PRN
  Administered 2013-11-14: 14 mL/h via EPIDURAL

## 2013-11-14 MED ORDER — ONDANSETRON HCL 4 MG PO TABS: 4.0000 mg | ORAL_TABLET | ORAL | Status: DC | PRN

## 2013-11-14 MED ORDER — EPHEDRINE 5 MG/ML INJ
10.0000 mg | INTRAVENOUS | Status: DC | PRN
  Filled 2013-11-14: qty 4

## 2013-11-14 MED ORDER — ZOLPIDEM TARTRATE 5 MG PO TABS: 5.0000 mg | ORAL_TABLET | Freq: Every evening | ORAL | Status: DC | PRN

## 2013-11-14 MED ORDER — BENZOCAINE-MENTHOL 20-0.5 % EX AERO
1.0000 "application " | INHALATION_SPRAY | CUTANEOUS | Status: DC | PRN
  Administered 2013-11-14: 1 via TOPICAL
  Filled 2013-11-14: qty 56

## 2013-11-14 MED ORDER — FERROUS SULFATE 325 (65 FE) MG PO TABS
325.0000 mg | ORAL_TABLET | Freq: Two times a day (BID) | ORAL | Status: DC
  Administered 2013-11-14 – 2013-11-16 (×4): 325 mg via ORAL
  Filled 2013-11-14 (×5): qty 1

## 2013-11-14 MED ORDER — FLEET ENEMA 7-19 GM/118ML RE ENEM: 1.0000 | ENEMA | RECTAL | Status: DC | PRN

## 2013-11-14 MED ORDER — SENNOSIDES-DOCUSATE SODIUM 8.6-50 MG PO TABS
2.0000 | ORAL_TABLET | ORAL | Status: DC
  Administered 2013-11-14 – 2013-11-15 (×2): 2 via ORAL
  Filled 2013-11-14 (×2): qty 2

## 2013-11-14 MED ORDER — DIBUCAINE 1 % RE OINT
1.0000 "application " | TOPICAL_OINTMENT | RECTAL | Status: DC | PRN
  Administered 2013-11-15: 1 via RECTAL
  Filled 2013-11-14: qty 28

## 2013-11-14 MED ORDER — PHENYLEPHRINE 40 MCG/ML (10ML) SYRINGE FOR IV PUSH (FOR BLOOD PRESSURE SUPPORT): 80.0000 ug | PREFILLED_SYRINGE | INTRAVENOUS | Status: DC | PRN

## 2013-11-14 MED ORDER — CITALOPRAM HYDROBROMIDE 20 MG PO TABS
20.0000 mg | ORAL_TABLET | Freq: Every day | ORAL | Status: DC
  Administered 2013-11-14: 20 mg via ORAL
  Filled 2013-11-14: qty 1

## 2013-11-14 MED ORDER — DIPHENHYDRAMINE HCL 25 MG PO CAPS: 25.0000 mg | ORAL_CAPSULE | Freq: Four times a day (QID) | ORAL | Status: DC | PRN

## 2013-11-14 MED ORDER — IBUPROFEN 600 MG PO TABS
600.0000 mg | ORAL_TABLET | Freq: Four times a day (QID) | ORAL | Status: DC
  Administered 2013-11-14 – 2013-11-16 (×8): 600 mg via ORAL
  Filled 2013-11-14 (×9): qty 1

## 2013-11-14 MED ORDER — CITRIC ACID-SODIUM CITRATE 334-500 MG/5ML PO SOLN: 30.0000 mL | ORAL | Status: DC | PRN

## 2013-11-14 MED ORDER — DIPHENHYDRAMINE HCL 50 MG/ML IJ SOLN: 12.5000 mg | INTRAMUSCULAR | Status: DC | PRN

## 2013-11-14 MED ORDER — OXYCODONE-ACETAMINOPHEN 5-325 MG PO TABS
1.0000 | ORAL_TABLET | ORAL | Status: DC | PRN
  Administered 2013-11-14 (×2): 2 via ORAL
  Filled 2013-11-14: qty 2
  Filled 2013-11-14: qty 1
  Filled 2013-11-14: qty 2

## 2013-11-14 MED ORDER — CITALOPRAM HYDROBROMIDE 20 MG PO TABS
20.0000 mg | ORAL_TABLET | Freq: Every day | ORAL | Status: DC
  Administered 2013-11-15 – 2013-11-16 (×2): 20 mg via ORAL
  Filled 2013-11-14 (×4): qty 1

## 2013-11-14 MED ORDER — BUTORPHANOL TARTRATE 1 MG/ML IJ SOLN
1.0000 mg | Freq: Once | INTRAMUSCULAR | Status: AC
  Administered 2013-11-14: 1 mg via INTRAVENOUS

## 2013-11-14 MED ORDER — LACTATED RINGERS IV SOLN: 500.0000 mL | INTRAVENOUS | Status: DC | PRN

## 2013-11-14 MED ORDER — FENTANYL 2.5 MCG/ML BUPIVACAINE 1/10 % EPIDURAL INFUSION (WH - ANES)
14.0000 mL/h | INTRAMUSCULAR | Status: DC | PRN
  Administered 2013-11-14: 14 mL/h via EPIDURAL
  Filled 2013-11-14 (×2): qty 125

## 2013-11-14 MED ORDER — LACTATED RINGERS IV SOLN: 500.0000 mL | Freq: Once | INTRAVENOUS | Status: DC

## 2013-11-14 MED ORDER — OXYTOCIN 40 UNITS IN LACTATED RINGERS INFUSION - SIMPLE MED: 62.5000 mL/h | INTRAVENOUS | Status: DC

## 2013-11-14 MED ORDER — LIDOCAINE HCL (PF) 1 % IJ SOLN
30.0000 mL | INTRAMUSCULAR | Status: DC | PRN
  Filled 2013-11-14 (×2): qty 30

## 2013-11-14 NOTE — Anesthesia Preprocedure Evaluation (Addendum)
Anesthesia Evaluation  Patient identified by MRN, date of birth, ID band Patient awake    Reviewed: Allergy & Precautions, H&P , Patient's Chart, lab work & pertinent test results  Airway Mallampati: II TM Distance: >3 FB Neck ROM: full    Dental  (+) Teeth Intact   Pulmonary former smoker,  breath sounds clear to auscultation        Cardiovascular Rhythm:regular Rate:Normal     Neuro/Psych    GI/Hepatic   Endo/Other  diabetes  Renal/GU      Musculoskeletal   Abdominal   Peds  Hematology   Anesthesia Other Findings       Reproductive/Obstetrics (+) Pregnancy                           Anesthesia Physical Anesthesia Plan  ASA: II  Anesthesia Plan: Epidural   Post-op Pain Management:    Induction:   Airway Management Planned:   Additional Equipment:   Intra-op Plan:   Post-operative Plan:   Informed Consent: I have reviewed the patients History and Physical, chart, labs and discussed the procedure including the risks, benefits and alternatives for the proposed anesthesia with the patient or authorized representative who has indicated his/her understanding and acceptance.   Dental Advisory Given  Plan Discussed with:   Anesthesia Plan Comments: (Labs checked- platelets confirmed with RN in room. Fetal heart tracing, per RN, reported to be stable enough for sitting procedure. Discussed epidural, and patient consents to the procedure:  included risk of possible headache,backache, failed block, allergic reaction, and nerve injury. This patient was asked if she had any questions or concerns before the procedure started.)        Anesthesia Quick Evaluation

## 2013-11-14 NOTE — Progress Notes (Signed)
S: Pushing  O: VE: fully since 9:08 am. (+) 2 station Tracing: baseline 140-145 Ctx q 2-3 mins  IMP: Complete P) anticipate NSVD

## 2013-11-14 NOTE — Progress Notes (Signed)
Terri Wilson is a 28 y.o. G1P0000 at [redacted]w[redacted]d by LMP admitted for rupture of membranes  Subjective: No chief complaint on file.  C/o pain despite Epidural  Objective: BP 121/85  Pulse 111  Temp(Src) 98 F (36.7 C) (Oral)  Resp 20  Ht 5\' 8"  (1.727 m)  Wt 75.751 kg (167 lb)  BMI 25.40 kg/m2  SpO2 97%  LMP 02/18/2013      FHT:  FHR: 130 bpm, variability: moderate,  accelerations:  Present,  decelerations:  Absent UC:   irregular, every 2-4 minutes SVE:   4 cm dilated, 90% effaced, -1 station edematous from 9 to 1 . Asynclitic LOP Tracing: cat 1  Labs: Lab Results  Component Value Date   WBC 13.0* 11/14/2013   HGB 11.3* 11/14/2013   HCT 33.9* 11/14/2013   MCV 82.5 11/14/2013   PLT 167 11/14/2013    Assessment / Plan: Arrest in active phase of labor due to posterior presentation IUP @ 38 3/7 weeks SROM GBS cx neg  P) get pt more comfortable. Foley. Exaggerated right sims position w/ peanut ball. Pitocin once comfortable   Anticipated MOD:  NSVD  Amalio Loe A 11/14/2013, 5:53 AM

## 2013-11-14 NOTE — MAU Note (Signed)
Pt. Was here at MAU earlier with uc's and sent home.  When pt. Got home her water broke, so she came back to [facility] to get checked out

## 2013-11-14 NOTE — Anesthesia Procedure Notes (Addendum)
Epidural Patient location during procedure: OB  Preanesthetic Checklist Completed: patient identified, site marked, surgical consent, pre-op evaluation, timeout performed, IV checked, risks and benefits discussed and monitors and equipment checked  Epidural Patient position: sitting Prep: site prepped and draped and DuraPrep Patient monitoring: continuous pulse ox and blood pressure Approach: midline Injection technique: LOR air  Needle:  Needle type: Tuohy  Needle gauge: 17 G Needle length: 9 cm and 9 Needle insertion depth: 4 cm Catheter type: closed end flexible Catheter size: 19 Gauge Catheter at skin depth: 10 cm Test dose: negative  Assessment Events: blood not aspirated, injection not painful, no injection resistance, negative IV test and no paresthesia  Additional Notes Dosing of Epidural:  1st dose, through catheter ............................................Marland Kitchen  Xylocaine 40 mg  2nd dose, through catheter, after waiting 3 minutes........Marland KitchenXylocaine 60 mg    ( 1% Xylo charted as a single dose in Epic Meds for ease of charting; actual dosing was fractionated as above, for saftey's sake)  As each dose occurred, patient was free of IV sx; and patient exhibited no evidence of SA injection.  Patient is more comfortable after epidural dosed. Please see RN's note for documentation of vital signs,and FHR which are stable.  Patient reminded not to try to ambulate with numb legs, and that an RN must be present when she attempts to get up.      Epidural Patient location during procedure: OB Start time: 11/14/2013 7:34 AM End time: 11/14/2013 7:37 AM  Staffing Anesthesiologist: Lyn Hollingshead Performed by: anesthesiologist   Preanesthetic Checklist Completed: patient identified, surgical consent, pre-op evaluation, timeout performed, IV checked, risks and benefits discussed and monitors and equipment checked  Epidural Patient position: sitting Prep: site  prepped and draped and DuraPrep Patient monitoring: continuous pulse ox and blood pressure Approach: midline Location: L3-L4 Injection technique: LOR air  Needle:  Needle type: Tuohy  Needle gauge: 17 G Needle length: 9 cm and 9 Needle insertion depth: 4 cm Catheter type: closed end flexible Catheter size: 19 Gauge Catheter at skin depth: 9 cm Test dose: negative and Other  Assessment Sensory level: T9 Events: blood not aspirated, injection not painful, no injection resistance, negative IV test and no paresthesia  Additional Notes Reason for block:procedure for pain

## 2013-11-14 NOTE — H&P (Signed)
Terri Wilson is a 28 y.o. female presenting for admission due to SROm clear fluid. GBS cx negative. Pt c/o very painful ctxs. Pt has had no cervical change since 6/24 office visit Maternal Medical History:  Reason for admission: Rupture of membranes and contractions.   Contractions: Frequency: irregular.    Fetal activity: Perceived fetal activity is normal.    Prenatal complications: Class V5IEP    OB History   Grav Para Term Preterm Abortions TAB SAB Ect Mult Living   1 0 0 0 0 0 0 0 0 0      Past Medical History  Diagnosis Date  . Anxiety and depression   . Panic attack   . Herpes labialis   . Migraine   . Allergic rhinitis   . Chiari malformation     7 mm  . Depression   . Gestational diabetes mellitus, antepartum   . Gestational diabetes    Past Surgical History  Procedure Laterality Date  . Tonsillectomy and adenoidectomy  2001  . Wisdom tooth extraction     Family History: family history includes Asthma in her other; Cancer in her other; Diabetes in her other; Heart disease in her other; Hyperlipidemia in her other; Hypertension in her other; Obesity in her other. Social History:  reports that she has quit smoking. Her smoking use included Cigarettes. She has a 1.2 pack-year smoking history. She has never used smokeless tobacco. She reports that she does not drink alcohol or use illicit drugs.   Prenatal Transfer Tool  Maternal Diabetes: Yes:  Diabetes Type:  Diet controlled Genetic Screening: Normal Maternal Ultrasounds/Referrals: Abnormal:  Findings:   Isolated EIF (echogenic intracardiac focus) Fetal Ultrasounds or other Referrals:  None Maternal Substance Abuse:  No Significant Maternal Medications:  Meds include: Zantac Other:  Significant Maternal Lab Results:  Lab values include: Group B Strep negative Other Comments:  med: Celexa Pt has mild cerebellar tonsillar ectopia( nl variant) NOT chiari malformation  ROS neg  Dilation: 1 Effacement (%):  80 Station: -2 Exam by:: Sula Rumple RN Blood pressure 126/84, pulse 94, temperature 98.2 F (36.8 C), height 5\' 8"  (1.727 m), weight 75.751 kg (167 lb), last menstrual period 02/18/2013. Maternal Exam:  Uterine Assessment: Contraction frequency is irregular.   Abdomen: Patient reports no abdominal tenderness. Estimated fetal weight is 8 1/2 lb.   Fetal presentation: vertex  Introitus: Normal vulva. Amniotic fluid character: clear.  Pelvis: adequate for delivery.   Cervix: Cervix evaluated by digital exam.     Fetal Exam Fetal State Assessment: Category I - tracings are normal.     Physical Exam  Constitutional: She is oriented to person, place, and time. She appears well-developed and well-nourished.  HENT:  Head: Atraumatic.  Eyes: EOM are normal.  Neck: Neck supple.  Cardiovascular: Normal rate and regular rhythm.   Respiratory: Breath sounds normal.  GI: Soft.  Musculoskeletal: She exhibits edema.  Neurological: She is alert and oriented to person, place, and time.  Skin: Skin is warm and dry.  Psychiatric: She has a normal mood and affect.    Prenatal labs: ABO, Rh:  O positive Antibody:  neg Rubella:  Immune RPR:   NR HBsAg:   neg HIV:   NR GBS:    neg  Assessment/Plan: SROM Prodromal labor IUP @ 383/7 weeks Class A1GDM P) admit routine labs. Pitocin prn analgesic prn. BS q 4 hrs  COUSINS,SHERONETTE A 11/14/2013, 2:26 AM

## 2013-11-15 LAB — CBC
HCT: 29.3 % — ABNORMAL LOW (ref 36.0–46.0)
Hemoglobin: 9.5 g/dL — ABNORMAL LOW (ref 12.0–15.0)
MCH: 27.1 pg (ref 26.0–34.0)
MCHC: 32.4 g/dL (ref 30.0–36.0)
MCV: 83.5 fL (ref 78.0–100.0)
Platelets: 145 10*3/uL — ABNORMAL LOW (ref 150–400)
RBC: 3.51 MIL/uL — ABNORMAL LOW (ref 3.87–5.11)
RDW: 13.6 % (ref 11.5–15.5)
WBC: 17.2 10*3/uL — ABNORMAL HIGH (ref 4.0–10.5)

## 2013-11-15 LAB — ABO/RH: ABO/RH(D): O POS

## 2013-11-15 NOTE — Progress Notes (Signed)
PPD #1- SVD  Subjective:   Reports feeling well Tolerating po/ No nausea or vomiting Bleeding is light Pain controlled with Motrin Up ad lib / ambulatory / voiding without problems Newborn: breastfeeding  / Circumcision: done   Objective:   VS:  VS:  Filed Vitals:   11/14/13 1500 11/14/13 1600 11/14/13 1959 11/15/13 0535  BP: 114/78 103/69 106/81 100/64  Pulse: 116 96 91 102  Temp: 97.8 F (36.6 C) 98.2 F (36.8 C) 97.7 F (36.5 C) 98.4 F (36.9 C)  TempSrc: Oral Oral Oral Oral  Resp: 18 18 18 18   Height:      Weight:      SpO2:        LABS:  Recent Labs  11/14/13 0215 11/15/13 0600  WBC 13.0* 17.2*  HGB 11.3* 9.5*  PLT 167 145*   Blood type: O/Positive/-- (12/05 0000) Rubella: Immune (12/05 0000)   I&O: Intake/Output     06/27 0701 - 06/28 0700 06/28 0701 - 06/29 0700   Blood 350    Total Output 350     Net -350            Physical Exam: Alert and oriented x3 Abdomen: soft, non-tender, non-distended  Fundus: firm, non-tender, U-1 Perineum: Well approximated, no significant erythema, edema, or drainage; healing well. Lochia: small Extremities: no edema, no calf pain or tenderness    Assessment:  PPD # 1G1P1001/ S/P:spontaneous vaginal, first degree laceration, sub-clitoral laceration Depression-stable on Celexa IDA with compounding ABL anemia  Doing well    Plan: Continue routine post partum orders Anticipate D/C home tomorrow   Julianne Handler, N MSN, CNM 11/15/2013, 11:10 AM

## 2013-11-15 NOTE — Lactation Note (Signed)
This note was copied from the chart of Terri Wilson. Lactation Consultation Note  Initial consult:  Mother's right nipple cracked, scab, sore.  Left nipple pink and sore. Reviewed hand expression, drops expressed.  Reviewed reverse pressure. Mother applied #20NS on right breast and baby sucked but mother could not tolerate pain. Attempted on the left with NS.  Sucks observed but mother could not tolerate pain for more than 10 min. Set up DEBP, reviewed cleaning and milk storage. Mother was able to pump 51ml of colostrum which was fed to baby with curved tip syringe. Demonstrated also how to use foley cup. Plan is for mother to pump with DEBP 15 min every 3 hours until she feels she can tolerate breastfeeding. Provided mother with shells to wear.  Reviewed applying ebm for soreness and mother has comfort gels. Encouraged mother to call for further assistance.   Patient Name: Terri Wilson [id number] Date: 11/15/2013 Reason for consult: Initial assessment   Maternal Data Has patient been taught Hand Expression?: Yes Does the patient have breastfeeding experience prior to this delivery?: No  Feeding Feeding Type: Breast Fed Length of feed: 35 min  LATCH Score/Interventions Latch: Repeated attempts needed to sustain latch, nipple held in mouth throughout feeding, stimulation needed to elicit sucking reflex. Intervention(s): Adjust position;Assist with latch;Breast massage;Breast compression  Audible Swallowing: A few with stimulation Intervention(s): Hand expression;Alternate breast massage  Type of Nipple: Everted at rest and after stimulation (swollen areola causing nipple to invert when compressed) Intervention(s): Reverse pressure;Shells;Hand pump;Double electric pump  Comfort (Breast/Nipple): Engorged, cracked, bleeding, large blisters, severe discomfort Problem noted: Cracked, bleeding, blisters, bruises  Problem noted: Severe discomfort Interventions   (Cracked/bleeding/bruising/blister): Expressed breast milk to nipple;Reverse pressure;Lanolin;Hand pump;Double electric pump Interventions (Severe discomfort): Observe pumping;Double electric pum  Hold (Positioning): Assistance needed to correctly position infant at breast and maintain latch. Intervention(s): Breastfeeding basics reviewed;Support Pillows;Position options;Skin to skin  LATCH Score: 5  Lactation Tools Discussed/Used Tools: Shells;Nipple [place] Fuel;Lanolin;Pump;Comfort gels;Feeding cup Nipple shield size: 20 Shell Type: Sore Breast pump type: Double-Electric Breast Pump Pump Review: Setup, frequency, and cleaning;Milk Storage Initiated by:: Vivianne Master RN Date initiated:: 11/16/13   Consult Status Consult Status: Follow-up Date: 11/16/13 Follow-up type: In-patient    Vivianne Master [facility] 11/15/2013, 5:15 PM

## 2013-11-15 NOTE — Anesthesia Postprocedure Evaluation (Signed)
Anesthesia Post Note  Patient: Terri Wilson  Procedure(s) Performed: * No procedures listed *  Anesthesia type: Epidural  Patient location: Mother/Baby  Post pain: Pain level controlled  Post assessment: Post-op Vital signs reviewed  Last Vitals:  Filed Vitals:   11/15/13 0535  BP: 100/64  Pulse: 102  Temp: 36.9 C  Resp: 18    Post vital signs: Reviewed  Level of consciousness:alert  Complications: No apparent anesthesia complications

## 2013-11-15 NOTE — Progress Notes (Signed)

## 2013-11-16 MED ORDER — FERROUS SULFATE 325 (65 FE) MG PO TABS: 325.0000 mg | ORAL_TABLET | Freq: Two times a day (BID) | ORAL | Status: DC

## 2013-11-16 MED ORDER — IBUPROFEN 600 MG PO TABS: 600.0000 mg | ORAL_TABLET | Freq: Four times a day (QID) | ORAL | Status: DC

## 2013-11-16 NOTE — Anesthesia Postprocedure Evaluation (Signed)
  Anesthesia Post-op Note  Patient: Terri Wilson  Procedure(s) Performed: * No procedures listed *  Patient Location: Mother/Baby  Anesthesia Type:Epidural  Level of Consciousness: awake, alert , oriented and patient cooperative  Airway and Oxygen Therapy: Patient Spontanous Breathing  Post-op Pain: mild  Post-op Assessment: Post-op Vital signs reviewed, Patient's Cardiovascular Status Stable, Respiratory Function Stable, Patent Airway, No signs of Nausea or vomiting, Adequate PO intake, Pain level controlled and No headache  Post-op Vital Signs: Reviewed and stable  Last Vitals:  Filed Vitals:   11/16/13 0530  BP: 102/63  Pulse: 90  Temp: 37 C  Resp: 18    Complications: No apparent anesthesia complications

## 2013-11-16 NOTE — Discharge Summary (Signed)
Obstetric Discharge Summary Reason for Admission: rupture of membranes Prenatal Procedures: ultrasound, A1GDM, Depression Intrapartum Procedures: spontaneous vaginal delivery Postpartum Procedures: none Complications-Operative and Postpartum: first degree perineal laceration and sub-clitoral laceration, ABL anemia Hemoglobin  Date Value Ref Range Status  11/15/2013 9.5* 12.0 - 15.0 g/dL Final  08/26/2013 11.3* 12.2 - 16.2 g/dL Final     HCT  Date Value Ref Range Status  11/15/2013 29.3* 36.0 - 46.0 % Final     HCT, POC  Date Value Ref Range Status  08/26/2013 35.4* 37.7 - 47.9 % Final    Physical Exam:  General: alert and cooperative Lochia: appropriate Uterine Fundus: firm Incision: healing well, no significant drainage, no dehiscence, no significant erythema DVT Evaluation: No evidence of DVT seen on physical exam. Negative Homan's sign. No cords or calf tenderness. No significant calf/ankle edema.  Discharge Diagnoses: Term Pregnancy-delivered  Discharge Information: Date: 11/16/2013 Activity: pelvic rest Diet: routine Medications: PNV, Ibuprofen and Iron, Celexa Condition: stable Instructions: refer to practice specific booklet Discharge to: home Follow-up Information   Follow up with MODY,VAISHALI R, MD. Schedule an appointment as soon as possible for a visit in 6 weeks. (and 2 weeks for depression/med eval. Needs GTT 6-12 weeks postpartum.)    Specialty:  Obstetrics and Gynecology   Contact information:   [place] [address] [phone]       Newborn Data: Live born female on 11/14/13 Birth Weight: 8 lb 5.2 oz (3776 g) APGAR: 8, 9  Home with mother.  BHAMBRI, [place], N 11/16/2013, 1:29 PM

## 2013-11-16 NOTE — Addendum Note (Signed)
Addendum created 11/16/13 8288 by Brock Ra, CRNA   Modules edited: Charges VN, Notes Section   Notes Section:  File: [id number]

## 2013-11-16 NOTE — Progress Notes (Signed)
PPD #2- SVD  Subjective:   Reports feeling well Tolerating po/ No nausea or vomiting Bleeding is light Pain controlled with Motrin Up ad lib / ambulatory / voiding without problems Newborn: breastfeeding  / Circumcision: done   Objective:   VS: VS:  Filed Vitals:   11/14/13 1959 11/15/13 0535 11/15/13 1747 11/16/13 0530  BP: 106/81 100/64 114/79 102/63  Pulse: 91 102 83 90  Temp: 97.7 F (36.5 C) 98.4 F (36.9 C) 97.2 F (36.2 C) 98.6 F (37 C)  TempSrc: Oral Oral Oral Oral  Resp: 18 18 16 18   Height:      Weight:      SpO2:   98%     LABS:  Recent Labs  11/14/13 0215 11/15/13 0600  WBC 13.0* 17.2*  HGB 11.3* 9.5*  PLT 167 145*   Blood type: --/--/O POS (06/27 0215) Rubella: Immune (12/05 0000)                I&O: Intake/Output     06/28 0701 - 06/29 0700 06/29 0701 - 06/30 0700   Blood     Total Output       Net              Physical Exam: Alert and oriented X3 Abdomen: soft, non-tender, non-distended  Fundus: firm, non-tender, U-2 Perineum: Well approximated, no significant erythema, edema, or drainage; healing well. Lochia: small Extremities: No edema, no calf pain or tenderness    Assessment: PPD # 2 G1P1001/ S/P:spontaneous vaginal, 1st degree laceration, sub-clitoral laceration A1GDM, delivered Depression-stable Anemia Doing well - stable for discharge home   Plan: Discharge home RX's:  Ibuprofen 600mg  po Q 6 hrs prn pain #30 Refill x 0 Niferex 150mg  po BID #60 Refill x 1 Continue Celexa 20 mg daily Routine pp visit in 2 wks for PPD eval, and 6wks [facility] booklet given    Julianne Handler, N MSN, CNM 11/16/2013, 8:30 AM

## 2013-11-17 ENCOUNTER — Other Ambulatory Visit: Payer: Self-pay | Admitting: Obstetrics & Gynecology

## 2013-11-25 NOTE — Progress Notes (Signed)
Post discharge chart review completed.  

## 2013-11-26 ENCOUNTER — Inpatient Hospital Stay (HOSPITAL_COMMUNITY): Admission: RE | Admit: 2013-11-26 | Payer: 59 | Source: Ambulatory Visit

## 2014-01-27 ENCOUNTER — Ambulatory Visit (INDEPENDENT_AMBULATORY_CARE_PROVIDER_SITE_OTHER): Payer: 59 | Admitting: Family Medicine

## 2014-01-27 VITALS — BP 104/70 | HR 71 | Temp 97.5°F | Resp 18 | Ht 67.0 in | Wt 140.0 lb

## 2014-01-27 DIAGNOSIS — H8110 Benign paroxysmal vertigo, unspecified ear: Secondary | ICD-10-CM

## 2014-01-27 LAB — POCT CBC
Granulocyte percent: 42.1 %G (ref 37–80)
HCT, POC: 39.7 % (ref 37.7–47.9)
Hemoglobin: 13 g/dL (ref 12.2–16.2)
Lymph, poc: 2.6 (ref 0.6–3.4)
MCH, POC: 27.2 pg (ref 27–31.2)
MCHC: 32.8 g/dL (ref 31.8–35.4)
MCV: 82.7 fL (ref 80–97)
MID (cbc): 0.6 (ref 0–0.9)
MPV: 6.4 fL (ref 0–99.8)
POC Granulocyte: 2.4 (ref 2–6.9)
POC LYMPH PERCENT: 47.3 %L (ref 10–50)
POC MID %: 10.6 %M (ref 0–12)
Platelet Count, POC: 179 10*3/uL (ref 142–424)
RBC: 4.8 M/uL (ref 4.04–5.48)
RDW, POC: 16.7 %
WBC: 5.6 10*3/uL (ref 4.6–10.2)

## 2014-01-27 LAB — COMPREHENSIVE METABOLIC PANEL
ALT: 18 U/L (ref 0–35)
AST: 13 U/L (ref 0–37)
Albumin: 4.2 g/dL (ref 3.5–5.2)
Alkaline Phosphatase: 94 U/L (ref 39–117)
BUN: 11 mg/dL (ref 6–23)
CO2: 30 mEq/L (ref 19–32)
Calcium: 9.7 mg/dL (ref 8.4–10.5)
Chloride: 105 mEq/L (ref 96–112)
Creat: 0.67 mg/dL (ref 0.50–1.10)
Glucose, Bld: 83 mg/dL (ref 70–99)
Potassium: 4.3 mEq/L (ref 3.5–5.3)
Sodium: 141 mEq/L (ref 135–145)
Total Bilirubin: 0.3 mg/dL (ref 0.2–1.2)
Total Protein: 6.4 g/dL (ref 6.0–8.3)

## 2014-01-27 LAB — TSH: TSH: 2.34 u[IU]/mL (ref 0.350–4.500)

## 2014-01-27 LAB — GLUCOSE, POCT (MANUAL RESULT ENTRY): POC Glucose: 104 mg/dl — AB (ref 70–99)

## 2014-01-27 NOTE — Patient Instructions (Signed)
I think that you have BPPV- a very common type of vertigo.  This will likely pass over the next few days.  While you have symptoms please do not drive and be careful while carrying your son.  Getting out of bed and changing position slowly will also help.   If you are getting worse or have other symptoms please let me know!   I will be in touch with the rest of your labs.

## 2014-01-27 NOTE — Progress Notes (Signed)
[facility] [address], [place] [address] [phone]  Date:  01/27/2014   Name:  Terri Wilson   DOB:  [birth year]   MRN:  [id number]  PCP:  Marcille Blanco    Chief Complaint: Dizziness, Headache and Nausea   History of Present Illness:  Terri Wilson is a 28 y.o. very pleasant female patient who presents with the following:  She is here today with headache and vertigo.  During the night her infant son cried and she sat straight up in bed-this caused severe "spinnin"g vertigo.  It maybe lasted "a minute but seemed longer."   The severe spinning subsided, but returned when she got out of bed again, and then once again when she got up for good this morning. She also noted a HA yestesday- it had been severe but is now minimal.    She is supposed to come back to work on Monday following delivery of her son Terri Wilson 11 weeks ago.  Admits that she is tired, but Terri Wilson is sleeping fairly well now so this is getting better.      She has never had this in the past.  As long as she holds still she is ok- moving causes the sx No vomiting Her HA is nearly gone- this actually started prior to onset of vertigo.  Her vision is ok No tinnitus No numbness or weakness anywhere.   She has started exercising again over the last couple of days.  Wonders if she might be dehydrated.  She is nursing her son right now.    Patient Active Problem List   Diagnosis Date Noted  . Active labor at term 11/14/2013  . Postpartum care following vaginal delivery (6/27) 11/14/2013  . Cerebellar tonsillar ectopia 06/16/2013  . Migraine 04/25/2012  . AR (allergic rhinitis) 04/25/2012  . Anxiety and depression   . Panic attack     Past Medical History  Diagnosis Date  . Anxiety and depression   . Panic attack   . Herpes labialis   . Migraine   . Allergic rhinitis   . Chiari malformation     7 mm  . Depression   . Gestational diabetes mellitus, antepartum   . Gestational  diabetes     Past Surgical History  Procedure Laterality Date  . Tonsillectomy and adenoidectomy  2001  . Wisdom tooth extraction      History  Substance Use Topics  . Smoking status: Former Smoker -- 0.20 packs/day for 6 years    Types: Cigarettes  . Smokeless tobacco: Never Used     Comment: down from 1 PPD  . Alcohol Use: No    Family History  Problem Relation Age of Onset  . Asthma Other   . Cancer Other   . Diabetes Other   . Hyperlipidemia Other   . Hypertension Other   . Obesity Other   . Heart disease Other     Allergies  Allergen Reactions  . Lamictal [Lamotrigine] Anaphylaxis  . Vancomycin Other (See Comments)    Feels hot    Medication list has been reviewed and updated.  Current Outpatient Prescriptions on File Prior to Visit  Medication Sig Dispense Refill  . citalopram (CELEXA) 20 MG tablet Take 1 tablet (20 mg total) by mouth daily.  30 tablet  1  . Prenatal Vit-Fe Fumarate-FA (MULTIVITAMIN-PRENATAL) 27-0.8 MG TABS Take 1 tablet by mouth at bedtime.      . ferrous sulfate 325 (65 FE) MG  tablet Take 1 tablet (325 mg total) by mouth 2 (two) times daily with a meal.  60 tablet  1  . ibuprofen (ADVIL,MOTRIN) 600 MG tablet Take 1 tablet (600 mg total) by mouth every 6 (six) hours.  30 tablet  0   No current facility-administered medications on file prior to visit.    Review of Systems:  As per HPI- otherwise negative.   Physical Examination: Filed Vitals:   01/27/14 0843  BP: 104/70  Pulse: 71  Temp: 97.5 F (36.4 C)  Resp: 18   Filed Vitals:   01/27/14 0843  Height: 5\' 7"  (1.702 m)  Weight: 140 lb (63.504 kg)   Body mass index is 21.92 kg/(m^2). Ideal Body Weight: Weight in (lb) to have BMI = 25: 159.3  GEN: WDWN, NAD, Non-toxic, A & O x 3, looks well HEENT: Atraumatic, Normocephalic. Neck supple. No masses, No LAD.  Bilateral TM wnl, oropharynx normal.  PEERL,EOMI.   Ears and Nose: No external deformity. CV: RRR, No M/G/R. No JVD.  No thrill. No extra heart sounds. PULM: CTA B, no wheezes, crackles, rhonchi. No retractions. No resp. distress. No accessory muscle use. ABD: S, NT, ND, +BS. No rebound. No HSM. EXTR: No c/c/e NEURO Normal gait. Detailed neuro exam is normal including strength, sensation and DTR all extremites.  Normal motion and sensation of face.  Normal romberg.  She did feel dizzy again when I had her lie down and then sit back up [facility]: Normally interactive. Conversant. Not depressed or anxious appearing.  Calm demeanor.   Results for orders placed in visit on 01/27/14  POCT CBC      Result Value Ref Range   WBC 5.6  4.6 - 10.2 K/uL   Lymph, poc 2.6  0.6 - 3.4   POC LYMPH PERCENT 47.3  10 - 50 %L   MID (cbc) 0.6  0 - 0.9   POC MID % 10.6  0 - 12 %M   POC Granulocyte 2.4  2 - 6.9   Granulocyte percent 42.1  37 - 80 %G   RBC 4.80  4.04 - 5.48 M/uL   Hemoglobin 13.0  12.2 - 16.2 g/dL   HCT, POC 39.7  37.7 - 47.9 %   MCV 82.7  80 - 97 fL   MCH, POC 27.2  27 - 31.2 pg   MCHC 32.8  31.8 - 35.4 g/dL   RDW, POC 16.7     Platelet Count, POC 179  142 - 424 K/uL   MPV 6.4  0 - 99.8 fL  GLUCOSE, POCT (MANUAL RESULT ENTRY)      Result Value Ref Range   POC Glucose 104 (*) 70 - 99 mg/dl    Assessment and Plan: Benign paroxysmal positional vertigo, unspecified laterality - Plan: POCT CBC, POCT glucose (manual entry), Comprehensive metabolic panel, TSH  discussed in detail with Halana and her mother [name] who was also present.  She likely has BPPV.  Her sx are already a lot better than they were early this am.  Will use supportive measures, avoid medications as she is nursing. She will seek help right away if any change or worsening.  Otherwise suspect she will improve over the next few days  Signed Lamar Blinks, MD

## 2014-02-22 ENCOUNTER — Ambulatory Visit (INDEPENDENT_AMBULATORY_CARE_PROVIDER_SITE_OTHER): Payer: 59 | Admitting: Family Medicine

## 2014-02-22 VITALS — BP 112/70 | HR 86 | Temp 97.8°F | Resp 16 | Ht 68.0 in | Wt 146.6 lb

## 2014-02-22 DIAGNOSIS — R509 Fever, unspecified: Secondary | ICD-10-CM

## 2014-02-22 DIAGNOSIS — R059 Cough, unspecified: Secondary | ICD-10-CM

## 2014-02-22 DIAGNOSIS — J029 Acute pharyngitis, unspecified: Secondary | ICD-10-CM

## 2014-02-22 DIAGNOSIS — R05 Cough: Secondary | ICD-10-CM

## 2014-02-22 DIAGNOSIS — J069 Acute upper respiratory infection, unspecified: Secondary | ICD-10-CM

## 2014-02-22 LAB — POCT RAPID STREP A (OFFICE): Rapid Strep A Screen: NEGATIVE

## 2014-02-22 MED ORDER — AMOXICILLIN 500 MG PO CAPS: 500.0000 mg | ORAL_CAPSULE | Freq: Two times a day (BID) | ORAL | Status: DC

## 2014-02-22 NOTE — Patient Instructions (Signed)

## 2014-02-22 NOTE — Progress Notes (Signed)
Chief Complaint:  Chief Complaint  Patient presents with  . URI    x3 days coughing, runny nose, and hoarseness    HPI: Terri Wilson is a 28 y.o. female who is here for URI sxs including sore throat fever TM ax of 100.2 Thursday and Friday.  Took tylenol pm, has not had fever since Thursday and Friday, green and productive cough , dark green mucus and feels it is suffocating her.   She denies any asthma but has allergies and has tried otc mucinex. No facial pain or  Ear pain . No fevers since, She feels chest congestion. She is breast feeding. Cough is bothering her the most .   Past Medical History  Diagnosis Date  . Anxiety and depression   . Panic attack   . Herpes labialis   . Migraine   . Allergic rhinitis   . Chiari malformation     7 mm  . Depression   . Gestational diabetes mellitus, antepartum   . Gestational diabetes    Past Surgical History  Procedure Laterality Date  . Tonsillectomy and adenoidectomy  2001  . Wisdom tooth extraction     History   Social History  . Marital Status: Married    Spouse Name: [name]    Number of Children: 0  . Years of Education: 14   Occupational History  . 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 Smoking status: Former Smoker -- 0.20 packs/day for 6 years    Types: Cigarettes  . Smokeless tobacco: Never Used     Comment: down from 1 PPD  . Alcohol Use: No  . Drug Use: No  . Sexual Activity: Yes    Partners: Male   Other Topics Concern  . None   Social History Narrative   Lives with husband.   Caffeine Use: 40mg /day   Family History  Problem Relation Age of Onset  . Asthma Other   . Cancer Other   . Diabetes Other   . Hyperlipidemia Other   . Hypertension Other   . Obesity Other   . Heart disease Other    Allergies  Allergen Reactions  . Lamictal [Lamotrigine] Anaphylaxis  . Vancomycin Other (See Comments)    Feels hot   Prior to Admission medications     Medication Sig Start Date End Date Taking? Authorizing Provider  buPROPion (WELLBUTRIN) 75 MG tablet Take 75 mg by mouth daily.   Yes Historical Provider, MD  citalopram (CELEXA) 20 MG tablet Take 1 tablet (20 mg total) by mouth daily. 07/08/13  Yes Mancel Bale, PA-C  ferrous sulfate 325 (65 FE) MG tablet Take 1 tablet (325 mg total) by mouth 2 (two) times daily with a meal. 11/16/13  Yes Graciela Husbands, CNM  ibuprofen (ADVIL,MOTRIN) 600 MG tablet Take 1 tablet (600 mg total) by mouth every 6 (six) hours. 11/16/13  Yes Graciela Husbands, CNM  Prenatal Vit-Fe Fumarate-FA (MULTIVITAMIN-PRENATAL) 27-0.8 MG TABS Take 1 tablet by mouth at bedtime.   Yes Historical Provider, MD     ROS: The patient denies fevers, chills, night sweats, unintentional weight loss, chest pain, palpitations, wheezing, dyspnea on exertion, nausea, vomiting, abdominal pain, dysuria, hematuria, melena, numbness, weakness, or tingling.   All other systems have been reviewed and were otherwise negative with the exception of those mentioned in the HPI and as above.    PHYSICAL EXAM: Filed Vitals:   02/22/14 5284  BP: 112/70  Pulse: 86  Temp: 97.8 F (36.6 C)  Resp: 16   Filed Vitals:   02/22/14 0852  Height: 5\' 8"  (1.727 m)  Weight: 146 lb 9.6 oz (66.497 kg)   Body mass index is 22.3 kg/(m^2).  General: Alert, no acute distress HEENT:  Normocephalic, atraumatic, oropharynx patent. EOMI, PERRLA. Tm nl, no exudates, + little clear vesicles. +/-sinuses Cardiovascular:  Regular rate and rhythm, no rubs murmurs or gallops.  No Carotid bruits, radial pulse intact. No pedal edema.  Respiratory: Clear to auscultation bilaterally.  No wheezes, rales, or rhonchi.  No cyanosis, no use of accessory musculature GI: No organomegaly, abdomen is soft and non-tender, positive bowel sounds.  No masses. Skin: No rashes. Neurologic: Facial musculature symmetric. Psychiatric: Patient is appropriate throughout our  interaction. Lymphatic: No cervical lymphadenopathy Musculoskeletal: Gait intact.   LABS: Results for orders placed in visit on 02/22/14  POCT RAPID STREP A (OFFICE)      Result Value Ref Range   Rapid Strep A Screen Negative  Negative     EKG/XRAY:   Primary read interpreted by Dr. Marin Comment at [facility].   ASSESSMENT/PLAN: Encounter Diagnoses  Name Primary?  . Sore throat   . URI, acute Yes  . Cough   . Other specified fever    I think this more viral but did do a rapid strep due to sore throat and fever and cough, cx is pending Tried otc meds for cough and allergy sxs Gave her a list of cough and otc meds that is safe to use in pregnancy and breast feeding.  I prefer her to try these unless she feels terrible because she is breastfeeding and so far rapid strep was negative F/u prn   Gross sideeffects, risk and benefits, and alternatives of medications d/w patient. Patient is aware that all medications have potential sideeffects and we are unable to predict every sideeffect or drug-drug interaction that may occur.  Surabhi Gadea, [place], DO 02/22/2014 10:18 AM

## 2014-02-24 LAB — CULTURE, GROUP A STREP: Organism ID, Bacteria: NORMAL

## 2014-03-17 ENCOUNTER — Ambulatory Visit (INDEPENDENT_AMBULATORY_CARE_PROVIDER_SITE_OTHER): Payer: 59 | Admitting: Internal Medicine

## 2014-03-17 VITALS — BP 108/65 | HR 75 | Temp 98.3°F | Resp 16 | Ht 68.0 in | Wt 147.0 lb

## 2014-03-17 DIAGNOSIS — F32A Depression, unspecified: Secondary | ICD-10-CM

## 2014-03-17 DIAGNOSIS — F4323 Adjustment disorder with mixed anxiety and depressed mood: Secondary | ICD-10-CM

## 2014-03-17 DIAGNOSIS — F329 Major depressive disorder, single episode, unspecified: Secondary | ICD-10-CM

## 2014-03-17 DIAGNOSIS — F988 Other specified behavioral and emotional disorders with onset usually occurring in childhood and adolescence: Secondary | ICD-10-CM | POA: Insufficient documentation

## 2014-03-17 DIAGNOSIS — F909 Attention-deficit hyperactivity disorder, unspecified type: Secondary | ICD-10-CM

## 2014-03-17 MED ORDER — CITALOPRAM HYDROBROMIDE 20 MG PO TABS: 20.0000 mg | ORAL_TABLET | Freq: Every day | ORAL | Status: DC

## 2014-03-17 MED ORDER — BUPROPION HCL ER (XL) 300 MG PO TB24: 300.0000 mg | ORAL_TABLET | Freq: Every day | ORAL | Status: DC

## 2014-03-17 MED ORDER — AMPHETAMINE-DEXTROAMPHETAMINE 20 MG PO TABS: ORAL_TABLET | ORAL | Status: DC

## 2014-03-17 NOTE — Progress Notes (Signed)
   Subjective:    Patient ID: Terri Wilson, female    DOB: 11/26/1985, 28 y.o.   MRN: [id number]  HPIdelivered 4 mos ago--did well On celexa thruout pred for depr/anx present since teens--OB added wellbutr early postpartum for depr sxt. 150xl. Now back full time work and can't focus long enough to get job done. All nonbaby activities at home are not getting done. Very upset with self for her inability to get everything done. Husb supportive. Hx ADD thru out childhood and on meds at various times incl prepreg. Work stress incr with new respon-called in by supervis this week-?what's up with performance.  Fh-dad bipolar/left fam when she was in hs Past hx-s trauma hs  Review of Systems noncontr-healthy preg and deliv    Objective:   Physical Exam BP 108/65  Pulse 75  Temp(Src) 98.3 F (36.8 C)  Resp 16  Ht 5\' 8"  (1.727 m)  Wt 147 lb (66.679 kg)  BMI 22.36 kg/m2  SpO2 97% Perrla/eoms conj No thyromeg Ht reg Neuro intact Mood stable Affect appr Thought content wnl      Assessment & Plan:  Depression - Plan: citalopram (CELEXA) 20 MG tablet continued  ADD (attention deficit disorder)  Adjustment disorder with mixed anxiety and depressed mood  incr wellbutr to 300XL Add add at last effective dose 20mg --use tid for work and school coverage F/u 1 mo

## 2014-04-28 ENCOUNTER — Ambulatory Visit (INDEPENDENT_AMBULATORY_CARE_PROVIDER_SITE_OTHER): Payer: 59 | Admitting: Internal Medicine

## 2014-04-28 ENCOUNTER — Encounter: Payer: Self-pay | Admitting: Internal Medicine

## 2014-04-28 VITALS — BP 107/71 | HR 108 | Temp 98.8°F | Resp 18 | Ht 68.0 in | Wt 139.0 lb

## 2014-04-28 DIAGNOSIS — F909 Attention-deficit hyperactivity disorder, unspecified type: Secondary | ICD-10-CM

## 2014-04-28 DIAGNOSIS — F988 Other specified behavioral and emotional disorders with onset usually occurring in childhood and adolescence: Secondary | ICD-10-CM

## 2014-04-28 DIAGNOSIS — F329 Major depressive disorder, single episode, unspecified: Secondary | ICD-10-CM

## 2014-04-28 DIAGNOSIS — F32A Depression, unspecified: Secondary | ICD-10-CM

## 2014-04-28 DIAGNOSIS — F419 Anxiety disorder, unspecified: Secondary | ICD-10-CM

## 2014-04-28 DIAGNOSIS — F418 Other specified anxiety disorders: Secondary | ICD-10-CM

## 2014-04-28 MED ORDER — BUPROPION HCL ER (XL) 150 MG PO TB24: 300.0000 mg | ORAL_TABLET | Freq: Every day | ORAL | Status: DC

## 2014-04-28 MED ORDER — CITALOPRAM HYDROBROMIDE 20 MG PO TABS: 20.0000 mg | ORAL_TABLET | Freq: Every day | ORAL | Status: DC

## 2014-04-28 MED ORDER — LISDEXAMFETAMINE DIMESYLATE 50 MG PO CAPS: 50.0000 mg | ORAL_CAPSULE | Freq: Every day | ORAL | Status: DC

## 2014-04-29 NOTE — Progress Notes (Signed)
   Subjective:    Patient ID: Terri Wilson, female    DOB: Nov 13, 1985, 28 y.o.   MRN: [id number]  HPI follow-up  Anxiety and depression - Increase the Wellbutrin to 300 created feelings of hyperactivity and restlessness so she moved back down to her original dose 150 mg///her symptoms continue to be somewhat reactive and she does have significant stress as a new mother who works a full-time job and is moving into a new house  ADD (attention deficit disorder) -Resuming Adderall has been beneficial at work and at home although she does not require a third dose -There are no clear side effects to restarting this medication except for some irritability -A third dose does interfere with sleep  Postpartum issues-continues lactation despite not nursing/seems that hair is falling out///intermittent fatigue//no libido/has not resumed menses. Will follow up with Dr. Benjie Karvonen   Review of Systems No weight loss or night sweats    Objective:   Physical Exam BP 107/71 mmHg  Pulse 108  Temp(Src) 98.8 F (37.1 C)  Resp 18  Ht 5\' 8"  (1.727 m)  Wt 139 lb (63.05 kg)  BMI 21.14 kg/m2  SpO2 99% Scalp appears normal/hair loss areas not identified though she is shedding in a generalized way/not thin yet Rest of exam stable      Assessment & Plan:    Anxiety and depression--no change in meds for now  ADD (attention deficit disorder)--trial Vyvanse for a few days and call with results to plan for med refill  Hair loss and other issues---? Postpartum  Meds ordered this encounter  Medications  . lisdexamfetamine (VYVANSE) 50 MG capsule    Sig: Take 1 capsule (50 mg total) by mouth daily.    Dispense:  3 capsule    Refill:  0  . citalopram (CELEXA) 20 MG tablet    Sig: Take 1 tablet (20 mg total) by mouth daily.    Dispense:  90 tablet    Refill:  1  . buPROPion (WELLBUTRIN XL) 150 MG 24 hr tablet    Sig: Take 2 tablets (300 mg total) by mouth daily.    Dispense:  90 tablet    Refill:   1

## 2014-05-12 ENCOUNTER — Ambulatory Visit (INDEPENDENT_AMBULATORY_CARE_PROVIDER_SITE_OTHER): Payer: 59 | Admitting: Internal Medicine

## 2014-05-12 ENCOUNTER — Telehealth: Payer: Self-pay | Admitting: Physician Assistant

## 2014-05-12 ENCOUNTER — Encounter: Payer: Self-pay | Admitting: Internal Medicine

## 2014-05-12 VITALS — BP 108/78 | HR 86 | Temp 98.1°F | Resp 16 | Ht 67.5 in | Wt 137.8 lb

## 2014-05-12 DIAGNOSIS — R059 Cough, unspecified: Secondary | ICD-10-CM

## 2014-05-12 DIAGNOSIS — H6692 Otitis media, unspecified, left ear: Secondary | ICD-10-CM

## 2014-05-12 DIAGNOSIS — F909 Attention-deficit hyperactivity disorder, unspecified type: Secondary | ICD-10-CM

## 2014-05-12 DIAGNOSIS — R05 Cough: Secondary | ICD-10-CM

## 2014-05-12 DIAGNOSIS — F988 Other specified behavioral and emotional disorders with onset usually occurring in childhood and adolescence: Secondary | ICD-10-CM

## 2014-05-12 DIAGNOSIS — H9192 Unspecified hearing loss, left ear: Secondary | ICD-10-CM

## 2014-05-12 DIAGNOSIS — J069 Acute upper respiratory infection, unspecified: Secondary | ICD-10-CM

## 2014-05-12 MED ORDER — AMPHETAMINE-DEXTROAMPHETAMINE 20 MG PO TABS: ORAL_TABLET | ORAL | Status: DC

## 2014-05-12 MED ORDER — HYDROCODONE-HOMATROPINE 5-1.5 MG/5ML PO SYRP: 5.0000 mL | ORAL_SOLUTION | Freq: Four times a day (QID) | ORAL | Status: DC | PRN

## 2014-05-12 MED ORDER — PROMETHAZINE-CODEINE 6.25-10 MG/5ML PO SYRP: 5.0000 mL | ORAL_SOLUTION | Freq: Four times a day (QID) | ORAL | Status: DC | PRN

## 2014-05-12 MED ORDER — AMOXICILLIN 875 MG PO TABS: 875.0000 mg | ORAL_TABLET | Freq: Two times a day (BID) | ORAL | Status: DC

## 2014-05-12 NOTE — Telephone Encounter (Signed)
Spoke with Dr. Laney Pastor today and he prescribed a cough syrup. She didn't realize that it contained hydrocodone until after she took it and developed nausea. Requests something else, stronger than tessalon perles. Has tolerated products with codeine in the past.  Meds ordered this encounter  Medications  . promethazine-codeine (PHENERGAN WITH CODEINE) 6.25-10 MG/5ML syrup    Sig: Take 5 mLs by mouth every 6 (six) hours as needed for cough.    Dispense:  120 mL    Refill:  0    Order Specific Question:  Supervising Provider    Answer:  DOOLITTLE, ROBERT P [[id number]]   Called to pharmacy.

## 2014-05-13 NOTE — Progress Notes (Signed)
   Subjective:    Patient ID: Terri Wilson, female    DOB: Nov 03, 1985, 28 y.o.   MRN: [id number]  HPI Complaining of acute onset of congestion with postnasal drainage that is purulent, pressure and pain in the left ear, fluid draining from the left ear after a pop last night, decreased hearing today, and chest condition over the last 24 hours. She had chills and fever each of the last 2 days. She feels terrible No sore throat Cough nonproductive  She has been unable to try Vyvanse--see last note--will do so soon   Review of Systems Stable///lactation diminished///short period last week    Objective:   Physical Exam BP 108/78 mmHg  Pulse 86  Temp(Src) 98.1 F (36.7 C) (Oral)  Resp 16  Ht 5' 7.5" (1.715 m)  Wt 137 lb 12.8 oz (62.506 kg)  BMI 21.25 kg/m2  SpO2 98%  LMP 05/04/2014  Breastfeeding? No Left tympanic membrane is dull and bulging but no clear perforation Diminished hearing on the left compared to the right Right TM clear Nares boggy Throat clear/no cervical adenopathy  chest clear to auscultation        Assessment & Plan:  Left otitis media--history of perforation--decreased hearing  ADD (attention deficit disorder)--trial Vyvanse after the holidays Continue Adderall for now   Meds ordered this encounter  Medications  . amoxicillin (AMOXIL) 875 MG tablet    Sig: Take 1 tablet (875 mg total) by mouth 2 (two) times daily.    Dispense:  20 tablet    Refill:  0  .  HYDROcodone-homatropine (HYCODAN) 5-1.5 MG/5ML syrup    Sig: Take 5 mLs by mouth every 6 (six) hours as needed.    Dispense:  120 mL    Refill:  0  . amphetamine-dextroamphetamine (ADDERALL) 20 MG tablet    Sig: 1 at 7am, noon, and 5pm    Dispense:  90 tablet    Refill:  0   Recheck hearing in 1-2 weeks

## 2014-07-20 ENCOUNTER — Ambulatory Visit (INDEPENDENT_AMBULATORY_CARE_PROVIDER_SITE_OTHER): Payer: 59 | Admitting: Internal Medicine

## 2014-07-20 VITALS — BP 104/70 | HR 109 | Temp 98.6°F | Resp 18 | Ht 67.75 in | Wt 136.0 lb

## 2014-07-20 DIAGNOSIS — R5383 Other fatigue: Secondary | ICD-10-CM

## 2014-07-20 DIAGNOSIS — R200 Anesthesia of skin: Secondary | ICD-10-CM

## 2014-07-20 DIAGNOSIS — R208 Other disturbances of skin sensation: Secondary | ICD-10-CM

## 2014-07-20 DIAGNOSIS — R259 Unspecified abnormal involuntary movements: Secondary | ICD-10-CM

## 2014-07-20 DIAGNOSIS — G259 Extrapyramidal and movement disorder, unspecified: Secondary | ICD-10-CM

## 2014-07-20 DIAGNOSIS — IMO0002 Reserved for concepts with insufficient information to code with codable children: Secondary | ICD-10-CM

## 2014-07-20 DIAGNOSIS — R29898 Other symptoms and signs involving the musculoskeletal system: Secondary | ICD-10-CM

## 2014-07-20 LAB — POCT CBC
Granulocyte percent: 56 %G (ref 37–80)
HCT, POC: 41.3 % (ref 37.7–47.9)
Hemoglobin: 13.7 g/dL (ref 12.2–16.2)
Lymph, poc: 2.9 (ref 0.6–3.4)
MCH, POC: 29 pg (ref 27–31.2)
MCHC: 33.2 g/dL (ref 31.8–35.4)
MCV: 87.5 fL (ref 80–97)
MID (cbc): 0.2 (ref 0–0.9)
MPV: 6.8 fL (ref 0–99.8)
POC Granulocyte: 4 (ref 2–6.9)
POC LYMPH PERCENT: 41.1 %L (ref 10–50)
POC MID %: 2.9 %M (ref 0–12)
Platelet Count, POC: 234 10*3/uL (ref 142–424)
RBC: 4.72 M/uL (ref 4.04–5.48)
RDW, POC: 12.7 %
WBC: 7.1 10*3/uL (ref 4.6–10.2)

## 2014-07-20 LAB — POCT URINE PREGNANCY: Preg Test, Ur: NEGATIVE

## 2014-07-20 LAB — POCT SEDIMENTATION RATE: POCT SED RATE: 6 mm/hr (ref 0–22)

## 2014-07-20 NOTE — Progress Notes (Signed)
This chart was scribed for Tami Lin, MD by Einar Pheasant, ED Scribe. This patient was seen in room 5 and the patient's care was started at 4:44 PM.  Subjective:    Patient ID: Terri Wilson, female    DOB: 02-07-1986, 29 y.o.   MRN: [id number]  Chief Complaint  Patient presents with  . Numbness    Interminent for 3 months, in both legs and feet    HPI Terri Wilson is a 29 y.o. female with PMhx of ADD, anxiety, depression-including recent postpartum depression, and migraine.  Today, pt is here in the office with a chief complaint of 3 month intermittent numbness to bilateral legs and feet, as well as multiple complaints. Pt states that there is a lot that has been going on at home and because of it she did not have time to come in when the symptoms first presented. Today, she comes in with a note which she asked me to read before our consult. Note read. Symptoms outlined.  She states that 3 months ago she was at the store, christmas shopping when her right leg went completely numb. She state that the top of her foot and and ankle were not working properly so she noted that she had been dragging her foot all the way to the car. Immediately she became concerned that her symptoms were being brought on by a TIA, but dismissed the notion. over the next couple of days her, symptoms of numbness persisted. Initially, the numbing sensation was only to her right leg. It went away after about an hour but has returned intermittently ever since. However, now that sensation is affecting both legs and feet. The left leg is transient and does not affect gait. The right leg is now persistent over the last week and she feels like it alters her gait all the time as well as feeling decreased sensation and strength all the time.  Pt also complaining of lower back pain. She states that she has been having intermittent back pain since high school, but she wants to attribute the exacerbation to her  recent delivery. Pt describes the discomfort as "not painful in nature" but "spasm like" that radiates down her legs. Occasional. Does not affect activity.  Another complaint pt has, is bilateral upper extremity weakness. She states that 6 weeks ago she was in the kitchen making some coffee. However, when she went to grab the mug she dropped the cup. She states that she has noted that she does not have much strength in her hand. Pt states that her symptoms become worse towards the afternoon. She states that her symptoms feel like a fatigue that you feel after a work out. "Pt feels like her mind is delaying what she wants to happen with her body." Has not affected handwriting yet.  She states that since her symptoms started she has taken the precautions to get better. Pt reports sleeping at least 8 hours every nigh, taking multivitamins, and staying hydrated; but her symptoms have not resolved.   Pt is concerned that her symptoms may be due to medications. However, something that she recalls is that she was seen in the office by another colleague complaining of a numbing itchy rash to her upper left back, around shoulder region. Pt states that she was treated for it, but it has returned. She now describes it as "burning" in nature with associated pruritis. However, she states that it is not relieved by itching. Not painful. This was  an area along her scapula on the left and it was biopsied because of the funny each. The final diagnosis was neuralgia paresthetica and this is been intermittent ever since.  At this moment she denies any fever, neck pain, sore throat, visual disturbance, CP, cough, SOB, abdominal pain, nausea, emesis, diarrhea, urinary symptoms, back pain, HA, weakness, or rash as associated symptoms.  On birth control. She is also on Wellbutrin, Celexa, and Adderall.  Pt denies missing any dose of Adderall, which she takes 1.5 in the morning and 1.5 in the evening. She states that her mood has  been pretty stable-anxiety and depression both better with the exception of her random outbursts of anger a few weeks back, which she states concerned her mother a lot. Reminiscent of high school.  Patient Active Problem List   Diagnosis Date Noted  . ADD (attention deficit disorder) 03/17/2014  . Adjustment disorder with mixed anxiety and depressed mood 03/17/2014  . Active labor at term 11/14/2013  . Postpartum care following vaginal delivery (6/27) 11/14/2013  . Cerebellar tonsillar ectopia 06/16/2013  . Migraine 04/25/2012  . AR (allergic rhinitis) 04/25/2012  . Anxiety and depression   . Panic attack    Past Medical History  Diagnosis Date  . Anxiety and depression   . Panic attack   . Herpes labialis   . Migraine   . Allergic rhinitis   . Chiari malformation     7 mm  . Depression   . Gestational diabetes mellitus, antepartum   . Gestational diabetes    Past Surgical History  Procedure Laterality Date  . Tonsillectomy and adenoidectomy  2001  . Wisdom tooth extraction     Allergies  Allergen Reactions  . Lamictal [Lamotrigine] Anaphylaxis  . Hydrocodone Nausea Only    Tolerates codeine  . Vancomycin Other (See Comments)    Feels hot   Prior to Admission medications   Medication Sig Start Date End Date Taking? Authorizing Provider  amphetamine-dextroamphetamine (ADDERALL) 20 MG tablet 1 at 7am, noon, and 5pm 05/12/14  Yes Leandrew Koyanagi, MD  buPROPion (WELLBUTRIN XL) 150 MG 24 hr tablet Take 2 tablets (300 mg total) by mouth daily. 04/28/14  Yes Leandrew Koyanagi, MD  citalopram (CELEXA) 20 MG tablet Take 1 tablet (20 mg total) by mouth daily. 04/28/14  Yes Leandrew Koyanagi, MD  ibuprofen (ADVIL,MOTRIN) 600 MG tablet Take 1 tablet (600 mg total) by mouth every 6 (six) hours. Patient not taking: Reported on 07/20/2014 11/16/13   Graciela Husbands, CNM  lisdexamfetamine (VYVANSE) 50 MG capsule Take 1 capsule (50 mg total) by mouth daily. Patient not taking:  Reported on 05/12/2014 04/28/14   Leandrew Koyanagi, MD   History   Social History  . Marital Status: Married    Spouse Name: [name]  . Number of Children: 0  . Years of Education: 14   Occupational History  . 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 Smoking status: Former Smoker -- 0.20 packs/day for 6 years    Types: Cigarettes  . Smokeless tobacco: Never Used     Comment: down from 1 PPD  . Alcohol Use: No  . Drug Use: No  . Sexual Activity:    Partners: Male   Other Topics Concern  . Not on file   Social History Narrative   Lives with husband.   Caffeine Use: 40mg /day     Review of Systems  Constitutional: Positive for fatigue.  Negative for fever and unexpected weight change.  HENT: Negative for congestion and trouble swallowing.        No blindness or loss of vision although she has occasional blurred vision No hoarseness  Eyes: Negative for visual disturbance.  Respiratory: Negative for cough and shortness of breath.   Cardiovascular: Negative for chest pain, palpitations and leg swelling.  Gastrointestinal: Negative for nausea, vomiting and abdominal pain.  Genitourinary: Negative for difficulty urinating and menstrual problem.  Musculoskeletal: Positive for back pain and gait problem. Negative for myalgias, joint swelling, arthralgias, neck pain and neck stiffness.  Skin: Positive for rash.  Neurological: Positive for weakness and numbness. Negative for dizziness and headaches.  Psychiatric/Behavioral: Positive for decreased concentration. Negative for hallucinations and sleep disturbance. The patient is not nervous/anxious.    Objective:   Physical Exam  Constitutional: She is oriented to person, place, and time. She appears well-developed and well-nourished. No distress.  HENT:  Head: Normocephalic and atraumatic.  Right Ear: External ear normal.  Left Ear: External ear normal.  Nose: Nose normal.  Mouth/Throat:  Oropharynx is clear and moist.  Eyes: Conjunctivae and EOM are normal. Pupils are equal, round, and reactive to light. Right eye exhibits no discharge. Left eye exhibits no discharge.  Neck: Normal range of motion. Neck supple. No thyromegaly present.  No shocklike symptoms with flexion or extension of the neck  Cardiovascular: Normal rate, regular rhythm, normal heart sounds and intact distal pulses.  Exam reveals no gallop and no friction rub.   No murmur heard. Pulmonary/Chest: Effort normal and breath sounds normal. No respiratory distress.  Abdominal: Soft. Bowel sounds are normal. She exhibits no distension. There is no tenderness.  Musculoskeletal: Normal range of motion. She exhibits no edema or tenderness.  Neck has a full range of motion without pain Both shoulders with full range of motion The spine reveals posture to be bowed to the right but able to straight spine with encouragement. She has full twisting and flexion maneuvers without discomfort. The back is not tender to palpation.  Lymphadenopathy:    She has no cervical adenopathy.  Neurological: She is alert and oriented to person, place, and time. No cranial nerve deficit. She exhibits abnormal muscle tone (In the lower extremities and to some extent in the right hand). She displays no Babinski's sign on the right side. She displays no Babinski's sign on the left side.  Reflex Scores:      Tricep reflexes are 1+ on the right side and 1+ on the left side.      Bicep reflexes are 2+ on the right side and 2+ on the left side.      Brachioradialis reflexes are 1+ on the right side and 1+ on the left side.      Patellar reflexes are 2+ on the right side and 2+ on the left side.      Achilles reflexes are 1+ on the right side and 1+ on the left side. Extraocular movements conjugate. Finger to nose normal Rapid alternating movements normal. Finger-thumb opposition a little weak on the right thumb to index. Right hand grip 4 out of 5  with left hand grip 5 out of 5. Hoffmans negative. Brachial radialis reflexes symmetrical. Radial reflex symmetrical. No sensory losses in the hands. The lower extremities reveal a course tremor with activity in both legs. Her attempts to do a straight leg raise produce this tremor bilaterally. She can stand on the right foot but not on the left as  this produces a tremor that affects balance. Ankle reflexes do not produce sustained clonus but for me to hold her feet before performing ankle maneuver she has a coarse tremor present bilaterally. Straight leg raise does not produce any radicular symptoms. The hips have good range of motion without creating sustained tremor. Sensation is intact over both feet. shin punch and toe up are weak bilaterally with some coarse tremor as she attempts this. Patella reflexes symmetrical without clonus. She is able to have a fairly normal gait and Romberg is stable.  Skin: Skin is warm and dry.  Psychiatric: Her speech is normal and behavior is normal. Thought content normal. Thought content is not delusional. Cognition and memory are not impaired. She exhibits normal recent memory and normal remote memory.  Mood is mildly apprehensive but appropriate affect.  Nursing note and vitals reviewed.  BP 104/70 mmHg  Pulse 109  Temp(Src) 98.6 F (37 C) (Oral)  Resp 18  Ht 5' 7.75" (1.721 m)  Wt 136 lb (61.689 kg)  BMI 20.83 kg/m2  SpO2 99%  LMP 07/02/2014   Assessment & Plan:  Problem #1 alteration in gait due to weakness with intermittent numbness in the right lower extremity  problem #2 action tremor on examination in lower extremities only problem #3 weakness in the right hand arm Problem #4 recent fatigue Problem #5 on medications for depression and anxiety and ADD  Differential diagnosis would have [place] consider central disorders such as multiple sclerosis, cervical myelopathy, metabolic disorders including hyperthyroidism, medication effects from any one of  her medications. She has a history of Chiari malformation--7 mm by MRI 2011. Shallow disc centrally C4-5 on MRI June 2014.  She will stop Wellbutrin. We will proceed with labs and MRI of the brain   I have completed the patient encounter in its entirety as documented by the scribe, with editing by me where necessary. Jefferey Lippmann P. Laney Pastor, M.D.

## 2014-07-21 ENCOUNTER — Ambulatory Visit: Payer: 59 | Admitting: Internal Medicine

## 2014-07-21 ENCOUNTER — Telehealth: Payer: Self-pay | Admitting: Family Medicine

## 2014-07-21 LAB — COMPLETE METABOLIC PANEL WITH GFR
ALT: 9 U/L (ref 0–35)
AST: 12 U/L (ref 0–37)
Albumin: 4.4 g/dL (ref 3.5–5.2)
Alkaline Phosphatase: 80 U/L (ref 39–117)
BUN: 7 mg/dL (ref 6–23)
CO2: 27 mEq/L (ref 19–32)
Calcium: 9.4 mg/dL (ref 8.4–10.5)
Chloride: 104 mEq/L (ref 96–112)
Creat: 0.72 mg/dL (ref 0.50–1.10)
GFR, Est African American: >89 mL/min
GFR, Est Non African American: >89 mL/min
Glucose, Bld: 101 mg/dL — ABNORMAL HIGH (ref 70–99)
Potassium: 4.2 mEq/L (ref 3.5–5.3)
Sodium: 138 mEq/L (ref 135–145)
Total Bilirubin: 0.3 mg/dL (ref 0.2–1.2)
Total Protein: 7 g/dL (ref 6.0–8.3)

## 2014-07-21 LAB — MAGNESIUM: Magnesium: 1.8 mg/dL (ref 1.5–2.5)

## 2014-07-21 LAB — TSH: TSH: 1.971 u[IU]/mL (ref 0.350–4.500)

## 2014-07-21 LAB — VITAMIN B12: Vitamin B-12: 616 pg/mL (ref 211–911)

## 2014-07-21 NOTE — Telephone Encounter (Signed)
Patient wanted to know if labs was back i told her it might be back om 07-21-12

## 2014-07-22 ENCOUNTER — Telehealth: Payer: Self-pay

## 2014-07-23 ENCOUNTER — Encounter: Payer: Self-pay | Admitting: Internal Medicine

## 2014-07-23 ENCOUNTER — Telehealth: Payer: Self-pay | Admitting: *Deleted

## 2014-07-23 ENCOUNTER — Ambulatory Visit (INDEPENDENT_AMBULATORY_CARE_PROVIDER_SITE_OTHER): Payer: 59

## 2014-07-23 ENCOUNTER — Ambulatory Visit (INDEPENDENT_AMBULATORY_CARE_PROVIDER_SITE_OTHER): Payer: 59 | Admitting: Internal Medicine

## 2014-07-23 VITALS — BP 100/60 | HR 120 | Temp 97.9°F | Resp 16 | Ht 67.5 in | Wt 134.0 lb

## 2014-07-23 DIAGNOSIS — M546 Pain in thoracic spine: Secondary | ICD-10-CM | POA: Diagnosis not present

## 2014-07-23 DIAGNOSIS — F419 Anxiety disorder, unspecified: Secondary | ICD-10-CM

## 2014-07-23 DIAGNOSIS — H8110 Benign paroxysmal vertigo, unspecified ear: Secondary | ICD-10-CM | POA: Diagnosis not present

## 2014-07-23 DIAGNOSIS — R259 Unspecified abnormal involuntary movements: Secondary | ICD-10-CM

## 2014-07-23 DIAGNOSIS — F32A Depression, unspecified: Secondary | ICD-10-CM

## 2014-07-23 DIAGNOSIS — R5383 Other fatigue: Secondary | ICD-10-CM | POA: Diagnosis not present

## 2014-07-23 DIAGNOSIS — IMO0002 Reserved for concepts with insufficient information to code with codable children: Secondary | ICD-10-CM

## 2014-07-23 DIAGNOSIS — M545 Low back pain, unspecified: Secondary | ICD-10-CM

## 2014-07-23 DIAGNOSIS — G259 Extrapyramidal and movement disorder, unspecified: Secondary | ICD-10-CM

## 2014-07-23 DIAGNOSIS — F329 Major depressive disorder, single episode, unspecified: Secondary | ICD-10-CM

## 2014-07-23 MED ORDER — CLONAZEPAM 0.5 MG PO TABS: 0.5000 mg | ORAL_TABLET | Freq: Three times a day (TID) | ORAL | Status: DC | PRN

## 2014-07-23 NOTE — Progress Notes (Signed)
Subjective:    Patient ID: Terri Wilson, female    DOB: [birth year], 29 y.o.   MRN: [id number] This chart was scribed for Tami Lin, MD by Steva Colder, ED Scribe. The patient was seen in room 21 at 11:40 AM.   Chief Complaint  Patient presents with  . Contractions    per patient of Muscles - THORARIC BACK, CHEST, STOMACH and WEAKNESS    HPI   HPI Comments: Terri Wilson is a 29 y.o. female who presents to the Emergency Department complaining of contractions onset 2 days. The contractions occur in her thoracic back and sweep into the front of her chest and stomach. Pt has had similar symptoms that she went to sleep and woke up with.   Yesterday the symptoms came back stronger and the pt was having trouble breathing. The episode yesterday lasted for awhile while the pt was out with her son. Pt notes that yesterday when she woke up after a nap for a hour the symptoms were gone.  Last night the symptoms came back worse than before and the pt stretched and took a warm bath with no relief. The pt was able to eat last night. Pt finds relief when she lays down. Last night the pt began to experience vertigo like symptoms.  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 Pt has not had these symptoms happen before. Pt doesn't notice the symptoms when she goes from a laying to sitting position. Pt notes that this morning, the pt feels as if her arms are weak, there is no weakness in her legs.   She states that she is having associated symptoms of lightheaded, dizziness, fatigue, nausea, weakness.  Pt is no longer taking Wellbutrin because it was d/c. Pt took 4 200 mg Ibuprofen and the symptoms still didn't go away after one hour. Pt is typically aided with ibuprofen. Pt also took klonopin that made her go to sleep.  She denies vomiting and any other symptoms. Pt smokes 0.5-1 PPD a day that she has just started back after pregnancy. Pt has had episodes of not remembering things  that she has contributed to life factors. When the pt was pregnant she had received two epidurals from two different anesthesiologist and they both failed.  Patient Active Problem List   Diagnosis Date Noted  . ADD (attention deficit disorder) 03/17/2014  . Adjustment disorder with mixed anxiety and depressed mood 03/17/2014  . Active labor at term 11/14/2013  . Postpartum care following vaginal delivery (6/27) 11/14/2013  . Cerebellar tonsillar ectopia 06/16/2013  . Migraine 04/25/2012  . AR (allergic rhinitis) 04/25/2012  . Anxiety and depression   . Panic attack    Past Medical History  Diagnosis Date  . Anxiety and depression   . Panic attack   . Herpes labialis   . Migraine   . Allergic rhinitis   . Chiari malformation     7 mm  . Depression   . Gestational diabetes mellitus, antepartum   . Gestational diabetes    Past Surgical History  Procedure Laterality Date  . Tonsillectomy and adenoidectomy  2001  . Wisdom tooth extraction     Allergies  Allergen Reactions  . Lamictal [Lamotrigine] Anaphylaxis  . Hydrocodone Nausea Only    Tolerates codeine  . Vancomycin Other (See Comments)    Feels hot   Prior to Admission medications   Medication Sig Start Date End Date Taking? Authorizing Provider  amphetamine-dextroamphetamine (ADDERALL) 20 MG tablet  1 at 7am, noon, and 5pm 05/12/14   Leandrew Koyanagi, MD  buPROPion (WELLBUTRIN XL) 150 MG 24 hr tablet Take 2 tablets (300 mg total) by mouth daily. 04/28/14   Leandrew Koyanagi, MD  citalopram (CELEXA) 20 MG tablet Take 1 tablet (20 mg total) by mouth daily. 04/28/14   Leandrew Koyanagi, MD  ibuprofen (ADVIL,MOTRIN) 600 MG tablet Take 1 tablet (600 mg total) by mouth every 6 (six) hours. Patient not taking: Reported on 07/20/2014 11/16/13   Graciela Husbands, CNM  lisdexamfetamine (VYVANSE) 50 MG capsule Take 1 capsule (50 mg total) by mouth daily. Patient not taking: Reported on 05/12/2014 04/28/14   Leandrew Koyanagi, MD        Review of Systems  Constitutional: Positive for fatigue.  Gastrointestinal: Positive for nausea. Negative for vomiting.  Neurological: Positive for dizziness, weakness and light-headedness.       Objective:   Physical Exam  Constitutional: She is oriented to person, place, and time. She appears well-developed and well-nourished. No distress.  Very apprehensive.   HENT:  Head: Normocephalic and atraumatic.  Eyes: Conjunctivae and EOM are normal. Pupils are equal, round, and reactive to light.  Neck: Neck supple. No tracheal deviation present. No thyromegaly present.  Cardiovascular: Regular rhythm and normal heart sounds.  Tachycardia present.   No murmur heard. Tachy at 100 but slows to 70 with Valsalva   Pulmonary/Chest: Effort normal and breath sounds normal. No respiratory distress. She has no wheezes. She has no rales.  Abdominal: Soft. She exhibits no distension and no mass. There is no splenomegaly or hepatomegaly. There is no tenderness.  Musculoskeletal: Normal range of motion.  Non tender to palpation over paraspinal muscles.  No edema.  Neurological: She is alert and oriented to person, place, and time. She displays no Babinski's sign on the right side. She displays no Babinski's sign on the left side.  Reflex Scores:      Bicep reflexes are 3+ on the right side and 3+ on the left side.      Brachioradialis reflexes are 1+ on the right side and 1+ on the left side.      Patellar reflexes are 3+ on the right side and 3+ on the left side.      Achilles reflexes are 2+ on the right side and 2+ on the left side. Cranial nerves 2-12 intact. Irregular Athetoid movements of the right leg during SLR and  Occasionally at rest with knee flexed. This finding was also present on the left but very brief. Clonus can not be elicited.   Skin: Skin is warm and dry.  Psychiatric: She has a normal mood and affect. Her behavior is normal.  Nursing note and vitals reviewed.  [facility]  reading (PRIMARY) by  Dr. Bella Brummet= lumbar spine appears normal as does thoracic spine with the exception of a mild thoraco lumbar scoliosis.    BP 100/60 mmHg  Pulse 120  Temp(Src) 97.9 F (36.6 C) (Oral)  Resp 16  Ht 5' 7.5" (1.715 m)  Wt 134 lb (60.782 kg)  BMI 20.67 kg/m2  SpO2 98%  LMP 07/02/2014  Assessment & Plan:  DIAGNOSTIC STUDIES: Oxygen Saturation is 98% on room air, normal by my interpretation.    COORDINATION OF CARE: 12:18 PM-Discussed treatment plan which includes MRI brain, d/c Adderall for the weekend, muscle relaxer, 0.5 mg Klonopin Rx, X-ray of T-Spine and L-spine with pt at bedside and pt agreed to plan.    I have completed  the patient encounter in its entirety as documented by the scribe, with editing by me where necessary. Shequilla Goodgame P. Laney Pastor, M.D.  Bilateral thoracic back pain -  Bilateral low back pain without sciatica   Normal views are helpful//   Tremor, involuntary spasm, or fasciculation  The changing pattern though still focused on the lower extremities could still be consistent with a demyelinating  disorder and we are awaiting MRI of the brain  Other fatigue  Labs were normal/she is making lifestyle changes  Benign paroxysmal positional vertigo, unspecified laterality  1 brief episode without neurological findings on exam--she had had this in the past briefly  Anxiety  Certainly anxious about her predicament and the addition of Klonopin 0.5 mg 3 times a day may be helpful  She will discontinue Adderall over the weekend to see the difference in her symptoms/she is off Wellbutrin now

## 2014-07-23 NOTE — Telephone Encounter (Signed)
Dr. Laney Pastor,  Patient wants you to know that she has had 3 episodes of intense back, side and abdomen/chest muscle contractions over the past 2 days that has lasted a couple to a few hours each time.  The contractions were very uncomfortable and painful, with over the counter medication not relieving the discomfort.  She states that breathing was difficult, she is also having moderate RT arm weakness this morning.  Please let her know if she needs to be seen or just wait it out.  Ext # [phone]  Thank you,

## 2014-07-23 NOTE — Telephone Encounter (Signed)
I'll be glad to check her out down at 104 whenever is convenient for her work schedule. They can triage her and call and let me know she's ready

## 2014-07-23 NOTE — Telephone Encounter (Signed)
Pt.notified

## 2014-07-27 ENCOUNTER — Ambulatory Visit (HOSPITAL_COMMUNITY): Admission: RE | Admit: 2014-07-27 | Payer: 59 | Source: Ambulatory Visit

## 2014-07-27 ENCOUNTER — Other Ambulatory Visit: Payer: Self-pay | Admitting: Emergency Medicine

## 2014-07-27 ENCOUNTER — Other Ambulatory Visit: Payer: 59

## 2014-07-27 ENCOUNTER — Ambulatory Visit (HOSPITAL_COMMUNITY)
Admission: RE | Admit: 2014-07-27 | Discharge: 2014-07-27 | Disposition: A | Discharge status: Home / Self Care | Payer: 59 | Source: Ambulatory Visit | Attending: Emergency Medicine | Admitting: Emergency Medicine

## 2014-07-27 DIAGNOSIS — R202 Paresthesia of skin: Secondary | ICD-10-CM

## 2014-07-27 DIAGNOSIS — R200 Anesthesia of skin: Secondary | ICD-10-CM

## 2014-07-27 MED ORDER — GADOBENATE DIMEGLUMINE 529 MG/ML IV SOLN
15.0000 mL | Freq: Once | INTRAVENOUS | Status: AC | PRN
  Administered 2014-07-27: 12 mL via INTRAVENOUS

## 2014-07-28 MED ORDER — CITALOPRAM HYDROBROMIDE 40 MG PO TABS: 20.0000 mg | ORAL_TABLET | Freq: Every day | ORAL | Status: DC

## 2014-07-28 NOTE — Progress Notes (Unsigned)
MRI Brain =no demyelination or mass--no mention chiari malf  Next steps-incr celexa to 40(on this after high school at this dose) D/c wellbutrin  Decrease or d/c adderall Eval by Dr Posey Pronto  Her most pronounced symptoms started after wellbutrin and adderall Her hx of anxiety with past conversion reactions is important to remember Her neuro findings are very odd--Dr Posey Pronto can help to characterize these and r/o any other neuropathy or myelopathy

## 2014-07-29 ENCOUNTER — Ambulatory Visit (HOSPITAL_COMMUNITY): Admission: RE | Admit: 2014-07-29 | Payer: 59 | Source: Ambulatory Visit

## 2014-07-31 ENCOUNTER — Other Ambulatory Visit: Payer: Self-pay | Admitting: Family Medicine

## 2014-07-31 NOTE — Progress Notes (Signed)
Erroneous encounter. Neuro ref already placed

## 2014-08-05 ENCOUNTER — Telehealth: Payer: Self-pay

## 2014-08-05 NOTE — Telephone Encounter (Signed)
Patient is needing a refill on her Adderall.   Please call patient at ext [phone] (employee here at [facility] )

## 2014-08-05 NOTE — Telephone Encounter (Signed)
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

## 2014-08-09 MED ORDER — AMPHETAMINE-DEXTROAMPHETAMINE 20 MG PO TABS: ORAL_TABLET | ORAL | Status: DC

## 2014-08-09 NOTE — Telephone Encounter (Signed)
Meds ordered this encounter  Medications  . amphetamine-dextroamphetamine (ADDERALL) 20 MG tablet    Sig: 1 at 7am, noon, and 5pm    Dispense:  90 tablet    Refill:  0

## 2014-08-09 NOTE — Telephone Encounter (Addendum)
Terri Wilson called me and asked if I can please try to get her Adderall Rx today. Dr Laney Pastor, I'm not sure what [name] was going to call her about (see note below) but could you please review her req for RFs? Pended.

## 2014-08-24 ENCOUNTER — Telehealth: Payer: Self-pay | Admitting: Family Medicine

## 2014-08-24 NOTE — Telephone Encounter (Signed)
Patient was checking up on referral

## 2014-09-10 ENCOUNTER — Telehealth: Payer: Self-pay | Admitting: Neurology

## 2014-09-10 ENCOUNTER — Ambulatory Visit: Payer: 59 | Admitting: Neurology

## 2014-09-10 NOTE — Telephone Encounter (Signed)
Noted  

## 2014-09-10 NOTE — Telephone Encounter (Signed)
-----   Message from Vassie Moselle sent at 09/10/2014  9:17 AM EDT ----- Pt called today at 9:16 this am and states that she could not come in today and will call back to resch her new patient appt

## 2014-09-10 NOTE — Telephone Encounter (Signed)
Pt called to canc NP appt for today w/ Dr. Posey Pronto. Marked as a no show since it was same day. Pt did not r/s. Dr. Ninfa Meeker office notified via [facility] referral note / Sherri S.

## 2014-10-06 ENCOUNTER — Ambulatory Visit (INDEPENDENT_AMBULATORY_CARE_PROVIDER_SITE_OTHER): Payer: 59 | Admitting: Family Medicine

## 2014-10-06 VITALS — BP 110/70 | HR 87 | Temp 98.7°F | Resp 16

## 2014-10-06 DIAGNOSIS — R05 Cough: Secondary | ICD-10-CM | POA: Diagnosis not present

## 2014-10-06 DIAGNOSIS — R059 Cough, unspecified: Secondary | ICD-10-CM

## 2014-10-06 DIAGNOSIS — B349 Viral infection, unspecified: Secondary | ICD-10-CM | POA: Diagnosis not present

## 2014-10-06 NOTE — Patient Instructions (Signed)

## 2014-10-06 NOTE — Progress Notes (Signed)
Subjective:    Patient ID: Terri Wilson, female    DOB: 05/25/1985, 29 y.o.   MRN: [id number]  HPI Patient presents today with 5 day history of sore throat, nasal congestion, drainage that was green and brown. Body aches/chills, fevers to 101.5 through yesterday, whenever her tylenol runs out. Persistent, painful cough with thin mucous. Frontal headache. She felt worse in the last two days and is feeling a little better today, thinks she is over the worst of it. Has been working despite feeling terrible. Her mother is taking care of her son so she can rest.  Tried an albuterol breathing treatment at home with no relief of cough. Has several prescription cough syrups at home, none provided relief. She took benadryl last night and was able to sleep 13 hours without being awakened by cough. Taking mucinex, acetaminophen alternating with ibuprofen 400 mg BID.   Former smoker.   Past Medical History  Diagnosis Date  . Anxiety and depression   . Panic attack   . Herpes labialis   . Migraine   . Allergic rhinitis   . Chiari malformation     7 mm  . Depression   . Gestational diabetes mellitus, antepartum   . Gestational diabetes    Past Surgical History  Procedure Laterality Date  . Tonsillectomy and adenoidectomy  2001  . Wisdom tooth extraction     Family History  Problem Relation Age of Onset  . Asthma Other   . Cancer Other   . Diabetes Other   . Hyperlipidemia Other   . Hypertension Other   . Obesity Other   . Heart disease Other    History  Substance Use Topics  . Smoking status: Former Smoker -- 0.20 packs/day for 6 years    Types: Cigarettes  . Smokeless tobacco: Never Used     Comment: down from 1 PPD  . Alcohol Use: No     Review of Systems Wheezing last night only, no SOB, no fever today, diarrhea x 1 at onset of illness, +myalgias, no headache, +nasal congestion, good fluid intake and urine output.    Objective:   Physical Exam  Constitutional: She is  oriented to person, place, and time. She appears well-developed and well-nourished. She appears ill.  HENT:  Head: Normocephalic.  Right Ear: Tympanic membrane, external ear and ear canal normal.  Left Ear: Tympanic membrane, external ear and ear canal normal.  Nose: Mucosal edema and rhinorrhea present. Right sinus exhibits no maxillary sinus tenderness and no frontal sinus tenderness. Left sinus exhibits no maxillary sinus tenderness and no frontal sinus tenderness.  Mouth/Throat: Uvula is midline and mucous membranes are normal. Posterior oropharyngeal erythema present. No oropharyngeal exudate or posterior oropharyngeal edema.  Tonsils absent.   Eyes: Conjunctivae are normal.  Neck: Normal range of motion. Neck supple.  Cardiovascular: Normal rate, regular rhythm and normal heart sounds.   Pulmonary/Chest: Effort normal and breath sounds normal.  Musculoskeletal: Normal range of motion.  Lymphadenopathy:    She has no cervical adenopathy.  Neurological: She is alert and oriented to person, place, and time.  Skin: Skin is warm and dry.  Psychiatric: She has a normal mood and affect. Her behavior is normal. Judgment and thought content normal.  Vitals reviewed.  BP 110/70 mmHg  Pulse 87  Temp(Src) 98.7 F (37.1 C) (Oral)  Resp 16  SpO2 98%  LMP 09/22/2014     Assessment & Plan:  1. Acute viral syndrome -patient past time for antiviral  prescription, suspect flu like illness - seems to be improving - continue ibuprofen/acetaminophen for fever and pain control -push fluids, encouraged her to rest  2. Cough -provided Delsym sample for daytime use - continue mucinex - ok to use benadryl at bedtime - decongestant for post nasal drainage which may be contributing to cough  - RTC if no improvement in 3-4 days, sooner if worsening symptoms- return of fever, severe headache, persistent vomiting, SOB.  Clarene Reamer, FNP-BC  [facility]  10/06/2014 9:14 AM

## 2014-10-08 ENCOUNTER — Ambulatory Visit (INDEPENDENT_AMBULATORY_CARE_PROVIDER_SITE_OTHER): Payer: 59

## 2014-10-08 ENCOUNTER — Ambulatory Visit (INDEPENDENT_AMBULATORY_CARE_PROVIDER_SITE_OTHER): Payer: 59 | Admitting: Internal Medicine

## 2014-10-08 ENCOUNTER — Encounter: Payer: Self-pay | Admitting: Internal Medicine

## 2014-10-08 VITALS — BP 100/68 | HR 116 | Temp 99.7°F | Resp 18

## 2014-10-08 DIAGNOSIS — R05 Cough: Secondary | ICD-10-CM

## 2014-10-08 DIAGNOSIS — R0781 Pleurodynia: Secondary | ICD-10-CM

## 2014-10-08 DIAGNOSIS — R509 Fever, unspecified: Secondary | ICD-10-CM | POA: Diagnosis not present

## 2014-10-08 DIAGNOSIS — R059 Cough, unspecified: Secondary | ICD-10-CM

## 2014-10-08 DIAGNOSIS — R0602 Shortness of breath: Secondary | ICD-10-CM

## 2014-10-08 DIAGNOSIS — J189 Pneumonia, unspecified organism: Secondary | ICD-10-CM

## 2014-10-08 LAB — POCT CBC
Granulocyte percent: 48.2 %G (ref 37–80)
HCT, POC: 41.4 % (ref 37.7–47.9)
Hemoglobin: 13.8 g/dL (ref 12.2–16.2)
Lymph, poc: 3 (ref 0.6–3.4)
MCH, POC: 28.3 pg (ref 27–31.2)
MCHC: 33.4 g/dL (ref 31.8–35.4)
MCV: 84.7 fL (ref 80–97)
MID (cbc): 0.6 (ref 0–0.9)
MPV: 6 fL (ref 0–99.8)
POC Granulocyte: 3.3 (ref 2–6.9)
POC LYMPH PERCENT: 43.6 %L (ref 10–50)
POC MID %: 8.2 %M (ref 0–12)
Platelet Count, POC: 258 10*3/uL (ref 142–424)
RBC: 4.88 M/uL (ref 4.04–5.48)
RDW, POC: 12.9 %
WBC: 6.8 10*3/uL (ref 4.6–10.2)

## 2014-10-08 LAB — POCT URINALYSIS DIPSTICK
Bilirubin, UA: NEGATIVE
Blood, UA: NEGATIVE
Glucose, UA: NEGATIVE
Ketones, UA: NEGATIVE
Leukocytes, UA: NEGATIVE
Nitrite, UA: NEGATIVE
Protein, UA: NEGATIVE
Spec Grav, UA: 1.01
Urobilinogen, UA: 0.2
pH, UA: 7

## 2014-10-08 MED ORDER — ALBUTEROL SULFATE HFA 108 (90 BASE) MCG/ACT IN AERS
2.0000 | INHALATION_SPRAY | Freq: Four times a day (QID) | RESPIRATORY_TRACT | Status: DC | PRN
Start: 2014-10-08 — End: 2014-12-01

## 2014-10-08 MED ORDER — AZITHROMYCIN 500 MG PO TABS: 500.0000 mg | ORAL_TABLET | Freq: Every day | ORAL | Status: DC

## 2014-10-08 MED ORDER — HYDROCODONE-HOMATROPINE 5-1.5 MG/5ML PO SYRP: 5.0000 mL | ORAL_SOLUTION | Freq: Three times a day (TID) | ORAL | Status: DC | PRN

## 2014-10-08 MED ORDER — CEFTRIAXONE SODIUM 1 G IJ SOLR
1.0000 g | Freq: Once | INTRAMUSCULAR | Status: AC
  Administered 2014-10-08: 1 g via INTRAMUSCULAR

## 2014-10-08 NOTE — Progress Notes (Signed)
   Subjective:    Patient ID: Terri Wilson, female    DOB: 10-07-1985, 29 y.o.   MRN: [id number]  HPI Cough, fever, fatigue, sputum green brown, chestpain-pleuritic, progressive 1 week Getting worse, baby is sick also.  Review of Systems     Objective:   Physical Exam  Constitutional: She is oriented to person, place, and time. She appears well-developed and well-nourished. No distress.  HENT:  Head: Normocephalic.  Right Ear: External ear normal.  Left Ear: External ear normal.  Nose: Mucosal edema, rhinorrhea and sinus tenderness present. No epistaxis. Right sinus exhibits frontal sinus tenderness. Right sinus exhibits no maxillary sinus tenderness. Left sinus exhibits frontal sinus tenderness.  Mouth/Throat: Oropharynx is clear and moist.  Eyes: Conjunctivae and EOM are normal. Pupils are equal, round, and reactive to light.  Neck: Normal range of motion. Neck supple.  Cardiovascular: Regular rhythm, S1 normal and S2 normal.  Tachycardia present.   Pulmonary/Chest: No accessory muscle usage. No tachypnea. She has decreased breath sounds. She has no wheezes. She has rhonchi. She has rales.  Abdominal: Soft.  Musculoskeletal: Normal range of motion.  Neurological: She is alert and oriented to person, place, and time. She exhibits normal muscle tone. Coordination normal.  Psychiatric: She has a normal mood and affect. Her behavior is normal.    [facility] reading (PRIMARY) by  Dr Elder Cyphers possible left infiltrate Results for orders placed or performed in visit on 10/08/14  POCT CBC  Result Value Ref Range   WBC 6.8 4.6 - 10.2 K/uL   Lymph, poc 3.0 0.6 - 3.4   POC LYMPH PERCENT 43.6 10 - 50 %L   MID (cbc) 0.6 0 - 0.9   POC MID % 8.2 0 - 12 %M   POC Granulocyte 3.3 2 - 6.9   Granulocyte percent 48.2 37 - 80 %G   RBC 4.88 4.04 - 5.48 M/uL   Hemoglobin 13.8 12.2 - 16.2 g/dL   HCT, POC 41.4 37.7 - 47.9 %   MCV 84.7 80 - 97 fL   MCH, POC 28.3 27 - 31.2 pg   MCHC 33.4 31.8 - 35.4  g/dL   RDW, POC 12.9 %   Platelet Count, POC 258 142 - 424 K/uL   MPV 6.0 0 - 99.8 fL  POCT urinalysis dipstick  Result Value Ref Range   Color, UA yellow    Clarity, UA clear    Glucose, UA neg    Bilirubin, UA neg    Ketones, UA neg    Spec Grav, UA 1.010    Blood, UA neg    pH, UA 7.0    Protein, UA neg    Urobilinogen, UA 0.2    Nitrite, UA neg    Leukocytes, UA Negative           Assessment & Plan:  Fever/Cough/Mycoplasma bronchitis Requested hydromet for cough Rocephin 1g

## 2014-10-08 NOTE — Patient Instructions (Signed)
Pneumonitis Pneumonitis is inflammation of the lungs.  CAUSES  Many things can cause pneumonitis. These can include:   A bacterial or viral infection. Pneumonitis due to an infection is usually called pneumonia.  Work-related exposures, including farm and industrial work. Some substances that can cause pneumonitis include asbestos, silica, inhaled acids, or inhaled chlorine gas.   Repeated exposure to bird feathers, bird feces, or other allergens.   Medicine such as chemotherapy drugs, certain antibiotics, and some heart medicines.   Radiation therapy.   Exposure to mold. A hot tub, sauna, or home humidifier can have mold growing in it, even if it looks clean. The mold can be breathed in through water vapor.  Breathing (aspirating) stomach contents, food, or liquids into the lungs.  SIGNS AND SYMPTOMS   Cough.   Shortness of breath or difficulty breathing.   Fever.   Decreased energy.   Decreased appetite.  DIAGNOSIS  To diagnose pneumonitis, your health care provider will do a complete history and physical exam. Various tests may be ordered, such as:   Pulmonary function test.   Chest X-ray.   CT scan of the lungs.   Bronchoscopy.   Lung biopsy.  TREATMENT  Treatment will depend on the cause of the pneumonitis. If the cause is exposure to a substance, avoiding further exposure to that substance will help reduce your symptoms. Possible medical treatments for pneumonitis include:   Corticosteroid medicine to help decrease inflammation in the lungs.   Antibiotic medicine to help fight a bacterial lung infection.   Oxygen therapy if you are having difficulty breathing.  HOME CARE INSTRUCTIONS   Avoid exposure to any substance identified as the cause of your pneumonitis.   If you must continue to work with substances that can cause pneumonitis, wear a mask to protect your lungs.   Only take over-the-counter or prescription medicine as directed by  your health care provider.   Do not smoke.   If you use inhalers, keep them with you at all times.   Follow up with your health care provider as directed.  SEEK IMMEDIATE MEDICAL CARE IF:   You develop new or increased shortness of breath.   You develop a blue color (cyanosis) under your fingernails.   You have a fever.  MAKE SURE YOU:   Understand these instructions.  Will watch your condition.  Will get help right away if you are not doing well or get worse. Document Released: 10/25/2009 Document Revised: 01/07/2013 Document Reviewed: 10/27/2012 [facility] Patient Information 2015 [place], [place]. This information is not intended to replace advice given to you by your health care provider. Make sure you discuss any questions you have with your health care provider.

## 2014-10-20 ENCOUNTER — Telehealth: Payer: Self-pay

## 2014-10-20 MED ORDER — AMPHETAMINE-DEXTROAMPHETAMINE 20 MG PO TABS: ORAL_TABLET | ORAL | Status: DC

## 2014-10-20 NOTE — Telephone Encounter (Signed)
refill 

## 2014-10-20 NOTE — Telephone Encounter (Signed)
Refilled adderall. I don't see zoloft on her med list I only see celexa.

## 2014-10-20 NOTE — Telephone Encounter (Signed)
Would you arrange medications for Terri Wilson-I'm out of town until Sunday

## 2014-10-20 NOTE — Telephone Encounter (Signed)
Pt would like a refill on adderall, pt also states that 40mg  of zoloft was called into the pharmacy and she only needs 20mg .    Best# [phone] Or call her at extension [phone]

## 2014-10-20 NOTE — Telephone Encounter (Signed)
Pt states that it was for Celexa not Zoloft.

## 2014-10-27 ENCOUNTER — Telehealth: Payer: Self-pay | Admitting: Physician Assistant

## 2014-10-27 NOTE — Telephone Encounter (Signed)
Pt is on a regular 28 day cycle, LMP 09/21/14. Last month was seen twice, DX w/ flu and then pneumonia. She was given a shot of Rochephin and oral zythro on 5/20. Her period was due on 5/30 and it has not come, she took a negative pregnancy test mid-day on 6/6. Yesterday she started leaking colostrum, she has not breastfed in 7 months. She is not on [facility] but has a very small chance of pregnancy. She would like to know if the antibiotics that she took could delay her cycle.

## 2014-10-29 NOTE — Telephone Encounter (Signed)
Spoke to patient regarding her concerns.  She has still not started her menses she had cramping like her normal menses but never bled.  She is ok with it was just curious.

## 2014-11-30 ENCOUNTER — Telehealth: Payer: Self-pay | Admitting: Family Medicine

## 2014-11-30 NOTE — Telephone Encounter (Signed)
Terri Wilson,  Pt was seen last month for URI illness, and then diagnosed with pneumonia a couple of days later by Dr. Elder Cyphers. She has kept a rattling low down in her left lung that she is not able to cough up to relieve it. She does not wheeze when she breathes. Pt would like to know if she should recheck or continue to treat OTC.

## 2014-11-30 NOTE — Telephone Encounter (Signed)
Spoke with patient who reports a feeling of rattling in her lower left lung for several days. She has noticed some shortness of breath. None with lying down. Pulse ox 96% when she checked. No fever, no chills. Has felt warm without fever. Reports feeling a little off for the last couple of days. I recommended that she be seen and evaluated to determine if she needs repeat Xray. Patient agreed.

## 2014-12-01 ENCOUNTER — Telehealth: Payer: Self-pay | Admitting: Family Medicine

## 2014-12-01 ENCOUNTER — Ambulatory Visit (INDEPENDENT_AMBULATORY_CARE_PROVIDER_SITE_OTHER): Payer: 59

## 2014-12-01 ENCOUNTER — Ambulatory Visit (INDEPENDENT_AMBULATORY_CARE_PROVIDER_SITE_OTHER): Payer: 59 | Admitting: Family Medicine

## 2014-12-01 VITALS — BP 120/76 | HR 89 | Temp 97.8°F | Resp 18 | Ht 67.5 in | Wt 127.0 lb

## 2014-12-01 DIAGNOSIS — R0602 Shortness of breath: Secondary | ICD-10-CM | POA: Diagnosis not present

## 2014-12-01 DIAGNOSIS — R05 Cough: Secondary | ICD-10-CM | POA: Diagnosis not present

## 2014-12-01 DIAGNOSIS — I8392 Asymptomatic varicose veins of left lower extremity: Secondary | ICD-10-CM | POA: Diagnosis not present

## 2014-12-01 DIAGNOSIS — Z3200 Encounter for pregnancy test, result unknown: Secondary | ICD-10-CM

## 2014-12-01 DIAGNOSIS — R059 Cough, unspecified: Secondary | ICD-10-CM

## 2014-12-01 DIAGNOSIS — F4323 Adjustment disorder with mixed anxiety and depressed mood: Secondary | ICD-10-CM

## 2014-12-01 DIAGNOSIS — R0989 Other specified symptoms and signs involving the circulatory and respiratory systems: Secondary | ICD-10-CM

## 2014-12-01 LAB — COMPREHENSIVE METABOLIC PANEL
ALT: 10 U/L (ref 0–35)
AST: 14 U/L (ref 0–37)
Albumin: 4.4 g/dL (ref 3.5–5.2)
Alkaline Phosphatase: 66 U/L (ref 39–117)
BUN: 5 mg/dL — ABNORMAL LOW (ref 6–23)
CO2: 25 mEq/L (ref 19–32)
Calcium: 9.3 mg/dL (ref 8.4–10.5)
Chloride: 103 mEq/L (ref 96–112)
Creat: 0.65 mg/dL (ref 0.50–1.10)
Glucose, Bld: 112 mg/dL — ABNORMAL HIGH (ref 70–99)
Potassium: 4.1 mEq/L (ref 3.5–5.3)
Sodium: 141 mEq/L (ref 135–145)
Total Bilirubin: 0.4 mg/dL (ref 0.2–1.2)
Total Protein: 6.9 g/dL (ref 6.0–8.3)

## 2014-12-01 LAB — POCT CBC
Granulocyte percent: 52.3 %G (ref 37–80)
HCT, POC: 41.4 % (ref 37.7–47.9)
Hemoglobin: 14.2 g/dL (ref 12.2–16.2)
Lymph, poc: 3.1 (ref 0.6–3.4)
MCH, POC: 29.1 pg (ref 27–31.2)
MCHC: 34.2 g/dL (ref 31.8–35.4)
MCV: 85 fL (ref 80–97)
MID (cbc): 0.5 (ref 0–0.9)
MPV: 6.9 fL (ref 0–99.8)
POC Granulocyte: 3.9 (ref 2–6.9)
POC LYMPH PERCENT: 41.2 %L (ref 10–50)
POC MID %: 6.5 %M (ref 0–12)
Platelet Count, POC: 204 10*3/uL (ref 142–424)
RBC: 4.87 M/uL (ref 4.04–5.48)
RDW, POC: 13.3 %
WBC: 7.5 10*3/uL (ref 4.6–10.2)

## 2014-12-01 LAB — C-REACTIVE PROTEIN: CRP: <0.5 mg/dL (ref <0.60)

## 2014-12-01 LAB — D-DIMER, QUANTITATIVE: D-Dimer, Quant: 0.27 ug/mL-FEU (ref 0.00–0.48)

## 2014-12-01 LAB — POCT URINE PREGNANCY: Preg Test, Ur: NEGATIVE

## 2014-12-01 MED ORDER — LEVALBUTEROL HCL 0.63 MG/3ML IN NEBU
0.6300 mg | INHALATION_SOLUTION | Freq: Once | RESPIRATORY_TRACT | Status: AC
  Administered 2014-12-01: 0.63 mg via RESPIRATORY_TRACT

## 2014-12-01 NOTE — Telephone Encounter (Signed)
Terri Wilson with [facility] called with STAT D-Dimer, D-Dimer 0.27 (negative). Notified Terri Netters FNP, and she wanted [place] to notify patient and also let her know she is going to order STAT CT Chest would be scheduled for her tomorrow.   Patient notified and voiced understanding.

## 2014-12-01 NOTE — Progress Notes (Signed)
Subjective:    Patient ID: Terri Wilson, female    DOB: 23-Jun-1985, 29 y.o.   MRN: [id number]  HPI This is a very pleasant 29 yo female who is an employee at [employer]. She is accompanied by her mother. She presents today with several days of lower, left anterior chest pain and sensation of "having something in there that needs to come out." Feels like she can't get a good breath. Has had some mildly productive cough with thin sputum but is not able to clear affected area. Feels like it is wheezing and crackling. Was seen 10/08/14 and diagnosed with pneumonia and treated with rocephin and azithromycin. She had full resolution of her symptoms  She woke up 2 days ago feeling "really bad." She has a history of left leg venous problem- she is not sure exactly what. Her lower left leg has a new varicosity on her left calf. She has been having some intermittent left leg numbness that originates from her left buttock and feels like her leg is "asleep." Lasts for a few seconds to a minute or so.   She has had feelings of her heart rate speeding up and slowing down. She feels like it" kind of stops." Has history of PVCs with normal Holter monitor 2014.    She "doesn't feel sick." Feels like her energy level is low. She reports poor self care- she has started smoking again, has been eating poorly (doesn't feel like she has time), does not exercise. Her husband is very supportive and she has additional family support. She is not sleeping well and hasn't for months. She has frequent awakening and has klonapin for occasional use, but is afraid to use in case she doesn't wake up to hear the baby. She denies feeling depressed, stating that her citalopram is working well for her. She feels like she has to do everything for her son- feed him, bathe him, tuck him in and is very worried about "doing something wrong."   Sexually active. Using withdrawal method of contraception.   Review of Systems No fever/chills, no  nausea, no vomiting, no back pain.     Objective:   Physical Exam  Constitutional: She is oriented to person, place, and time. She appears well-developed and well-nourished. No distress.  Appears fatigued.   HENT:  Head: Normocephalic and atraumatic.  Eyes: Conjunctivae are normal.  Neck: Normal range of motion. Neck supple.  Cardiovascular: Normal rate and normal heart sounds.   Occasionally irregular heart beat.   Pulmonary/Chest: Effort normal. No respiratory distress. She exhibits no tenderness.  Rales and rhonchi right middle and lower posterior chest. More pronounced rales and rhonchi left middle and lower posterior and lateral chest.   Musculoskeletal: Normal range of motion. She exhibits no edema or tenderness.  Neurological: She is alert and oriented to person, place, and time.  Skin: Skin is warm and dry. She is not diaphoretic.  Left, medial upper calf with varicosity.   Psychiatric: She has a normal mood and affect. Her behavior is normal. Judgment and thought content normal.  Vitals reviewed.  BP 120/76 mmHg  Pulse 89  Temp(Src) 97.8 F (36.6 C) (Oral)  Resp 18  Ht 5' 7.5" (1.715 m)  Wt 127 lb (57.607 kg)  BMI 19.59 kg/m2  SpO2 98%  LMP 10/30/2014 Wt Readings from Last 3 Encounters:  12/01/14 127 lb (57.607 kg)  07/23/14 134 lb (60.782 kg)  07/20/14 136 lb (61.689 kg)  Orthostatic VS Lying- 108/70, 84 Sitting-100/64, 96  Standing- 106/68, 96  [facility] reading (PRIMARY) by  Dr. Brigitte Pulse- CXR- no acute process, unchanged from prior Xray 10/08/14  EKG reviewed with Dr. Brigitte Pulse. NSR, no change from previous EKG.   Results for orders placed or performed in visit on 12/01/14  D-dimer, quantitative (not at [facility])  Result Value Ref Range   D-Dimer, Quant 0.27 0.00 - 0.48 ug/mL-FEU  POCT CBC  Result Value Ref Range   WBC 7.5 4.6 - 10.2 K/uL   Lymph, poc 3.1 0.6 - 3.4   POC LYMPH PERCENT 41.2 10 - 50 %L   MID (cbc) 0.5 0 - 0.9   POC MID % 6.5 0 - 12 %M   POC Granulocyte  3.9 2 - 6.9   Granulocyte percent 52.3 37 - 80 %G   RBC 4.87 4.04 - 5.48 M/uL   Hemoglobin 14.2 12.2 - 16.2 g/dL   HCT, POC 41.4 37.7 - 47.9 %   MCV 85.0 80 - 97 fL   MCH, POC 29.1 27 - 31.2 pg   MCHC 34.2 31.8 - 35.4 g/dL   RDW, POC 13.3 %   Platelet Count, POC 204 142 - 424 K/uL   MPV 6.9 0 - 99.8 fL  POCT urine pregnancy  Result Value Ref Range   Preg Test, Ur Negative Negative   Patient given Xopenex neb in office without subjective or objective improvement.      Assessment & Plan:  Discussed with Dr. Brigitte Pulse who also examined patient.  1. Cough - POCT CBC - DG Chest 2 View; Future - levalbuterol (XOPENEX) nebulizer solution 0.63 mg; Take 3 mLs (0.63 mg total) by nebulization once. - C-reactive protein - Sedimentation Rate - D-dimer, quantitative (not at [facility]) - CT Chest W Contrast; Future  2. SOB (shortness of breath) - POCT CBC - Comprehensive metabolic panel - EKG 25-QDIY - DG Chest 2 View; Future - levalbuterol (XOPENEX) nebulizer solution 0.63 mg; Take 3 mLs (0.63 mg total) by nebulization once. - C-reactive protein - Sedimentation Rate - D-dimer, quantitative (not at [facility]) - CT Chest W Contrast; Future  3. Encounter for pregnancy test - POCT urine pregnancy- negative  4. Abnormal lung sounds - CT Chest W Contrast; Future  5. Varicose veins of left lower extremity - Ambulatory referral to Vascular Surgery  6. Adjustment disorder with mixed anxiety and depressed mood - Discussed patient's expectations and anxiety and suggested she consider seeing a therapist to talk about some of her worries. Will discuss this further once acute symptoms resolved.    Clarene Reamer, FNP-BC  [facility]  12/01/2014 9:32 PM

## 2014-12-02 ENCOUNTER — Telehealth: Payer: Self-pay | Admitting: Physician Assistant

## 2014-12-02 ENCOUNTER — Telehealth: Payer: Self-pay | Admitting: Family Medicine

## 2014-12-02 ENCOUNTER — Ambulatory Visit (INDEPENDENT_AMBULATORY_CARE_PROVIDER_SITE_OTHER): Payer: 59 | Admitting: Family Medicine

## 2014-12-02 ENCOUNTER — Ambulatory Visit (HOSPITAL_COMMUNITY)
Admission: RE | Admit: 2014-12-02 | Discharge: 2014-12-02 | Disposition: A | Discharge status: Home / Self Care | Payer: 59 | Source: Ambulatory Visit | Attending: Family Medicine | Admitting: Family Medicine

## 2014-12-02 ENCOUNTER — Encounter (HOSPITAL_COMMUNITY): Payer: Self-pay

## 2014-12-02 VITALS — BP 100/58 | HR 111 | Temp 98.5°F | Resp 16

## 2014-12-02 DIAGNOSIS — R0602 Shortness of breath: Secondary | ICD-10-CM | POA: Diagnosis not present

## 2014-12-02 DIAGNOSIS — R51 Headache: Secondary | ICD-10-CM | POA: Diagnosis not present

## 2014-12-02 DIAGNOSIS — R05 Cough: Secondary | ICD-10-CM | POA: Diagnosis not present

## 2014-12-02 DIAGNOSIS — R0989 Other specified symptoms and signs involving the circulatory and respiratory systems: Secondary | ICD-10-CM | POA: Diagnosis not present

## 2014-12-02 DIAGNOSIS — R5382 Chronic fatigue, unspecified: Secondary | ICD-10-CM

## 2014-12-02 DIAGNOSIS — R059 Cough, unspecified: Secondary | ICD-10-CM

## 2014-12-02 DIAGNOSIS — R55 Syncope and collapse: Secondary | ICD-10-CM

## 2014-12-02 DIAGNOSIS — R042 Hemoptysis: Secondary | ICD-10-CM | POA: Diagnosis not present

## 2014-12-02 DIAGNOSIS — R519 Headache, unspecified: Secondary | ICD-10-CM

## 2014-12-02 DIAGNOSIS — N643 Galactorrhea not associated with childbirth: Secondary | ICD-10-CM

## 2014-12-02 DIAGNOSIS — O926 Galactorrhea: Secondary | ICD-10-CM | POA: Diagnosis not present

## 2014-12-02 LAB — SEDIMENTATION RATE: Sed Rate: 1 mm/hr (ref 0–20)

## 2014-12-02 MED ORDER — AZITHROMYCIN 500 MG PO TABS
500.0000 mg | ORAL_TABLET | Freq: Every day | ORAL | Status: DC
Start: 2014-12-02 — End: 2014-12-03

## 2014-12-02 MED ORDER — BENZONATATE 200 MG PO CAPS: 200.0000 mg | ORAL_CAPSULE | Freq: Three times a day (TID) | ORAL | Status: DC | PRN

## 2014-12-02 MED ORDER — IOHEXOL 300 MG/ML  SOLN
80.0000 mL | Freq: Once | INTRAMUSCULAR | Status: AC | PRN
  Administered 2014-12-02: 80 mL via INTRAVENOUS

## 2014-12-02 NOTE — Telephone Encounter (Signed)
I reviewed her CT results with her.  She had some blood tinged clear sputum last night and she is still having shortness of breath.  She was instructed to RTC for a recheck with Dr Brigitte Pulse to have a repeat exam.

## 2014-12-02 NOTE — Telephone Encounter (Signed)
Pt scheduled for this morning. Pt and mother notified.

## 2014-12-02 NOTE — Progress Notes (Signed)
Subjective:    Patient ID: Terri Wilson, female    DOB: February 05, 1986, 29 y.o.   MRN: [id number] Chief Complaint  Patient presents with  . Follow-up    HPI  Terri Wilson started to feel like she was having a discomfort in anterior left lower lobe of lung.  She has been having shooting pains through her heart recurrently - a deep ache along her left and sometimes come with shooting pains is sternum radiating to left sholder and occ like last night radiating to left arm - deep back ache down whole arm.  Her LLL rales have been occuring about 1x/wk for the past sev mos. But now she has had them intermittently for several times a day. Short of breath, dizzy, blurred vision.  Sev yrs ago she had PVCs.  Past Medical History  Diagnosis Date  . Anxiety and depression   . Panic attack   . Herpes labialis   . Migraine   . Allergic rhinitis   . Chiari malformation     7 mm  . Depression   . Gestational diabetes mellitus, antepartum   . Gestational diabetes    Past Surgical History  Procedure Laterality Date  . Tonsillectomy and adenoidectomy  2001  . Wisdom tooth extraction     Current Outpatient Prescriptions on File Prior to Visit  Medication Sig Dispense Refill  . amphetamine-dextroamphetamine (ADDERALL) 20 MG tablet 1 at 7am, noon, and 5pm 90 tablet 0  . citalopram (CELEXA) 40 MG tablet Take 0.5 tablets (20 mg total) by mouth daily. 90 tablet 1  . clonazePAM (KLONOPIN) 0.5 MG tablet Take 1 tablet (0.5 mg total) by mouth 3 (three) times daily as needed for anxiety. 30 tablet 1   No current facility-administered medications on file prior to visit.   Allergies  Allergen Reactions  . Lamictal [Lamotrigine] Anaphylaxis  . Hydrocodone Nausea Only    Tolerates codeine  . Vancomycin Other (See Comments)    Feels hot   Family History  Problem Relation Age of Onset  . Asthma Other   . Cancer Other   . Diabetes Other   . Hyperlipidemia Other   . Hypertension Other   . Obesity Other    . Heart disease Other    History   Social History  . Marital Status: Married    Spouse Name: [name]  . Number of Children: 0  . Years of Education: 14   Occupational History  . BUSINESS ASSISTANT [employer]    [facility]   Social History Main Topics  . Smoking status: Former Smoker -- 0.20 packs/day for 6 years    Types: Cigarettes  . Smokeless tobacco: Never Used     Comment: down from 1 PPD  . Alcohol Use: No  . Drug Use: No  . Sexual Activity:    Partners: Male   Other Topics Concern  . None   Social History Narrative   Lives with husband.   Caffeine Use: $RemoveBefor'40mg'eYHHCQETQAJO$ /day   Depression screen [facility] 2/9 12/03/2014 12/01/2014 05/12/2014 09/24/2013  Decreased Interest 0 0 0 1  Down, Depressed, Hopeless 0 0 0 1  PHQ - 2 Score 0 0 0 2      Review of Systems  Constitutional: Positive for activity change and fatigue. Negative for fever, chills, diaphoresis and unexpected weight change.  HENT: Positive for sinus pressure. Negative for congestion, facial swelling, rhinorrhea and sore throat.   Eyes: Positive for pain and visual disturbance. Negative for photophobia and redness.  Respiratory: Positive  for chest tightness, shortness of breath and wheezing. Negative for apnea, cough and choking.   Cardiovascular: Positive for chest pain and palpitations. Negative for leg swelling.  Genitourinary: Negative for menstrual problem and pelvic pain.  Musculoskeletal: Positive for myalgias and arthralgias. Negative for gait problem.  Skin: Negative for rash.  Neurological: Positive for dizziness, syncope, weakness, light-headedness and headaches. Negative for facial asymmetry.  Hematological: Negative for adenopathy.  Psychiatric/Behavioral: Positive for sleep disturbance and decreased concentration. Negative for hallucinations and dysphoric mood. The patient is nervous/anxious.       hair loss  Objective:  BP 100/58 mmHg  Pulse 111  Temp(Src) 98.5 F (36.9 C) (Oral)  Resp 16  SpO2 98%  PF  460 L/min  LMP 10/30/2014  Physical Exam  Constitutional: She is oriented to person, place, and time. She appears well-developed and well-nourished. No distress.  HENT:  Head: Normocephalic and atraumatic.  Right Ear: External ear normal.  Left Ear: External ear normal.  Eyes: Conjunctivae are normal. No scleral icterus.  Neck: Normal range of motion. Neck supple. No thyromegaly present.  Cardiovascular: Normal rate, regular rhythm, normal heart sounds and intact distal pulses.   Pulmonary/Chest: Effort normal. No tachypnea. No respiratory distress. She has no decreased breath sounds. She has wheezes (subtle bibasilar Lt > Rt on forced exhilation). She has rhonchi (bibasilar Lt > Rt on deep inspiration). She has no rales. She exhibits no tenderness.  Musculoskeletal: She exhibits no edema.  Lymphadenopathy:    She has no cervical adenopathy.  Neurological: She is alert and oriented to person, place, and time.  Skin: Skin is warm and dry. She is not diaphoretic. No erythema.  Psychiatric: She has a normal mood and affect. Her behavior is normal.      Peak flow 460; predicted 497    Assessment & Plan:  A serum cortisol <18 needs for further eval.  Pt will have labs drawn before 9 am tomorrow fasting. 1. Abnormal lung sounds   2. Hemoptysis   3. Chronic daily headache   4. Inappropriate lactation   5. Chronic fatigue   6. Syncope and collapse   Called [place] [facility] and spoke to Dr. Halford Chessman - pulm dr of the day.  He advised that her atypical sxs may be due to endometriosis if pt has irreg or painful menses or may have a component of brochiectasis or could have had a very small aspirated obstruction.  Rec considering bronchoscopy if sxs do not improve.  Pt reports a large symptomatic improvement after zpack sev mos ago though sxs never completely resolved. Now has worsened again the past wk so will try azithro again though doubt infection w/ nml cbc/esr/crp/CXR and CT.  Refer to pulm if sxs  cont. I am concerned that her anxiety is worsening her sxs so therapeutic trial of alprazolam to see home much treating the anxiety affects her somatic complaints.  Screen for hormonal or autoimmune etiology as h/o chiari malformation with other sxs of hair loss, fatigue, and continued breast milk production though hasn't breastfed in 9 mos .  Not on birth control.  Periods nml, no sig dysmenorrhea.    Orders Placed This Encounter  Procedures  . Prolactin  . Thyroid Panel With TSH  . Cortisol-am, blood    Needs to be drawn before 9 am    Standing Status: Future     Number of Occurrences: 1     Standing Expiration Date: 12/02/2015    Meds ordered this encounter  Medications  .  DISCONTD: azithromycin (ZITHROMAX) 500 MG tablet    Sig: Take 1 tablet (500 mg total) by mouth daily.    Dispense:  3 tablet    Refill:  0  . DISCONTD: benzonatate (TESSALON) 200 MG capsule    Sig: Take 1 capsule (200 mg total) by mouth 3 (three) times daily as needed for cough.    Dispense:  30 capsule    Refill:  0  . ALPRAZolam (NIRAVAM) 0.5 MG dissolvable tablet    Sig: Take 1 tablet (0.5 mg total) by mouth 2 (two) times daily as needed for anxiety.    Dispense:  20 tablet    Refill:  0    Delman Cheadle, MD MPH  Dg Chest 2 View  12/01/2014   CLINICAL DATA:  Cough and congestion for 3 days, history of tobacco use  EXAM: CHEST - 2 VIEW  COMPARISON:  10/08/1958  FINDINGS: Cardiac shadow is stable. The lungs are clear bilaterally. No focal infiltrate or sizable effusion is noted. No bony abnormality is seen.  IMPRESSION: No active disease.   Electronically Signed   By: Inez Catalina M.D.   On: 12/01/2014 19:02   Ct Chest W Contrast  12/02/2014   CLINICAL DATA:  Cough, abnormal lung sounds, shortness of Breath  EXAM: CT CHEST WITH CONTRAST  TECHNIQUE: Multidetector CT imaging of the chest was performed during intravenous contrast administration.  CONTRAST:  20mL OMNIPAQUE IOHEXOL 300 MG/ML  SOLN  COMPARISON:  None.   FINDINGS: Central airways are patent. Images of the thoracic inlet are unremarkable.  There is no mediastinal hematoma or adenopathy. No hilar adenopathy.  No destructive rib lesions are noted.  Sagittal images of the spine are unremarkable. Sagittal view of the sternum is unremarkable.  Images of the lung parenchyma shows no acute infiltrate or pleural effusion. No pulmonary edema. No focal consolidation. Axial image 40 there is 3 mm noncalcified nodule in right lower lobe laterally.  The visualized upper abdomen is unremarkable. Central pulmonary artery and thoracic aorta is unremarkable. Heart size within normal limits. No pericardial effusion. No pneumothorax.  IMPRESSION: 1. There is no mediastinal hematoma or adenopathy. 2. No acute infiltrate or pulmonary edema. There is 3 mm non calcified nodule in right lower lobe laterally. 3. No destructive bony lesions.  No pneumothorax.   Electronically Signed   By: Lahoma Crocker M.D.   On: 12/02/2014 10:43

## 2014-12-02 NOTE — Telephone Encounter (Signed)
Received call from a friend of [name]'s who reported that Terri Wilson had gotten upset a little while ago and developed shortness of breath, tingling all over. EMS was called and was there while I was on the phone with the friend. [name] had taken her clonazepam and was starting to feel better. Her pulse ox was reported to be 100%. I advised her to go to ED if breathing not significantly improved.

## 2014-12-02 NOTE — Patient Instructions (Signed)
Atelectasis Atelectasis is a collapse of the small air sacs in the lungs (alveoli). When this occurs, all or part of a lung collapses and becomes airless. It can be caused by various things and is a common problem after surgery. The severity of atelectasis will vary depending on the size of the area involved and the underlying cause of the condition. CAUSES  There are multiple causes for atelectasis:   Shallow breathing, particularly if there is an injury to your chest wall or abdomen that makes it painful to take a deep breath. This commonly occurs after surgery.  Obstruction of your airways (bronchi or bronchioles). This may be caused by a buildup of mucus (mucus plug), tumors, blood clots (pulmonary embolus), or inhaled foreign bodies. Mucus plugs occur when the lungs do not expand enough to get rid of mucus.  Outside pressure on the lung. This may be caused by tumors, fluid (pleural effusion), or a leakage of air between the lung and rib cage (pneumothorax).   Infections such as pneumonia.  Scarring in lung tissue left over from previous infection or injury.  Some diseases such as cystic fibrosis. SIGNS AND SYMPTOMS  Often, atelectasis will have no symptoms. When symptoms occur, they include:  Shortness of breath.   Bluish color to your nails, lips, or mouth (cyanosis). DIAGNOSIS  Your health care provider may suspect atelectasis based on symptoms and physical findings. A chest X-ray may be done to confirm the diagnosis. More specialized X-ray exams are sometimes required.  TREATMENT  Treatment will depend on the cause of the atelectasis. Treatment may include:  Purposeful coughing to loosen mucus plugs in the lungs.  Chest physiotherapy. This consists of clapping or percussion on the chest over the lungs to further loosen mucus plugs.  Postural drainage techniques. This involves positioning your body so your head is lower than your chest. [place]  relaxed deep breathing whenever you are sitting down. A good technique is to take a few relaxed deep breaths each time a commercial comes on if you are watching television.  If you were given a deep breathing device (such as an incentive spirometer) or a mucus clearance device, use this regularly as directed by your health care provider.  Try to cough several times a day as directed by your health care provider.  Perform any chest physiotherapy or postural drainage techniques as directed by your health care provider. If necessary, have someone (such as a family member) assist you with these techniques.  When lying down, lie on the unaffected side to encourage mucus drainage.  Stay physically active as much as possible. SEEK IMMEDIATE MEDICAL CARE IF:   You develop increasing problems with your breathing.   You develop severe chest pain.   You develop severe coughing, or you cough up blood.   You have a fever or persistent symptoms for more than 2-3 days.   You have a fever and your symptoms suddenly get worse.  MAKE SURE YOU:  Understand these instructions.  Will watch your condition.  Will get help right away if you are not doing well or get worse. Document Released: 05/07/2005 Document Revised: 05/12/2013 Document Reviewed: 11/12/2012 [facility] Patient Information 2015 [place], [place]. This information is not intended to replace advice given to you by your health care provider. Make sure you discuss any questions you have with your health care provider.

## 2014-12-03 ENCOUNTER — Encounter: Payer: Self-pay | Admitting: Internal Medicine

## 2014-12-03 ENCOUNTER — Encounter: Payer: Self-pay | Admitting: Family Medicine

## 2014-12-03 ENCOUNTER — Other Ambulatory Visit (INDEPENDENT_AMBULATORY_CARE_PROVIDER_SITE_OTHER): Payer: 59 | Admitting: Family Medicine

## 2014-12-03 ENCOUNTER — Ambulatory Visit: Payer: 59 | Admitting: Family Medicine

## 2014-12-03 ENCOUNTER — Ambulatory Visit (INDEPENDENT_AMBULATORY_CARE_PROVIDER_SITE_OTHER): Payer: 59 | Admitting: Internal Medicine

## 2014-12-03 VITALS — BP 108/72 | HR 125 | Temp 98.2°F | Resp 16 | Ht 67.75 in | Wt 125.0 lb

## 2014-12-03 VITALS — BP 102/70 | HR 116 | Ht 67.75 in | Wt 126.8 lb

## 2014-12-03 DIAGNOSIS — R202 Paresthesia of skin: Secondary | ICD-10-CM

## 2014-12-03 DIAGNOSIS — R0789 Other chest pain: Secondary | ICD-10-CM | POA: Diagnosis not present

## 2014-12-03 DIAGNOSIS — H538 Other visual disturbances: Secondary | ICD-10-CM | POA: Diagnosis not present

## 2014-12-03 DIAGNOSIS — R0781 Pleurodynia: Secondary | ICD-10-CM | POA: Diagnosis not present

## 2014-12-03 DIAGNOSIS — R200 Anesthesia of skin: Secondary | ICD-10-CM

## 2014-12-03 DIAGNOSIS — F410 Panic disorder [episodic paroxysmal anxiety] without agoraphobia: Secondary | ICD-10-CM

## 2014-12-03 DIAGNOSIS — O926 Galactorrhea: Secondary | ICD-10-CM | POA: Diagnosis not present

## 2014-12-03 DIAGNOSIS — R0989 Other specified symptoms and signs involving the circulatory and respiratory systems: Secondary | ICD-10-CM

## 2014-12-03 DIAGNOSIS — J471 Bronchiectasis with (acute) exacerbation: Secondary | ICD-10-CM | POA: Diagnosis not present

## 2014-12-03 DIAGNOSIS — R51 Headache: Secondary | ICD-10-CM

## 2014-12-03 DIAGNOSIS — Z720 Tobacco use: Secondary | ICD-10-CM

## 2014-12-03 DIAGNOSIS — R519 Headache, unspecified: Secondary | ICD-10-CM

## 2014-12-03 DIAGNOSIS — R5382 Chronic fatigue, unspecified: Secondary | ICD-10-CM | POA: Diagnosis not present

## 2014-12-03 DIAGNOSIS — F17210 Nicotine dependence, cigarettes, uncomplicated: Secondary | ICD-10-CM

## 2014-12-03 DIAGNOSIS — N643 Galactorrhea not associated with childbirth: Secondary | ICD-10-CM

## 2014-12-03 LAB — PROLACTIN: Prolactin: 16.4 ng/mL

## 2014-12-03 LAB — THYROID PANEL WITH TSH
Free Thyroxine Index: 2.3 (ref 1.4–3.8)
T3 Uptake: 30 % (ref 22–35)
T4, Total: 7.8 ug/dL (ref 4.5–12.0)
TSH: 1.695 u[IU]/mL (ref 0.350–4.500)

## 2014-12-03 MED ORDER — ALPRAZOLAM 0.5 MG PO TBDP: 0.5000 mg | ORAL_TABLET | Freq: Two times a day (BID) | ORAL | Status: DC | PRN

## 2014-12-03 MED ORDER — MUCINEX DM MAXIMUM STRENGTH 60-1200 MG PO TB12: 1.0000 | ORAL_TABLET | Freq: Two times a day (BID) | ORAL | Status: DC

## 2014-12-03 MED ORDER — AZITHROMYCIN 500 MG PO TABS: 500.0000 mg | ORAL_TABLET | Freq: Every day | ORAL | Status: DC

## 2014-12-03 MED ORDER — ALBUTEROL SULFATE HFA 108 (90 BASE) MCG/ACT IN AERS: 2.0000 | INHALATION_SPRAY | RESPIRATORY_TRACT | Status: DC | PRN

## 2014-12-03 MED ORDER — AZITHROMYCIN 500 MG PO TABS
500.0000 mg | ORAL_TABLET | Freq: Every day | ORAL | Status: DC
Start: 2014-12-03 — End: 2014-12-18

## 2014-12-03 NOTE — Progress Notes (Signed)
Subjective:    Patient ID: Terri Wilson, female    DOB: Jan 08, 1986, 29 y.o.   MRN: [id number] Chief Complaint  Patient presents with  . Follow-up  . Shortness of Breath  . Numbness    TINGLING in the face,fingers and body  . Dizziness    blurred vision    HPI   Pt had fasting labs done this a.m.  Worked in acutely today as after pt was seen in [place] yesterday, last night she had recurrent severe cough and had a little bright red blood laced into the mucous she was able to expectorant. Throat hurts due to cough but has then been able to get up a little more sputum.that did not have blood in it.  She coughed to the point of regurg last night. She was in the bathroom last night (not using toilet) and was feeling very weak, diaphoretic, and dizzy and then had sudden syncopal episode - she is not sure about this but thinks she had LOC for a second. Is having more palpitations and LUQ/LLL pain worse with deep breathing.  Is concerned that if she goes to pulmonology for eval that the lung rhonchi would resolve prior to that as they have been very intermittent in the past several mos.  Past Medical History  Diagnosis Date  . Anxiety and depression   . Panic attack   . Herpes labialis   . Migraine   . Allergic rhinitis   . Chiari malformation     7 mm  . Depression   . Gestational diabetes mellitus, antepartum   . Gestational diabetes    Current Outpatient Prescriptions on File Prior to Visit  Medication Sig Dispense Refill  . ALPRAZolam (NIRAVAM) 0.5 MG dissolvable tablet Take 1 tablet (0.5 mg total) by mouth 2 (two) times daily as needed for anxiety. 20 tablet 0  . amphetamine-dextroamphetamine (ADDERALL) 20 MG tablet 1 at 7am, noon, and 5pm 90 tablet 0  . citalopram (CELEXA) 40 MG tablet Take 0.5 tablets (20 mg total) by mouth daily. 90 tablet 1  . clonazePAM (KLONOPIN) 0.5 MG tablet Take 1 tablet (0.5 mg total) by mouth 3 (three) times daily as needed for anxiety. 30 tablet 1   No  current facility-administered medications on file prior to visit.   Allergies  Allergen Reactions  . Lamictal [Lamotrigine] Anaphylaxis  . Hydrocodone Nausea Only    Tolerates codeine  . Vancomycin Other (See Comments)    Feels hot     Review of Systems  Constitutional: Positive for activity change and fatigue. Negative for fever, chills and diaphoresis.  HENT: Positive for sore throat. Negative for congestion, postnasal drip, rhinorrhea and sneezing.   Eyes: Positive for visual disturbance. Negative for photophobia.  Respiratory: Positive for cough, chest tightness, shortness of breath and wheezing. Negative for stridor.   Cardiovascular: Positive for chest pain and palpitations.  Gastrointestinal: Negative for nausea and vomiting.  Skin: Negative for rash.  Neurological: Positive for dizziness, weakness, light-headedness, numbness and headaches. Negative for seizures and facial asymmetry.  Hematological: Negative for adenopathy.  Psychiatric/Behavioral: Positive for sleep disturbance and decreased concentration. Negative for dysphoric mood. The patient is nervous/anxious.        Objective:  BP 108/72 mmHg  Pulse 125  Temp(Src) 98.2 F (36.8 C) (Oral)  Resp 16  Ht 5' 7.75" (1.721 m)  Wt 125 lb (56.7 kg)  BMI 19.14 kg/m2  LMP 10/30/2014  Physical Exam  Constitutional: She is oriented to person, place, and time.  She appears well-developed and well-nourished. No distress.  HENT:  Head: Normocephalic and atraumatic.  Right Ear: External ear normal.  Left Ear: External ear normal.  Eyes: Conjunctivae and EOM are normal. Pupils are equal, round, and reactive to light. No scleral icterus.  Neck: Normal range of motion. Neck supple. No spinous process tenderness and no muscular tenderness present. Carotid bruit is not present. Normal range of motion present. No thyroid mass and no thyromegaly present.  Cardiovascular: Regular rhythm, normal heart sounds and intact distal pulses.    Occasional extrasystoles are present. Tachycardia present.   Pulmonary/Chest: Effort normal. No respiratory distress. She has rhonchi in the left lower field.  Inspiratory rhonchi in LLL  Musculoskeletal: She exhibits no edema.  Lymphadenopathy:    She has no cervical adenopathy.  Neurological: She is alert and oriented to person, place, and time.  Skin: Skin is warm and dry. She is not diaphoretic. No erythema.  Psychiatric: Her mood appears anxious. Her affect is labile. She is agitated and slowed.  Pt swaying, often has eyelids drooping      [facility] reading (PRIMARY) by  Dr. Brigitte Pulse. EKG: sinus tach, unchanged from prior.     Assessment & Plan:   1. Numbness and tingling   2. Chronic daily headache   3. Blurred vision   4. Abnormal lung sounds   5. Pleuritic chest pain   6. Panic attack   7. Bronchiectasis with acute exacerbation    Unknown etiology - will refer to pulm as normal w/u with CXR and non-contrast chest CT and nml labs.  Has not started azithro, tessalon that were rx'ed yest.  Pt worked in urgently to see Dr. Melvyn Novas at pulm this afternoon.  Recheck in 5d.  Orders Placed This Encounter  Procedures  . EKG 12-Lead    Meds ordered this encounter  Medications  . DISCONTD: azithromycin (ZITHROMAX) 500 MG tablet    Sig: Take 1 tablet (500 mg total) by mouth daily.    Dispense:  3 tablet    Refill:  0  . DISCONTD: albuterol (PROVENTIL HFA;VENTOLIN HFA) 108 (90 BASE) MCG/ACT inhaler    Sig: Inhale 2 puffs into the lungs every 4 (four) hours as needed for wheezing or shortness of breath (cough, shortness of breath or wheezing.).    Dispense:  1 Inhaler    Refill:  1  . DISCONTD: Dextromethorphan-Guaifenesin (MUCINEX DM MAXIMUM STRENGTH) 60-1200 MG TB12    Sig: Take 1 tablet by mouth every 12 (twelve) hours.    Dispense:  14 each    Refill:  1  . Dextromethorphan-Guaifenesin (MUCINEX DM MAXIMUM STRENGTH) 60-1200 MG TB12    Sig: Take 1 tablet by mouth every 12 (twelve)  hours.    Dispense:  14 each    Refill:  1  . azithromycin (ZITHROMAX) 500 MG tablet    Sig: Take 1 tablet (500 mg total) by mouth daily.    Dispense:  3 tablet    Refill:  0  . albuterol (PROVENTIL HFA;VENTOLIN HFA) 108 (90 BASE) MCG/ACT inhaler    Sig: Inhale 2 puffs into the lungs every 4 (four) hours as needed for wheezing or shortness of breath (cough, shortness of breath or wheezing.).    Dispense:  1 Inhaler    Refill:  1     Delman Cheadle, MD MPH

## 2014-12-03 NOTE — Patient Instructions (Signed)
The crackling is consistent with bronchiectasis / Bronchiectasis =   you have scarring of your bronchial tubes which means that they don't function perfectly normally and mucus tends to pool in certain areas of your lung which can cause pneumonia and further scarring of your lung and bronchial tubes  Whenever you develop cough congestion take mucinex or mucinex dm > these will help keep the mucus loose and flowing but if your condition worsens you need to seek help immediately preferably here or somewhere inside the [facility] system to compare xrays ( worse = darker or bloody mucus or pain on breathing in)    Classic subdiaphragmatic pain pattern suggests ibs:  Stereotypical, migratory with a very limited distribution of pain locations, daytime, not exacerbated by ex or coughing, worse in sitting position, associated with generalized abd bloating, not present supine due to the dome effect of the diaphragm is  canceled in that position. Frequently these patients have had multiple negative GI workups and CT scans.  Treatment consists of avoiding foods that cause gas (especially beans and raw vegetables like spinach and salads)  and citrucel 1 heaping tsp twice daily with a large glass of water.  Pain should improve w/in 2 weeks and if not then consider further GI work up.

## 2014-12-03 NOTE — Progress Notes (Signed)
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 Pulmonary Consult    Referred by Dr. Delman Cheadle. Pt c/o "rattling in left lung", SOB and chest pressure for the past 2.5 months. She had hemoptysis 3 days ago. She states that she feels SOB all of the time.    Pt's history changed multiple times during the interview and was complicated by the use of medical terms when I was trying to learn more about her symptoms "I have chronic bronchitis"   =  What she actually has is several episodes of cough per year requiring abx with resolution/ minimally productive "I have rales in my chest" " I have atelectasis" " I am sob all the time"  No worse with activity, "just worse all the time" In fact, patient  failed to answer a single question I asked her  in a straightforward manner, tending to go off on tangents or answer questions with ambiguous medical terms or diagnoses and seemed aggravated  when asked the same question more than once for clarification and eventually got up and left before I was through with the interview or the exam, stating "why won't you just listen to me" (ie why won't you just let me tell you what's wrong with me using medical terms/ phrases).   She does have some new discomfort in LUQ day > noct somewhat migratory / positional ? IBS (see other cp a/p)    ROS was taken and is mostly positive to point of being  unhelpful sorting out her symptoms (see scanned document)   No obvious other patterns in day to day or daytime variabilty or assoc classically pleuritic or ex cp or   overt sinus or hb symptoms. No unusual exp hx or h/o childhood pna/ asthma or knowledge of premature birth.  Sleeping ok without nocturnal  or early am exacerbation  of respiratory  c/o's or need for noct saba. Also denies any obvious fluctuation of symptoms with weather or environmental changes or other aggravating or alleviating factors except as outlined above - no better with inhalers  Current Medications, Allergies, Complete Past Medical History, Past Surgical History, Family History, and Social History were reviewed in [employer] record.             Review of Systems     Objective:   Physical Exam Wt Readings from Last 3 Encounters:  12/03/14 126 lb 12.8 oz (57.516 kg)  12/03/14 125 lb (56.7 kg)  12/01/14 127 lb (57.607 kg)    Vital signs reviewed   amb very healthy appearing wf nad   HEENT: nl dentition, turbinates, and orophanx except has split uvula . Nl external ear canals without cough reflex   NECK :  without JVD/Nodes/TM/ nl carotid upstrokes bilaterally   LUNGS: no acc muscle use, clear to A and P bilaterally  without cough on insp or exp maneuvers   CV:  RRR  no s3 or murmur or increase in P2, no edema   ABD:  soft and nontender with nl excursion in the supine position. No bruits or organomegaly, bowel sounds nl/ minimal tenderness LUQ under costal margin  SKIN: warm and dry without lesions        CT chest with contrast  12/02/2014 :     I personally reviewed images and agree with radiology impression as follows:   1. There is no mediastinal hematoma or adenopathy. 2. No acute infiltrate or pulmonary edema. There is 3 mm non calcified nodule in right lower lobe laterally. 3. No destructive bony lesions. No pneumothorax.   Lab Results  Component Value Date    DDIMER 0.27 12/01/2014      Lab Results  Component Value Date   ESRSEDRATE 1 12/01/2014      Recent Labs Lab 12/01/14 1630  NA 141  K 4.1  CL 103  CO2 25  BUN 5*  CREATININE 0.65  GLUCOSE 112*     Lab 12/01/14 1633  HGB 14.2  HCT 41.4  WBC 7.5     Lab Results  Component Value Date   TSH 1.695 12/02/2014          Assessment:

## 2014-12-03 NOTE — Patient Instructions (Addendum)
You are orthostatic pulse by pulse but not by blood pressure - try to drink a lot of fluids, stay away from caffeine.  Eating some salty foods and backing off on your adderall over the weekend might help.  Try to rest.   Near-Syncope Near-syncope (commonly known as near fainting) is sudden weakness, dizziness, or feeling like you might pass out. During an episode of near-syncope, you may also develop pale skin, have tunnel vision, or feel sick to your stomach (nauseous). Near-syncope may occur when getting up after sitting or while standing for a long time. It is caused by a sudden decrease in blood flow to the brain. This decrease can result from various causes or triggers, most of which are not serious. However, because near-syncope can sometimes be a sign of something serious, a medical evaluation is required. The specific cause is often not determined. HOME CARE INSTRUCTIONS  Monitor your condition for any changes. The following actions may help to alleviate any discomfort you are experiencing:  Have someone stay with you until you feel stable.  Lie down right away and prop your feet up if you start feeling like you might faint. Breathe deeply and steadily. Wait until all the symptoms have passed. Most of these episodes last only a few minutes. You may feel tired for several hours.   Drink enough fluids to keep your urine clear or pale yellow.   If you are taking blood pressure or heart medicine, get up slowly when seated or lying down. Take several minutes to sit and then stand. This can reduce dizziness.  Follow up with your health care provider as directed. SEEK IMMEDIATE MEDICAL CARE IF:   You have a severe headache.   You have unusual pain in the chest, abdomen, or back.   You are bleeding from the mouth or rectum, or you have black or tarry stool.   You have an irregular or very fast heartbeat.   You have repeated fainting or have seizure-like jerking during an episode.    You faint when sitting or lying down.   You have confusion.   You have difficulty walking.   You have severe weakness.   You have vision problems.  MAKE SURE YOU:   Understand these instructions.  Will watch your condition.  Will get help right away if you are not doing well or get worse. Document Released: 05/07/2005 Document Revised: 05/12/2013 Document Reviewed: 10/10/2012 [facility] Patient Information 2015 [place], [place]. This information is not intended to replace advice given to you by your health care provider. Make sure you discuss any questions you have with your health care provider.

## 2014-12-03 NOTE — Progress Notes (Signed)
Patient ID: Terri Wilson, female   DOB: 13-Jun-1985, 29 y.o.   MRN: [id number] Pt saw and examined by myself as well. Reviewed documentation, EKG, and chest xray.  Unchanged after duoneb.  Stat d-dimer - if + -> CTA, if neg - > chest ct.  Recheck w/ me tomorrow. Agree w/ excellent assessment and plan by Carlean Purl. Delman Cheadle, MD MPH

## 2014-12-04 ENCOUNTER — Encounter: Payer: Self-pay | Admitting: Internal Medicine

## 2014-12-04 DIAGNOSIS — F17210 Nicotine dependence, cigarettes, uncomplicated: Secondary | ICD-10-CM | POA: Insufficient documentation

## 2014-12-04 DIAGNOSIS — R0789 Other chest pain: Secondary | ICD-10-CM | POA: Insufficient documentation

## 2014-12-04 LAB — CORTISOL-AM, BLOOD: Cortisol - AM: 17.8 ug/dL (ref 4.3–22.4)

## 2014-12-04 NOTE — Assessment & Plan Note (Signed)
>   3 m  I took an extended  opportunity with this patient to outline the consequences of continued cigarette use  in airway disorders based on all the data we have from the multiple national lung health studies (perfomed over decades at millions of dollars in cost)  indicating that smoking cessation, not choice of inhalers or physicians, is the most important aspect of care.   

## 2014-12-04 NOTE — Assessment & Plan Note (Addendum)
I attempted to "connect" with this patient showing her the area on the CT where she has symptoms (which she never really elaborated s using medical terminology that she clearly does not understand) but really got no where and she became frustrated with me and would not let me complete the hx or exam.  After spending 78 m face to face trying to help her she left but did allow me to type out my concerns and recommendations and my nurse reviewed them with her and she had no questions and she would discuss further w/u with her primary care doctor.   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

## 2014-12-06 ENCOUNTER — Ambulatory Visit (INDEPENDENT_AMBULATORY_CARE_PROVIDER_SITE_OTHER): Payer: 59 | Admitting: Emergency Medicine

## 2014-12-06 ENCOUNTER — Telehealth: Payer: Self-pay | Admitting: Family Medicine

## 2014-12-06 ENCOUNTER — Telehealth: Payer: Self-pay | Admitting: Internal Medicine

## 2014-12-06 VITALS — BP 116/74 | HR 102 | Temp 97.5°F | Resp 18 | Ht 67.75 in | Wt 125.0 lb

## 2014-12-06 DIAGNOSIS — R011 Cardiac murmur, unspecified: Secondary | ICD-10-CM | POA: Diagnosis not present

## 2014-12-06 DIAGNOSIS — I493 Ventricular premature depolarization: Secondary | ICD-10-CM

## 2014-12-06 DIAGNOSIS — R042 Hemoptysis: Secondary | ICD-10-CM | POA: Diagnosis not present

## 2014-12-06 DIAGNOSIS — R0602 Shortness of breath: Secondary | ICD-10-CM

## 2014-12-06 NOTE — Telephone Encounter (Signed)
Per Dr. Everlene Farrier call Dr. Annamaria Boots office to see if he can can see her this week. For eval SOB, hemoptysis, r/o bronchiectasis. I called his office and they are sending a message to Dr. Annamaria Boots nurse to see when he can get her in. They will call me back and let me know

## 2014-12-06 NOTE — Telephone Encounter (Signed)
I am pretty fully booked. It would best if we direct her to someone else who can see her sooner.

## 2014-12-06 NOTE — Telephone Encounter (Signed)
Dr. Perfecto Kingdom office called back and said that he would like Dr. Annamaria Boots to see patient earlier than October.  He has requested that Dr. Annamaria Boots call him on his cell phone to discuss: [phone]

## 2014-12-06 NOTE — Telephone Encounter (Signed)
Pt saw Dr. Melvyn Novas on 7.15.16 and is now wanting a second opinion.   Dr. Melvyn Novas please advise if ok.  Dr. Annamaria Boots please advise if ok to take pt.

## 2014-12-06 NOTE — Telephone Encounter (Signed)
Per Dr. Everlene Farrier called Dr. Einar Gip office to see if he could see her today, tomorrow, or some time this week. Spoke to Dr. Einar Gip office and he is out of office until Thursday and requested OV, EKG, imaging faxed over to let NP review so they can schedule her appt. Once those rescords are reviewed she will call me back by the end of the day to let me know day/time. I am faxing over last 2 OV, EKG, and CT chest.

## 2014-12-06 NOTE — Telephone Encounter (Signed)
Fine with me but the best way to w/u for bronchiectasis is High resolution CT chest (s contrast)  Which we can order now or let Dr Perfecto Kingdom office order as I doubt Dr Annamaria Boots has an opening and her CT with contast was cold nl.

## 2014-12-06 NOTE — Progress Notes (Addendum)
This chart was scribed for Arlyss Queen, MD by Thea Alken, ED Scribe. This patient was seen in room 14 and the patient's care was started at 12:37 PM.  Chief Complaint:  Chief Complaint  Patient presents with  . Follow-up    HPI: Terri Wilson is a  female who reports to [facility] today for a follow up. Pt reports symptoms started 2 months ago in early may and was seen by [place] who diagnosed her with the flu. She followed up with Dr. Elder Cyphers a couple days later with a normal CXR and was treated for pneumonia with rocephin. She spoke with Debbie 6 days ago after having rattling, wheezing, and  "popcorn" feeling in her lungs with sharp shooting CP and cough and followed up with Dr. Brigitte Pulse the following day. She had a normal CXR and normal CT of chest. Later that night she had trouble breathing with numbness and tingling to her entire body. She called EMS but refused to be transported to the ED after her labs had come back normal. She was seen by Dr. Brigitte Pulse 3 days ago and was diagnosed with Bronchiectasis. She was referred to pulmonologist and was seen by Dr. Melvyn Novas but would like to have a second opinion. Last night she again developed full body numbness and tingling. She has hx of panic attacks but denies symptoms being similar to panic attack. Pt has stopped smoking again. She takes celexa, adderall , and klonopin as needed for sleep, managed by Dr. Laney Pastor.   Past Medical History  Diagnosis Date  . Anxiety and depression   . Panic attack   . Herpes labialis   . Migraine   . Allergic rhinitis   . Chiari malformation     7 mm  . Depression   . Gestational diabetes mellitus, antepartum   . Gestational diabetes    Past Surgical History  Procedure Laterality Date  . Tonsillectomy and adenoidectomy  2001  . Wisdom tooth extraction     History   Social History  . Marital Status: Married    Spouse Name: [name]  . Number of Children: 0  . Years of Education: 14   Occupational History  .  BUSINESS ASSISTANT [employer]    [employer]   Social History Main Topics  . Smoking status: Former Smoker -- 1.00 packs/day for 10 years    Types: Cigarettes  . Smokeless tobacco: Never Used  . Alcohol Use: No  . Drug Use: No  . Sexual Activity:    Partners: Male   Other Topics Concern  . None   Social History Narrative   Lives with husband.   Caffeine Use: 40mg /day   Family History  Problem Relation Age of Onset  . Asthma Mother     "allergy induced"  . Breast cancer Maternal Grandmother   . Colon cancer Maternal Grandfather   . Hyperlipidemia Other   . Hypertension Other   . Obesity Other   . Heart disease Other   . Von Willebrand disease Sister    Allergies  Allergen Reactions  . Lamictal [Lamotrigine] Anaphylaxis  . Hydrocodone Nausea Only    Tolerates codeine  . Vancomycin Other (See Comments)    Feels hot   Prior to Admission medications   Medication Sig Start Date End Date Taking? Authorizing Provider  albuterol (PROVENTIL HFA;VENTOLIN HFA) 108 (90 BASE) MCG/ACT inhaler Inhale 2 puffs into the lungs every 4 (four) hours as needed for wheezing or shortness of breath (cough, shortness of breath  or wheezing.). 12/03/14  Yes Shawnee Knapp, MD  ALPRAZolam (NIRAVAM) 0.5 MG dissolvable tablet Take 1 tablet (0.5 mg total) by mouth 2 (two) times daily as needed for anxiety. 12/03/14  Yes Shawnee Knapp, MD  amphetamine-dextroamphetamine (ADDERALL) 20 MG tablet 1 at 7am, noon, and 5pm 10/20/14  Yes Araceli Bouche, PA  azithromycin (ZITHROMAX) 500 MG tablet Take 1 tablet (500 mg total) by mouth daily. 12/03/14  Yes Shawnee Knapp, MD  citalopram (CELEXA) 40 MG tablet Take 0.5 tablets (20 mg total) by mouth daily. 07/28/14  Yes Leandrew Koyanagi, MD  clonazePAM (KLONOPIN) 0.5 MG tablet Take 1 tablet (0.5 mg total) by mouth 3 (three) times daily as needed for anxiety. 07/23/14  Yes Leandrew Koyanagi, MD  Dextromethorphan-Guaifenesin [facility] DM MAXIMUM STRENGTH) 60-1200 MG TB12 Take 1 tablet by mouth  every 12 (twelve) hours. 12/03/14  Yes Shawnee Knapp, MD     ROS: The patient denies fevers, chills, night sweats, unintentional weight loss, chest pain, palpitations, wheezing, dyspnea on exertion, nausea, vomiting, abdominal pain, dysuria, hematuria, melena, numbness, weakness, or tingling.   All other systems have been reviewed and were otherwise negative with the exception of those mentioned in the HPI and as above.    PHYSICAL EXAM: Filed Vitals:   12/06/14 1222  BP: 116/74  Pulse: 102  Temp: 97.5 F (36.4 C)  Resp: 18   Body mass index is 19.14 kg/(m^2).   General: Alert, no acute distress HEENT:  Normocephalic, atraumatic, oropharynx patent. [facility]: [facility] Cardiovascular:  Regular rate and rhythm, no rubs  or gallops.  No Carotid bruits, radial pulse intact. No pedal edema. Grade 2 short systolic murmur LSB.  Respiratory: Clear to auscultation bilaterally.  No wheezes, rales, or rhonchi.  No cyanosis, no use of accessory musculature Abdominal: No organomegaly, abdomen is soft and non-tender, positive bowel sounds.  No masses. Musculoskeletal: Gait intact. No edema, tenderness Skin: No rashes. Neurologic: Facial musculature symmetric. Psychiatric: Patient acts appropriately throughout our interaction. Lymphatic: No cervical or submandibular lymphadenopathy Genitourinary/Anorectal: No acute findings    LABS: Results for orders placed or performed in visit on 12/03/14  Cortisol-am, blood  Result Value Ref Range   Cortisol - AM 17.8 4.3 - 22.4 ug/dL    EKG/XRAY:   Primary read interpreted by Dr. Everlene Farrier at [facility]. No acute changes. PVC's noted. QT normal   ASSESSMENT/PLAN: 1. PVC (premature ventricular contraction)  - EKG 12-Lead - Ambulatory referral to Cardiology  2. Heart murmur This is a short grade 2 systolic murmur at the left sternal border. - Ambulatory referral to Cardiology  3. SOB (shortness of breath) Patient went to see Dr. Melvyn Novas. He was concerned  about possible bronchiectasis. He recommended further evaluation but apparently the patient was upset and was not able to communicate well with Dr. Melvyn Novas. She is asking for a second opinion. - Ambulatory referral to Pulmonology  4. Hemoptysis This occurred after a long coughing spell. She is a smoker but has stopped. Hopefully we will proceed with further pulmonary evaluation by fine cut CT and possible bronchoscopy if necessary. - Ambulatory referral to Pulmonology 5. Anxiety Some of her symptoms sound like panic attacks. Apparently after she has her coughing spells she has shortness of breath and has had syncope and near syncope with these episodes. She does have PVCs on baseline EKG. Hopefully cardiology can give [place] some guidance on this. I personally performed the services described in this documentation, which was scribed in my presence. The  recorded information has been reviewed and is accurate.  Arlyss Queen, MD  [facility]  12/06/2014 1:51 PM   Gross sideeffects, risk and benefits, and alternatives of medications d/w patient. Patient is aware that all medications have potential sideeffects and we are unable to predict every sideeffect or drug-drug interaction that may occur.  Arlyss Queen MD 12/06/2014 12:29 PM

## 2014-12-06 NOTE — Telephone Encounter (Signed)
Called and spoke with Ebony Hail at Dr. Perfecto Kingdom office.  Dr. Everlene Farrier is requesting referral for patient to see Dr. Annamaria Boots to rule out Bronchiectasis.   SOB, coughing up blood, quit smoking a week ago.  Near Syncope episodes.  Dr. Annamaria Boots are you willing to take this patient on?    Please advise.

## 2014-12-07 NOTE — Telephone Encounter (Signed)
LMOM at Dr Perfecto Kingdom office to call back regarding 2nd opinion referral.

## 2014-12-07 NOTE — Telephone Encounter (Signed)
Called spoke with Junie Panning from [facility]. She saw CDY note and reports Dr. Everlene Farrier will figure something out. Will sign off message for now.

## 2014-12-07 NOTE — Telephone Encounter (Signed)
Erin from [facility] at [phone] is returning Michelle's call

## 2014-12-08 ENCOUNTER — Other Ambulatory Visit: Payer: Self-pay | Admitting: Emergency Medicine

## 2014-12-08 ENCOUNTER — Telehealth: Payer: Self-pay | Admitting: Internal Medicine

## 2014-12-08 DIAGNOSIS — R0602 Shortness of breath: Secondary | ICD-10-CM

## 2014-12-08 NOTE — Telephone Encounter (Signed)
Pl refer to other MDs who have availability

## 2014-12-08 NOTE — Telephone Encounter (Signed)
Per 12/06/14 phone note:  Glean Hess, CMA at 12/06/2014 2:54 PM     Status: Signed       Expand All Collapse All   Called and spoke with Ebony Hail at Dr. Perfecto Kingdom office. Dr. Everlene Farrier is requesting referral for patient to see Dr. Annamaria Boots to rule out Bronchiectasis.  SOB, coughing up blood, quit smoking a week ago. Near Syncope episodes.       --  Dr. Annamaria Boots did not have any openings and reports it would be best to work pt in to see someone else.  MW okay'd for pt to switch. Please advise RA thanks

## 2014-12-09 NOTE — Telephone Encounter (Signed)
Dr. Ashok Cordia has the earliest availability.  Dr. Ashok Cordia are you ok with taking on this patient?  Thanks!

## 2014-12-10 ENCOUNTER — Telehealth: Payer: Self-pay

## 2014-12-10 NOTE — Telephone Encounter (Signed)
[facility] called requesting patient's information to register pt for appt. Made appt for pt 12/13/14 at 9:00am with Dr. Romero Belling. It is at [facility] on the 7th floor in J wing.  Pt is aware.

## 2014-12-13 DIAGNOSIS — R911 Solitary pulmonary nodule: Secondary | ICD-10-CM | POA: Insufficient documentation

## 2014-12-13 NOTE — Telephone Encounter (Signed)
Dr. Everlene Farrier, Sharyn Lull will call [facility] and try to schedule an appt with Dr. Blanchard Mane

## 2014-12-13 NOTE — Telephone Encounter (Signed)
Go ahead and schedule the patient to see me but if she is having significant hemoptysis she should be seen in the E.D.

## 2014-12-13 NOTE — Telephone Encounter (Signed)
Called Erin at [facility], advised her that we are going to set patient up with Dr. Ashok Cordia, however,  Patient has decided to see Dr. Mariana Arn at [facility].   Nothing further needed.

## 2014-12-13 NOTE — Telephone Encounter (Signed)
Dr. Ashok Cordia, ok to schedule patient? Please advise.

## 2014-12-17 ENCOUNTER — Encounter: Payer: Self-pay | Admitting: Emergency Medicine

## 2014-12-18 ENCOUNTER — Emergency Department (HOSPITAL_COMMUNITY)
Admission: EM | Admit: 2014-12-18 | Discharge: 2014-12-18 | Disposition: A | Discharge status: Home / Self Care | Payer: 59 | Attending: Emergency Medicine | Admitting: Emergency Medicine

## 2014-12-18 ENCOUNTER — Emergency Department (HOSPITAL_COMMUNITY)
Admission: EM | Admit: 2014-12-18 | Discharge: 2014-12-18 | Disposition: A | Discharge status: Nursing Home (Includes ICF) | Payer: 59 | Source: Home / Self Care | Attending: Family Medicine | Admitting: Family Medicine

## 2014-12-18 ENCOUNTER — Emergency Department (HOSPITAL_COMMUNITY): Payer: 59

## 2014-12-18 ENCOUNTER — Encounter (HOSPITAL_COMMUNITY): Payer: Self-pay | Admitting: *Deleted

## 2014-12-18 DIAGNOSIS — F329 Major depressive disorder, single episode, unspecified: Secondary | ICD-10-CM | POA: Diagnosis not present

## 2014-12-18 DIAGNOSIS — Z87891 Personal history of nicotine dependence: Secondary | ICD-10-CM | POA: Insufficient documentation

## 2014-12-18 DIAGNOSIS — Z79899 Other long term (current) drug therapy: Secondary | ICD-10-CM | POA: Diagnosis not present

## 2014-12-18 DIAGNOSIS — Z3202 Encounter for pregnancy test, result negative: Secondary | ICD-10-CM | POA: Insufficient documentation

## 2014-12-18 DIAGNOSIS — F410 Panic disorder [episodic paroxysmal anxiety] without agoraphobia: Secondary | ICD-10-CM | POA: Insufficient documentation

## 2014-12-18 DIAGNOSIS — Z8632 Personal history of gestational diabetes: Secondary | ICD-10-CM | POA: Insufficient documentation

## 2014-12-18 DIAGNOSIS — M791 Myalgia: Secondary | ICD-10-CM | POA: Diagnosis not present

## 2014-12-18 DIAGNOSIS — R000 Tachycardia, unspecified: Secondary | ICD-10-CM | POA: Diagnosis not present

## 2014-12-18 DIAGNOSIS — Z8619 Personal history of other infectious and parasitic diseases: Secondary | ICD-10-CM | POA: Insufficient documentation

## 2014-12-18 DIAGNOSIS — K529 Noninfective gastroenteritis and colitis, unspecified: Secondary | ICD-10-CM | POA: Diagnosis not present

## 2014-12-18 DIAGNOSIS — R197 Diarrhea, unspecified: Secondary | ICD-10-CM

## 2014-12-18 DIAGNOSIS — R112 Nausea with vomiting, unspecified: Secondary | ICD-10-CM | POA: Diagnosis present

## 2014-12-18 DIAGNOSIS — Q0700 Arnold-Chiari syndrome without spina bifida or hydrocephalus: Secondary | ICD-10-CM | POA: Diagnosis not present

## 2014-12-18 LAB — COMPREHENSIVE METABOLIC PANEL
ALT: 10 U/L — ABNORMAL LOW (ref 14–54)
AST: 14 U/L — ABNORMAL LOW (ref 15–41)
Albumin: 3 g/dL — ABNORMAL LOW (ref 3.5–5.0)
Alkaline Phosphatase: 56 U/L (ref 38–126)
Anion gap: 8 (ref 5–15)
BUN: 11 mg/dL (ref 6–20)
CO2: 21 mmol/L — ABNORMAL LOW (ref 22–32)
Calcium: 7.7 mg/dL — ABNORMAL LOW (ref 8.9–10.3)
Chloride: 110 mmol/L (ref 101–111)
Creatinine, Ser: 0.63 mg/dL (ref 0.44–1.00)
GFR calc Af Amer: >60 mL/min (ref >60)
GFR calc non Af Amer: >60 mL/min (ref >60)
Glucose, Bld: 108 mg/dL — ABNORMAL HIGH (ref 65–99)
Potassium: 4 mmol/L (ref 3.5–5.1)
Sodium: 139 mmol/L (ref 135–145)
Total Bilirubin: 0.8 mg/dL (ref 0.3–1.2)
Total Protein: 5 g/dL — ABNORMAL LOW (ref 6.5–8.1)

## 2014-12-18 LAB — CBC WITH DIFFERENTIAL/PLATELET
Basophils Absolute: 0 10*3/uL (ref 0.0–0.1)
Basophils Relative: 0 % (ref 0–1)
Eosinophils Absolute: 0 10*3/uL (ref 0.0–0.7)
Eosinophils Relative: 1 % (ref 0–5)
HCT: 36.9 % (ref 36.0–46.0)
Hemoglobin: 12.3 g/dL (ref 12.0–15.0)
Lymphocytes Relative: 6 % — ABNORMAL LOW (ref 12–46)
Lymphs Abs: 0.6 10*3/uL — ABNORMAL LOW (ref 0.7–4.0)
MCH: 29 pg (ref 26.0–34.0)
MCHC: 33.3 g/dL (ref 30.0–36.0)
MCV: 87 fL (ref 78.0–100.0)
Monocytes Absolute: 0.4 10*3/uL (ref 0.1–1.0)
Monocytes Relative: 4 % (ref 3–12)
Neutro Abs: 7.8 10*3/uL — ABNORMAL HIGH (ref 1.7–7.7)
Neutrophils Relative %: 89 % — ABNORMAL HIGH (ref 43–77)
Platelets: 133 10*3/uL — ABNORMAL LOW (ref 150–400)
RBC: 4.24 MIL/uL (ref 3.87–5.11)
RDW: 13.1 % (ref 11.5–15.5)
WBC: 8.8 10*3/uL (ref 4.0–10.5)

## 2014-12-18 LAB — URINALYSIS, ROUTINE W REFLEX MICROSCOPIC
Bilirubin Urine: NEGATIVE
Glucose, UA: NEGATIVE mg/dL
Hgb urine dipstick: NEGATIVE
Ketones, ur: NEGATIVE mg/dL
Leukocytes, UA: NEGATIVE
Nitrite: NEGATIVE
Protein, ur: NEGATIVE mg/dL
Specific Gravity, Urine: 1.024 (ref 1.005–1.030)
Urobilinogen, UA: 0.2 mg/dL (ref 0.0–1.0)
pH: 5.5 (ref 5.0–8.0)

## 2014-12-18 LAB — I-STAT BETA HCG BLOOD, ED (MC, WL, AP ONLY): I-stat hCG, quantitative: <5 m[IU]/mL (ref <5)

## 2014-12-18 LAB — POC URINE PREG, ED: Preg Test, Ur: NEGATIVE

## 2014-12-18 LAB — I-STAT CG4 LACTIC ACID, ED: Lactic Acid, Venous: 0.7 mmol/L (ref 0.5–2.0)

## 2014-12-18 LAB — LIPASE, BLOOD: Lipase: 19 U/L — ABNORMAL LOW (ref 22–51)

## 2014-12-18 MED ORDER — ONDANSETRON 8 MG PO TBDP: ORAL_TABLET | ORAL | Status: DC

## 2014-12-18 MED ORDER — SODIUM CHLORIDE 0.9 % IV SOLN
1000.0000 mL | INTRAVENOUS | Status: DC
  Administered 2014-12-18: 1000 mL via INTRAVENOUS

## 2014-12-18 MED ORDER — CEFTRIAXONE SODIUM 250 MG IJ SOLR
INTRAMUSCULAR | Status: AC
  Filled 2014-12-18: qty 250

## 2014-12-18 MED ORDER — SODIUM CHLORIDE 0.9 % IV SOLN
1000.0000 mL | Freq: Once | INTRAVENOUS | Status: AC
  Administered 2014-12-18: 1000 mL via INTRAVENOUS

## 2014-12-18 MED ORDER — LORAZEPAM 2 MG/ML IJ SOLN
0.5000 mg | Freq: Once | INTRAMUSCULAR | Status: AC
  Administered 2014-12-18: 0.5 mg via INTRAVENOUS
  Filled 2014-12-18: qty 1

## 2014-12-18 MED ORDER — MORPHINE SULFATE 4 MG/ML IJ SOLN
2.0000 mg | Freq: Once | INTRAMUSCULAR | Status: AC
  Administered 2014-12-18: 2 mg via INTRAVENOUS
  Filled 2014-12-18: qty 1

## 2014-12-18 MED ORDER — ONDANSETRON HCL 4 MG/2ML IJ SOLN
4.0000 mg | Freq: Once | INTRAMUSCULAR | Status: AC
  Administered 2014-12-18: 4 mg via INTRAVENOUS

## 2014-12-18 MED ORDER — PROCHLORPERAZINE EDISYLATE 5 MG/ML IJ SOLN
10.0000 mg | Freq: Once | INTRAMUSCULAR | Status: AC
  Administered 2014-12-18: 10 mg via INTRAVENOUS
  Filled 2014-12-18: qty 2

## 2014-12-18 MED ORDER — SODIUM CHLORIDE 0.9 % IV BOLUS (SEPSIS)
1000.0000 mL | Freq: Once | INTRAVENOUS | Status: AC
  Administered 2014-12-18: 1000 mL via INTRAVENOUS

## 2014-12-18 MED ORDER — AZITHROMYCIN 250 MG PO TABS
ORAL_TABLET | ORAL | Status: AC
  Filled 2014-12-18: qty 4

## 2014-12-18 MED ORDER — ONDANSETRON HCL 4 MG/2ML IJ SOLN
INTRAMUSCULAR | Status: AC
  Filled 2014-12-18: qty 2

## 2014-12-18 NOTE — ED Provider Notes (Signed)
CSN: [id number]     Arrival date & time 12/18/14  1714 History   First MD Initiated Contact with Patient 12/18/14 1716     Chief Complaint  Patient presents with  . GI Problem     (Consider location/radiation/quality/duration/timing/severity/associated sxs/prior Treatment) The history is provided by the patient, a relative and medical records. No language interpreter was used.     Terri Wilson is a 29 y.o. female  with a hx of anxiety, depression, migraine headache,  presents to the Emergency Department complaining of gradual, persistent, progressively worsening nausea and vomiting onset 24 hours ago. Associated symptoms include mild, generalized abdominal soreness; myalgias, fever and chills.  Pt has taken Phenergan by mouth at home without relief.  No hematemesis, hematochezia or melena.  No alleviating factors. Patient reports anytime she eats or drinks something it causes her to have diarrhea.  Pt denies chest pain, shortness of breath, syncope, dysuria, hematuria.  LMP: 12/10/14  Patient denies sick contacts, recent travel, tick bites, camping.  She did have a Z-Pak 3 days approximately 2 weeks ago but denies other antibiotics.  Patient denies history of abdominal surgery.   Past Medical History  Diagnosis Date  . Anxiety and depression   . Panic attack   . Herpes labialis   . Migraine   . Allergic rhinitis   . Chiari malformation     7 mm  . Depression   . Gestational diabetes mellitus, antepartum   . Gestational diabetes    Past Surgical History  Procedure Laterality Date  . Tonsillectomy and adenoidectomy  2001  . Wisdom tooth extraction     Family History  Problem Relation Age of Onset  . Asthma Mother     "allergy induced"  . Breast cancer Maternal Grandmother   . Colon cancer Maternal Grandfather   . Hyperlipidemia Other   . Hypertension Other   . Obesity Other   . Heart disease Other   . Von Willebrand disease Sister    History  Substance Use Topics  .  Smoking status: Former Smoker -- 1.00 packs/day for 10 years    Types: Cigarettes  . Smokeless tobacco: Never Used  . Alcohol Use: No   OB History    Gravida Para Term Preterm AB TAB SAB Ectopic Multiple Living   1 1 1  0 0 0 0 0 0 1     Review of Systems  Constitutional: Positive for fever. Negative for diaphoresis, appetite change, fatigue and unexpected weight change.  HENT: Negative for mouth sores.   Eyes: Negative for visual disturbance.  Respiratory: Negative for cough, chest tightness, shortness of breath and wheezing.   Cardiovascular: Negative for chest pain.  Gastrointestinal: Positive for nausea, vomiting, abdominal pain and diarrhea. Negative for constipation.  Endocrine: Negative for polydipsia, polyphagia and polyuria.  Genitourinary: Negative for dysuria, urgency, frequency and hematuria.  Musculoskeletal: Positive for myalgias. Negative for back pain and neck stiffness.  Skin: Negative for rash.  Allergic/Immunologic: Negative for immunocompromised state.  Neurological: Negative for syncope, light-headedness and headaches.  Hematological: Does not bruise/bleed easily.  Psychiatric/Behavioral: Negative for sleep disturbance. The patient is not nervous/anxious.       Allergies  Lamictal; Hydrocodone; and Vancomycin  Home Medications   Prior to Admission medications   Medication Sig Start Date End Date Taking? Authorizing Provider  albuterol (PROVENTIL HFA;VENTOLIN HFA) 108 (90 BASE) MCG/ACT inhaler Inhale 2 puffs into the lungs every 4 (four) hours as needed for wheezing or shortness of breath (cough, shortness of  breath or wheezing.). Patient taking differently: Inhale 2 puffs into the lungs every 4 (four) hours as needed for wheezing or shortness of breath (cough).  12/03/14  Yes Shawnee Knapp, MD  amphetamine-dextroamphetamine (ADDERALL) 20 MG tablet 1 at 7am, noon, and 5pm Patient taking differently: Take 20 mg by mouth 2 (two) times daily. Take 1 tablet (20 mg)  every morning and 1 tablet (20 mg) after lunch 10/20/14  Yes Todd McVeigh, PA  citalopram (CELEXA) 40 MG tablet Take 0.5 tablets (20 mg total) by mouth daily. 07/28/14  Yes Leandrew Koyanagi, MD  clonazePAM (KLONOPIN) 0.5 MG tablet Take 1 tablet (0.5 mg total) by mouth 3 (three) times daily as needed for anxiety. Patient taking differently: Take 0.5 mg by mouth daily as needed for anxiety.  07/23/14  Yes Leandrew Koyanagi, MD  Multiple Vitamin (MULTIVITAMIN WITH MINERALS) TABS tablet Take 1 tablet by mouth daily.   Yes Historical Provider, MD  ondansetron (ZOFRAN) 4 MG tablet Take 4 mg by mouth every 6 (six) hours as needed for nausea or vomiting.   Yes Historical Provider, MD  ALPRAZolam (NIRAVAM) 0.5 MG dissolvable tablet Take 1 tablet (0.5 mg total) by mouth 2 (two) times daily as needed for anxiety. Patient not taking: Reported on 12/18/2014 12/03/14   Shawnee Knapp, MD  Dextromethorphan-Guaifenesin [facility] DM MAXIMUM STRENGTH) 60-1200 MG TB12 Take 1 tablet by mouth every 12 (twelve) hours. Patient not taking: Reported on 12/18/2014 12/03/14   Shawnee Knapp, MD  ondansetron (ZOFRAN ODT) 8 MG disintegrating tablet 8mg  ODT q4 hours prn nausea 12/18/14   Jarrett Soho Josey Dettmann, PA-C   BP 96/46 mmHg  Pulse 108  Temp(Src) 99.7 F (37.6 C) (Oral)  Resp 18  Ht 5\' 7"  (1.702 m)  Wt 125 lb (56.7 kg)  BMI 19.57 kg/m2  SpO2 98%  LMP 12/13/2014 Physical Exam  Constitutional: She appears well-developed and well-nourished. No distress.  Awake, alert, nontoxic appearance  HENT:  Head: Normocephalic and atraumatic.  Mouth/Throat: Oropharynx is clear and moist. No oropharyngeal exudate.  Eyes: Conjunctivae are normal. No scleral icterus.  Neck: Normal range of motion. Neck supple.  Cardiovascular: Regular rhythm, normal heart sounds and intact distal pulses.  Tachycardia present.   No murmur heard. Pulses:      Radial pulses are 2+ on the right side, and 2+ on the left side.       Dorsalis pedis pulses are 2+ on  the right side, and 2+ on the left side.  Pulmonary/Chest: Effort normal and breath sounds normal. No respiratory distress. She has no wheezes.  Equal chest expansion  Abdominal: Soft. Bowel sounds are normal. She exhibits no distension and no mass. There is generalized tenderness. There is no rebound, no guarding and no CVA tenderness.  Mild, generalized tenderness without rebound or guarding  Musculoskeletal: Normal range of motion. She exhibits no edema.  Lymphadenopathy:    She has no cervical adenopathy.  Neurological: She is alert.  Speech is clear and goal oriented Moves extremities without ataxia  Skin: Skin is warm and dry. No rash noted. She is not diaphoretic. No erythema.  No rash  Psychiatric: Her mood appears anxious.  Nursing note and vitals reviewed.   ED Course  Procedures (including critical care time) Labs Review Labs Reviewed  CBC WITH DIFFERENTIAL/PLATELET - Abnormal; Notable for the following:    Platelets 133 (*)    Neutrophils Relative % 89 (*)    Neutro Abs 7.8 (*)    Lymphocytes Relative 6 (*)  Lymphs Abs 0.6 (*)    All other components within normal limits  COMPREHENSIVE METABOLIC PANEL - Abnormal; Notable for the following:    CO2 21 (*)    Glucose, Bld 108 (*)    Calcium 7.7 (*)    Total Protein 5.0 (*)    Albumin 3.0 (*)    AST 14 (*)    ALT 10 (*)    All other components within normal limits  LIPASE, BLOOD - Abnormal; Notable for the following:    Lipase 19 (*)    All other components within normal limits  OVA AND PARASITE EXAMINATION  URINALYSIS, ROUTINE W REFLEX MICROSCOPIC (NOT AT [facility])  GI PATHOGEN PANEL BY PCR, STOOL  I-STAT CG4 LACTIC ACID, ED  POC URINE PREG, ED  I-STAT BETA HCG BLOOD, ED ([facility], [facility], [facility] ONLY)    Imaging Review Dg Abd Acute W/chest  12/18/2014   CLINICAL DATA:  Emesis and diarrhea for 24 hr  EXAM: DG ABDOMEN ACUTE W/ 1V CHEST  COMPARISON:  None.  FINDINGS: There is no evidence of dilated bowel loops or free  intraperitoneal air. No radiopaque calculi or other significant radiographic abnormality is seen. Heart size and mediastinal contours are within normal limits. Both lungs are clear.  IMPRESSION: Negative abdominal radiographs.  No acute cardiopulmonary disease.   Electronically Signed   By: Andreas Newport M.D.   On: 12/18/2014 20:06     EKG Interpretation None      MDM   Final diagnoses:  Gastroenteritis  Non-intractable vomiting with nausea, vomiting of unspecified type  Diarrhea   Terri Wilson presents emergency department with nausea, vomiting, diarrhea and generalized abdominal soreness with associated myalgias.  Patient has been sick for 24 hours with the symptoms. No hematemesis, melanoma or hematochezia. Abdomen is generally tender without rebound or guarding. No peritoneal signs. No focal abdominal pain. Will obtain labs, give fluids and reassess. Patient with borderline hypotension in the 70Y systolic here.  Orthostatic VS for the past 24 hrs:  BP- Lying Pulse- Lying BP- Sitting Pulse- Sitting BP- Standing at 0 minutes Pulse- Standing at 0 minutes  12/18/14 1921 102/63 mmHg 93 105/70 mmHg 99 101/68 mmHg 126    9:00PM Labs are reassuring. No evidence of UTI, pancreatitis. Acute abdomen without evidence of a bowel obstruction.  No diarrhea here. No emesis here. Will by mouth trial and reassess. Patient continues to be slightly tachycardic even after fluid boluses to a total of 3.5 L.  10:46 PM Patient is tolerating by mouth at this time without vomiting or diarrhea.  Her labs are reassuring and multiple repeat abdominal exams show mild and generalized tenderness without focal pain, rebound or guarding.  Patient continues to appear ill in spite of her normal labs. I discussed my concern about her low BP and offered admission, but pt declines.    The patient was discussed with and seen by Dr. Doy Mince who also offered admission, but pt continues to refuse.  She reports she  will follow with her PCP tomorrow.  Strict return precautions discussed.  Patient symptoms are consistent with viral gastroenteritis.  Lungs are clear.  No focal abdominal pain, no concern for appendicitis, cholecystitis, pancreatitis, ruptured viscus, UTI, kidney stone, or any other abdominal etiology.    BP 96/46 mmHg  Pulse 108  Temp(Src) 99.7 F (37.6 C) (Oral)  Resp 18  Ht 5\' 7"  (1.702 m)  Wt 125 lb (56.7 kg)  BMI 19.57 kg/m2  SpO2 98%  LMP 12/13/2014  Jarrett Soho Janis Cuffe, PA-C 12/18/14 [place], MD 12/19/14 Dyann Kief

## 2014-12-18 NOTE — ED Notes (Signed)
Ginger ale and crackers given to pt.

## 2014-12-18 NOTE — ED Notes (Signed)
Pt was able to keep down food/drink given. EDP notified.

## 2014-12-18 NOTE — ED Notes (Signed)
Pt back from x-ray.

## 2014-12-18 NOTE — ED Notes (Signed)
Pt presents from UC for nausea/emesis/diarrhea x 24 hours.  Pt received 4mg  Zofran, 1000cc NS.  Pt a x 4, NAD.  Reports 6/10 abdominal pain.  Reports taking zithromax x 2 weeks ago.  Denies fever.

## 2014-12-18 NOTE — ED Notes (Signed)
Pt   Reports     Symptoms      Of       Nausea      And   Vomiting       As   Well  As  Diarrhea     X   With     Symptoms  X  24 hours    The  Pt    Reports  Symptoms not  releived  By  zofran    The  Pt     Reports         Mouth  Feels  Dry     And  Pt feels  Weak            The  Patients  Mother is  At bedside

## 2014-12-18 NOTE — ED Provider Notes (Addendum)
CSN: [id number]     Arrival date & time 12/18/14  1537 History   First MD Initiated Contact with Terri Wilson 12/18/14 1551     Chief Complaint  Terri Wilson presents with  . Emesis   (Consider location/radiation/quality/duration/timing/severity/associated sxs/prior Treatment) Terri Wilson is a 29 y.o. female presenting with vomiting. The history is provided by the Terri Wilson and a parent.  Emesis Severity:  Moderate Duration:  1 day Number of daily episodes:  3 Quality:  Stomach contents Progression:  Unchanged (no response to zofran or phenergan.) Chronicity:  New Recent urination:  Decreased Ineffective treatments:  Antiemetics Associated symptoms: abdominal pain, chills, diarrhea and fever   Risk factors: no sick contacts and no suspect food intake     Past Medical History  Diagnosis Date  . Anxiety and depression   . Panic attack   . Herpes labialis   . Migraine   . Allergic rhinitis   . Chiari malformation     7 mm  . Depression   . Gestational diabetes mellitus, antepartum   . Gestational diabetes    Past Surgical History  Procedure Laterality Date  . Tonsillectomy and adenoidectomy  2001  . Wisdom tooth extraction     Family History  Problem Relation Age of Onset  . Asthma Mother     "allergy induced"  . Breast cancer Maternal Grandmother   . Colon cancer Maternal Grandfather   . Hyperlipidemia Other   . Hypertension Other   . Obesity Other   . Heart disease Other   . Von Willebrand disease Sister    History  Substance Use Topics  . Smoking status: Former Smoker -- 1.00 packs/day for 10 years    Types: Cigarettes  . Smokeless tobacco: Never Used  . Alcohol Use: No   OB History    Gravida Para Term Preterm AB TAB SAB Ectopic Multiple Living   1 1 1  0 0 0 0 0 0 1     Review of Systems  Constitutional: Positive for chills.  HENT: Negative.   Gastrointestinal: Positive for nausea, vomiting, abdominal pain and diarrhea. Negative for blood in stool.  Genitourinary:  Negative.     Allergies  Lamictal; Hydrocodone; and Vancomycin  Home Medications   Prior to Admission medications   Medication Sig Start Date End Date Taking? Authorizing Provider  albuterol (PROVENTIL HFA;VENTOLIN HFA) 108 (90 BASE) MCG/ACT inhaler Inhale 2 puffs into the lungs every 4 (four) hours as needed for wheezing or shortness of breath (cough, shortness of breath or wheezing.). 12/03/14   Shawnee Knapp, MD  ALPRAZolam (NIRAVAM) 0.5 MG dissolvable tablet Take 1 tablet (0.5 mg total) by mouth 2 (two) times daily as needed for anxiety. 12/03/14   Shawnee Knapp, MD  amphetamine-dextroamphetamine (ADDERALL) 20 MG tablet 1 at 7am, noon, and 5pm 10/20/14   Araceli Bouche, PA  azithromycin (ZITHROMAX) 500 MG tablet Take 1 tablet (500 mg total) by mouth daily. 12/03/14   Shawnee Knapp, MD  citalopram (CELEXA) 40 MG tablet Take 0.5 tablets (20 mg total) by mouth daily. 07/28/14   Leandrew Koyanagi, MD  clonazePAM (KLONOPIN) 0.5 MG tablet Take 1 tablet (0.5 mg total) by mouth 3 (three) times daily as needed for anxiety. 07/23/14   Leandrew Koyanagi, MD  Dextromethorphan-Guaifenesin (MUCINEX DM MAXIMUM STRENGTH) 60-1200 MG TB12 Take 1 tablet by mouth every 12 (twelve) hours. 12/03/14   Shawnee Knapp, MD   BP 102/72 mmHg  Pulse 121  Temp(Src) 98.6 F (37 C) (Oral)  Resp  18  SpO2 100%  LMP 10/30/2014 Physical Exam  Constitutional: She is oriented to person, place, and time. She appears well-developed and well-nourished. She appears distressed.  HENT:  Mouth/Throat: Mucous membranes are dry.  Cardiovascular: Normal heart sounds.   Pulmonary/Chest: Effort normal and breath sounds normal.  Abdominal: Soft. She exhibits no distension and no mass. Bowel sounds are decreased. There is no hepatosplenomegaly. There is generalized tenderness. There is no rigidity, no rebound, no guarding and no CVA tenderness.  Neurological: She is alert and oriented to person, place, and time.  Skin: Skin is warm. She is diaphoretic.   Nursing note and vitals reviewed.   ED Course  Procedures (including critical care time) Labs Review Labs Reviewed - No data to display  Imaging Review No results found.   MDM   1. Gastroenteritis, acute   sent for continued ivf and meds for acute gastro.    Billy Fischer, MD 12/18/14 [place], MD 12/18/14 [phone]

## 2014-12-18 NOTE — Discharge Instructions (Signed)
1. Medications: Zofran for vomiting, Imodium for diarrhea, usual home medications 2. Treatment: rest, drink plenty of fluids, advance diet slowly 3. Follow Up: Please followup with your primary doctor in TOMORROW for re-evaluation; Please return to the ER for worsening symptoms   Norovirus Infection Norovirus illness is caused by a viral infection. The term norovirus refers to a group of viruses. Any of those viruses can cause norovirus illness. This illness is often referred to by other names such as viral gastroenteritis, stomach flu, and food poisoning. Anyone can get a norovirus infection. People can have the illness multiple times during their lifetime. CAUSES  Norovirus is found in the stool or vomit of infected people. It is easily spread from person to person (contagious). People with norovirus are contagious from the moment they begin feeling ill. They may remain contagious for as long as 3 days to 2 weeks after recovery. People can become infected with the virus in several ways. This includes:  Eating food or drinking liquids that are contaminated with norovirus.  Touching surfaces or objects contaminated with norovirus, and then placing your hand in your mouth.  Having direct contact with a person who is infected and shows symptoms. This may occur while caring for someone with illness or while sharing foods or eating utensils with someone who is ill. SYMPTOMS  Symptoms usually begin 1 to 2 days after ingestion of the virus. Symptoms may include:  Nausea.  Vomiting.  Diarrhea.  Stomach cramps.  Low-grade fever.  Chills.  Headache.  Muscle aches.  Tiredness. Most people with norovirus illness get better within 1 to 2 days. Some people become dehydrated because they cannot drink enough liquids to replace those lost from vomiting and diarrhea. This is especially true for young children, the elderly, and others who are unable to care for themselves. DIAGNOSIS  Diagnosis is  based on your symptoms and exam. Currently, only state public health laboratories have the ability to test for norovirus in stool or vomit. TREATMENT  No specific treatment exists for norovirus infections. No vaccine is available to prevent infections. Norovirus illness is usually brief in healthy people. If you are ill with vomiting and diarrhea, you should drink enough water and fluids to keep your urine clear or pale yellow. Dehydration is the most serious health effect that can result from this infection. By drinking oral rehydration solution (ORS), people can reduce their chance of becoming dehydrated. There are many commercially available pre-made and powdered ORS designed to safely rehydrate people. These may be recommended by your caregiver. Replace any new fluid losses from diarrhea or vomiting with ORS as follows:  If your child weighs 10 kg or less (22 lb or less), give 60 to 120 ml ( to  cup or 2 to 4 oz) of ORS for each diarrheal stool or vomiting episode.  If your child weighs more than 10 kg (more than 22 lb), give 120 to 240 ml ( to 1 cup or 4 to 8 oz) of ORS for each diarrheal stool or vomiting episode. HOME CARE INSTRUCTIONS   Follow all your caregiver's instructions.  Avoid sugar-free and alcoholic drinks while ill.  Only take over-the-counter or prescription medicines for pain, vomiting, diarrhea, or fever as directed by your caregiver. You can decrease your chances of coming in contact with norovirus or spreading it by following these steps:  Frequently wash your hands, especially after using the toilet, changing diapers, and before eating or preparing food.  Carefully wash fruits and vegetables. Lacinda Axon  shellfish before eating them.  Do not prepare food for others while you are infected and for at least 3 days after recovering from illness.  Thoroughly clean and disinfect contaminated surfaces immediately after an episode of illness using a bleach-based household  cleaner.  Immediately remove and wash clothing or linens that may be contaminated with the virus.  Use the toilet to dispose of any vomit or stool. Make sure the surrounding area is kept clean.  Food that may have been contaminated by an ill person should be discarded. SEEK IMMEDIATE MEDICAL CARE IF:   You develop symptoms of dehydration that do not improve with fluid replacement. This may include:  Excessive sleepiness.  Lack of tears.  Dry mouth.  Dizziness when standing.  Weak pulse. Document Released: 07/28/2002 Document Revised: 07/30/2011 Document Reviewed: 08/29/2009 [facility] Patient Information 2015 [place], [place]. This information is not intended to replace advice given to you by your health care provider. Make sure you discuss any questions you have with your health care provider.

## 2014-12-18 NOTE — ED Notes (Addendum)
PA at bedside and aware of BP

## 2014-12-18 NOTE — ED Notes (Signed)
EDP at bedside  

## 2014-12-31 ENCOUNTER — Telehealth: Payer: Self-pay

## 2014-12-31 MED ORDER — AMPHETAMINE-DEXTROAMPHETAMINE 20 MG PO TABS: 20.0000 mg | ORAL_TABLET | Freq: Two times a day (BID) | ORAL | Status: DC

## 2014-12-31 NOTE — Telephone Encounter (Signed)
Meds ordered this encounter  Medications  . amphetamine-dextroamphetamine (ADDERALL) 20 MG tablet    Sig: Take 1 tablet (20 mg total) by mouth 2 (two) times daily. Take 1 tablet (20 mg) every morning and 1 tablet (20 mg) after lunch    Dispense:  60 tablet    Refill:  0

## 2014-12-31 NOTE — Telephone Encounter (Signed)
Pt.notified

## 2014-12-31 NOTE — Telephone Encounter (Signed)
Pt is requesting a refill of adderall. °

## 2015-02-15 ENCOUNTER — Ambulatory Visit (INDEPENDENT_AMBULATORY_CARE_PROVIDER_SITE_OTHER): Payer: 59 | Admitting: Emergency Medicine

## 2015-02-15 ENCOUNTER — Ambulatory Visit: Payer: 59 | Admitting: Family Medicine

## 2015-02-15 VITALS — BP 102/66 | HR 86 | Temp 98.5°F | Resp 18 | Wt 127.0 lb

## 2015-02-15 DIAGNOSIS — F909 Attention-deficit hyperactivity disorder, unspecified type: Secondary | ICD-10-CM | POA: Diagnosis not present

## 2015-02-15 DIAGNOSIS — F988 Other specified behavioral and emotional disorders with onset usually occurring in childhood and adolescence: Secondary | ICD-10-CM

## 2015-02-15 MED ORDER — AMPHETAMINE-DEXTROAMPHETAMINE 20 MG PO TABS: 20.0000 mg | ORAL_TABLET | Freq: Two times a day (BID) | ORAL | Status: DC

## 2015-02-15 MED ORDER — CLONAZEPAM 0.5 MG PO TABS: 0.5000 mg | ORAL_TABLET | Freq: Three times a day (TID) | ORAL | Status: DC | PRN

## 2015-02-15 NOTE — Patient Instructions (Signed)

## 2015-02-15 NOTE — Progress Notes (Signed)
Subjective:  Patient ID: Terri Wilson, female    DOB: [birth year]  Age: 29 y.o. MRN: [id number]  CC: Medication Refill   HPI Terri Wilson presents  needing a refill on her Adderall. She's tolerating the medication well is eating and sleeping maintaining her weight. He has no adverse effect of the medication and it has improved concentration and mental function.   Outpatient Prescriptions Prior to Visit  Medication Sig Dispense Refill  . albuterol (PROVENTIL HFA;VENTOLIN HFA) 108 (90 BASE) MCG/ACT inhaler Inhale 2 puffs into the lungs every 4 (four) hours as needed for wheezing or shortness of breath (cough, shortness of breath or wheezing.). (Patient taking differently: Inhale 2 puffs into the lungs every 4 (four) hours as needed for wheezing or shortness of breath (cough). ) 1 Inhaler 1  . citalopram (CELEXA) 40 MG tablet Take 0.5 tablets (20 mg total) by mouth daily. 90 tablet 1  . Multiple Vitamin (MULTIVITAMIN WITH MINERALS) TABS tablet Take 1 tablet by mouth daily.    Marland Kitchen amphetamine-dextroamphetamine (ADDERALL) 20 MG tablet Take 1 tablet (20 mg total) by mouth 2 (two) times daily. Take 1 tablet (20 mg) every morning and 1 tablet (20 mg) after lunch 60 tablet 0  . clonazePAM (KLONOPIN) 0.5 MG tablet Take 1 tablet (0.5 mg total) by mouth 3 (three) times daily as needed for anxiety. 30 tablet 1  . ALPRAZolam (NIRAVAM) 0.5 MG dissolvable tablet Take 1 tablet (0.5 mg total) by mouth 2 (two) times daily as needed for anxiety. (Patient not taking: Reported on 12/18/2014) 20 tablet 0  . Dextromethorphan-Guaifenesin (MUCINEX DM MAXIMUM STRENGTH) 60-1200 MG TB12 Take 1 tablet by mouth every 12 (twelve) hours. (Patient not taking: Reported on 12/18/2014) 14 each 1  . ondansetron (ZOFRAN ODT) 8 MG disintegrating tablet 8mg  ODT q4 hours prn nausea (Patient not taking: Reported on 02/15/2015) 4 tablet 0  . ondansetron (ZOFRAN) 4 MG tablet Take 4 mg by mouth every 6 (six) hours as needed for nausea  or vomiting.     No facility-administered medications prior to visit.    Social History   Social History  . Marital Status: Married    Spouse Name: [name]  . Number of Children: 0  . Years of Education: 14   Occupational History  . 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 Smoking status: Former Smoker -- 1.00 packs/day for 10 years    Types: Cigarettes  . Smokeless tobacco: Never Used  . Alcohol Use: No  . Drug Use: No  . Sexual Activity:    Partners: Male   Other Topics Concern  . None   Social History Narrative   Lives with husband.   Caffeine Use: 40mg /day    Family History  Problem Relation Age of Onset  . Asthma Mother     "allergy induced"  . Breast cancer Maternal Grandmother   . Colon cancer Maternal Grandfather   . Hyperlipidemia Other   . Hypertension Other   . Obesity Other   . Heart disease Other   . Von Willebrand disease Sister     Past Medical History  Diagnosis Date  . Anxiety and depression   . Panic attack   . Herpes labialis   . Migraine   . Allergic rhinitis   . Chiari malformation     7 mm  . Depression   . Gestational diabetes mellitus, antepartum   . Gestational diabetes      Review  of Systems  Constitutional: Negative for fever, chills and appetite change.  HENT: Negative for congestion, ear pain, postnasal drip, sinus pressure and sore throat.   Eyes: Negative for pain and redness.  Respiratory: Negative for cough, shortness of breath and wheezing.   Cardiovascular: Negative for leg swelling.  Gastrointestinal: Negative for nausea, vomiting, abdominal pain, diarrhea, constipation and blood in stool.  Endocrine: Negative for polyuria.  Genitourinary: Negative for dysuria, urgency, frequency and flank pain.  Musculoskeletal: Negative for gait problem.  Skin: Negative for rash.  Neurological: Negative for weakness and headaches.  Psychiatric/Behavioral: Negative for confusion and decreased  concentration. The patient is not nervous/anxious.     Objective:  BP 102/66 mmHg  Pulse 86  Temp(Src) 98.5 F (36.9 C) (Oral)  Resp 18  Wt 127 lb (57.607 kg)  SpO2 99%  LMP 02/03/2015  BP Readings from Last 3 Encounters:  02/15/15 102/66  12/18/14 96/46  12/18/14 102/72    Wt Readings from Last 3 Encounters:  02/15/15 127 lb (57.607 kg)  12/18/14 125 lb (56.7 kg)  12/06/14 125 lb (56.7 kg)    Physical Exam  Constitutional: She is oriented to person, place, and time. She appears well-developed and well-nourished.  HENT:  Head: Normocephalic and atraumatic.  Eyes: Conjunctivae are normal. Pupils are equal, round, and reactive to light.  Pulmonary/Chest: Effort normal.  Musculoskeletal: She exhibits no edema.  Neurological: She is alert and oriented to person, place, and time.  Skin: Skin is dry.  Psychiatric: She has a normal mood and affect. Her behavior is normal. Thought content normal.    Lab Results  Component Value Date   WBC 8.8 12/18/2014   HGB 12.3 12/18/2014   HCT 36.9 12/18/2014   PLT 133* 12/18/2014   GLUCOSE 108* 12/18/2014   ALT 10* 12/18/2014   AST 14* 12/18/2014   NA 139 12/18/2014   K 4.0 12/18/2014   CL 110 12/18/2014   CREATININE 0.63 12/18/2014   BUN 11 12/18/2014   CO2 21* 12/18/2014   TSH 1.695 12/02/2014      .  Assessment & Plan:   Terri Wilson was seen today for medication refill.  Diagnoses and all orders for this visit:  ADD (attention deficit disorder)  Other orders -     amphetamine-dextroamphetamine (ADDERALL) 20 MG tablet; Take 1 tablet (20 mg total) by mouth 2 (two) times daily. Take 1 tablet (20 mg) every morning and 1 tablet (20 mg) after lunch -     clonazePAM (KLONOPIN) 0.5 MG tablet; Take 1 tablet (0.5 mg total) by mouth 3 (three) times daily as needed for anxiety. -     amphetamine-dextroamphetamine (ADDERALL) 20 MG tablet; Take 1 tablet (20 mg total) by mouth 2 (two) times daily. May fill in 30 days -      amphetamine-dextroamphetamine (ADDERALL) 20 MG tablet; Take 1 tablet (20 mg total) by mouth 2 (two) times daily. May fill in 60 days   I am having Terri Wilson start on amphetamine-dextroamphetamine and amphetamine-dextroamphetamine. I am also having her maintain her citalopram, ALPRAZolam, MUCINEX DM MAXIMUM STRENGTH, albuterol, multivitamin with minerals, ondansetron, ondansetron, amphetamine-dextroamphetamine, and clonazePAM.  Meds ordered this encounter  Medications  . amphetamine-dextroamphetamine (ADDERALL) 20 MG tablet    Sig: Take 1 tablet (20 mg total) by mouth 2 (two) times daily. Take 1 tablet (20 mg) every morning and 1 tablet (20 mg) after lunch    Dispense:  60 tablet    Refill:  0  . clonazePAM (KLONOPIN) 0.5 MG tablet  Sig: Take 1 tablet (0.5 mg total) by mouth 3 (three) times daily as needed for anxiety.    Dispense:  30 tablet    Refill:  5  . amphetamine-dextroamphetamine (ADDERALL) 20 MG tablet    Sig: Take 1 tablet (20 mg total) by mouth 2 (two) times daily. May fill in 30 days    Dispense:  60 tablet    Refill:  0  . amphetamine-dextroamphetamine (ADDERALL) 20 MG tablet    Sig: Take 1 tablet (20 mg total) by mouth 2 (two) times daily. May fill in 60 days    Dispense:  60 tablet    Refill:  0    Appropriate red flag conditions were discussed with the patient as well as actions that should be taken.  Patient expressed his understanding.  Follow-up: Return if symptoms worsen or fail to improve.  Roselee Culver, MD

## 2015-02-20 ENCOUNTER — Ambulatory Visit (INDEPENDENT_AMBULATORY_CARE_PROVIDER_SITE_OTHER): Payer: 59 | Admitting: Family Medicine

## 2015-02-20 VITALS — BP 98/62 | HR 94 | Temp 96.8°F | Resp 14 | Ht 68.0 in | Wt 127.0 lb

## 2015-02-20 DIAGNOSIS — R5381 Other malaise: Secondary | ICD-10-CM | POA: Diagnosis not present

## 2015-02-20 DIAGNOSIS — M791 Myalgia, unspecified site: Secondary | ICD-10-CM

## 2015-02-20 DIAGNOSIS — J029 Acute pharyngitis, unspecified: Secondary | ICD-10-CM | POA: Diagnosis not present

## 2015-02-20 LAB — POCT CBC
Granulocyte percent: 51.8 %G (ref 37–80)
HCT, POC: 37.9 % (ref 37.7–47.9)
Hemoglobin: 11.9 g/dL — AB (ref 12.2–16.2)
Lymph, poc: 1.3 (ref 0.6–3.4)
MCH, POC: 27 pg (ref 27–31.2)
MCHC: 31.5 g/dL — AB (ref 31.8–35.4)
MCV: 85.9 fL (ref 80–97)
MID (cbc): 0.5 (ref 0–0.9)
MPV: 6.3 fL (ref 0–99.8)
POC Granulocyte: 1.9 — AB (ref 2–6.9)
POC LYMPH PERCENT: 35.6 %L (ref 10–50)
POC MID %: 12.6 %M — AB (ref 0–12)
Platelet Count, POC: 156 10*3/uL (ref 142–424)
RBC: 4.42 M/uL (ref 4.04–5.48)
RDW, POC: 13.4 %
WBC: 3.6 10*3/uL — AB (ref 4.6–10.2)

## 2015-02-20 LAB — COMPREHENSIVE METABOLIC PANEL
ALT: 11 U/L (ref 6–29)
AST: 16 U/L (ref 10–30)
Albumin: 3.8 g/dL (ref 3.6–5.1)
Alkaline Phosphatase: 59 U/L (ref 33–115)
BUN: 11 mg/dL (ref 7–25)
CO2: 29 mmol/L (ref 20–31)
Calcium: 8.8 mg/dL (ref 8.6–10.2)
Chloride: 105 mmol/L (ref 98–110)
Creat: 0.76 mg/dL (ref 0.50–1.10)
Glucose, Bld: 104 mg/dL — ABNORMAL HIGH (ref 65–99)
Potassium: 4.1 mmol/L (ref 3.5–5.3)
Sodium: 139 mmol/L (ref 135–146)
Total Bilirubin: 0.4 mg/dL (ref 0.2–1.2)
Total Protein: 6.1 g/dL (ref 6.1–8.1)

## 2015-02-20 LAB — POCT RAPID STREP A (OFFICE): Rapid Strep A Screen: NEGATIVE

## 2015-02-20 LAB — POCT SEDIMENTATION RATE: POCT SED RATE: 7 mm/hr (ref 0–22)

## 2015-02-20 LAB — POCT URINALYSIS DIP (MANUAL ENTRY)
Bilirubin, UA: NEGATIVE
Blood, UA: NEGATIVE
Glucose, UA: NEGATIVE
Ketones, POC UA: NEGATIVE
Nitrite, UA: NEGATIVE
Protein Ur, POC: NEGATIVE
Spec Grav, UA: 1.01
Urobilinogen, UA: 0.2
pH, UA: 6.5

## 2015-02-20 LAB — POCT INFLUENZA A/B
Influenza A, POC: NEGATIVE
Influenza B, POC: NEGATIVE

## 2015-02-20 LAB — POC MICROSCOPIC URINALYSIS (UMFC): Mucus: ABSENT

## 2015-02-20 MED ORDER — KETOROLAC TROMETHAMINE 60 MG/2ML IM SOLN
60.0000 mg | Freq: Once | INTRAMUSCULAR | Status: AC
  Administered 2015-02-20: 60 mg via INTRAMUSCULAR

## 2015-02-20 NOTE — Progress Notes (Signed)
Subjective:    Patient ID: Terri Wilson, female    DOB: 1986/02/25, 29 y.o.   MRN: [id number]  02/20/2015  Fatigue and Generalized Body Aches   HPI This 29 y.o. female presents for evaluation of acute illness.  Onset two days ago.  Developed groin pain, axillary pain, neck pain.  No palpable lymph nodes.  Felt like getting sick.  Husband gave something for pain.  Spread to all of joints and muscles.  Barely able to walk. No fever.  Terrible night sweats.  This morning, thought was going to ED; unable to move feet, arms, neck.  Took Ibuprofen with improvement. Every muscle is on fire; joints are painful.  Headache.  Does not feel like sick; feels like unknown. Realizes likely a virus but Ibuprofen not helping a lot. Feels like ran a marathon.  With use, muscles burn.    Mild ST and thought had blisters but thought due to allergies and PND.  Horribly fatigued.  No ear pain.  No rhinorrhea; nasal congestion; no cough.  No rash.  No dysuria, frequency, urgency.  No back pain.  No n/v/d. Mild abdominal discomfort.  No vaginal discharge.  Tick bite two months ago.  [place] two weeks ago; [place].  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  [place] had hand foot and mouth last week.  No blisters on hands or feet.  Ibuprofen 800mg  every four hours.     Review of Systems  Constitutional: Positive for diaphoresis. Negative for fever, chills and fatigue.  Eyes: Negative for photophobia and visual disturbance.  Respiratory: Negative for cough and shortness of breath.   Cardiovascular: Negative for chest pain, palpitations and leg swelling.  Gastrointestinal: Negative for nausea, vomiting, abdominal pain, diarrhea and constipation.  Endocrine: Negative for cold intolerance, heat intolerance, polydipsia, polyphagia and polyuria.  Genitourinary: Negative for dysuria, urgency, frequency, hematuria, flank pain, vaginal bleeding, vaginal discharge, genital sores and vaginal pain.  Musculoskeletal: Positive  for myalgias, back pain, arthralgias, neck pain and neck stiffness. Negative for joint swelling.  Skin: Negative for rash and wound.  Neurological: Negative for dizziness, tremors, seizures, syncope, facial asymmetry, speech difficulty, weakness, light-headedness, numbness and headaches.  Psychiatric/Behavioral: Negative for confusion.    Past Medical History  Diagnosis Date  . Anxiety and depression   . Panic attack   . Herpes labialis   . Migraine   . Allergic rhinitis   . Chiari malformation     7 mm  . Depression   . Gestational diabetes mellitus, antepartum   . Gestational diabetes    Past Surgical History  Procedure Laterality Date  . Tonsillectomy and adenoidectomy  2001  . Wisdom tooth extraction     Allergies  Allergen Reactions  . Lamictal [Lamotrigine] Anaphylaxis  . Hydrocodone Nausea Only    Tolerates codeine  . Vancomycin Other (See Comments)    Feels hot   Current Outpatient Prescriptions  Medication Sig Dispense Refill  . albuterol (PROVENTIL HFA;VENTOLIN HFA) 108 (90 BASE) MCG/ACT inhaler Inhale 2 puffs into the lungs every 4 (four) hours as needed for wheezing or shortness of breath (cough, shortness of breath or wheezing.). (Patient taking differently: Inhale 2 puffs into the lungs every 4 (four) hours as needed for wheezing or shortness of breath (cough). ) 1 Inhaler 1  . ALPRAZolam (NIRAVAM) 0.5 MG dissolvable tablet Take 1 tablet (0.5 mg total) by mouth 2 (two) times daily as needed for anxiety. 20 tablet 0  . amphetamine-dextroamphetamine (ADDERALL) 20 MG tablet  Take 1 tablet (20 mg total) by mouth 2 (two) times daily. Take 1 tablet (20 mg) every morning and 1 tablet (20 mg) after lunch 60 tablet 0  . amphetamine-dextroamphetamine (ADDERALL) 20 MG tablet Take 1 tablet (20 mg total) by mouth 2 (two) times daily. May fill in 30 days 60 tablet 0  . amphetamine-dextroamphetamine (ADDERALL) 20 MG tablet Take 1 tablet (20 mg total) by mouth 2 (two) times daily.  May fill in 60 days 60 tablet 0  . citalopram (CELEXA) 40 MG tablet Take 0.5 tablets (20 mg total) by mouth daily. 90 tablet 1  . clonazePAM (KLONOPIN) 0.5 MG tablet Take 1 tablet (0.5 mg total) by mouth 3 (three) times daily as needed for anxiety. 30 tablet 5  . Dextromethorphan-Guaifenesin (MUCINEX DM MAXIMUM STRENGTH) 60-1200 MG TB12 Take 1 tablet by mouth every 12 (twelve) hours. 14 each 1  . Multiple Vitamin (MULTIVITAMIN WITH MINERALS) TABS tablet Take 1 tablet by mouth daily.    . ondansetron (ZOFRAN) 4 MG tablet Take 4 mg by mouth every 6 (six) hours as needed for nausea or vomiting.    . ondansetron (ZOFRAN ODT) 8 MG disintegrating tablet 8mg  ODT q4 hours prn nausea (Patient not taking: Reported on 02/15/2015) 4 tablet 0   No current facility-administered medications for this visit.   Social History   Social History  . Marital Status: Married    Spouse Name: [name]  . Number of Children: 0  . Years of Education: 14   Occupational History  . 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 Smoking status: Former Smoker -- 1.00 packs/day for 10 years    Types: Cigarettes  . Smokeless tobacco: Never Used  . Alcohol Use: No  . Drug Use: No  . Sexual Activity:    Partners: Male   Other Topics Concern  . Not on file   Social History Narrative   Lives with husband.   Caffeine Use: 40mg /day   Family History  Problem Relation Age of Onset  . Asthma Mother     "allergy induced"  . Breast cancer Maternal Grandmother   . Colon cancer Maternal Grandfather   . Hyperlipidemia Other   . Hypertension Other   . Obesity Other   . Heart disease Other   . Von Willebrand disease Sister        Objective:    BP 98/62 mmHg  Pulse 94  Temp(Src) 96.8 F (36 C)  Resp 14  Ht 5\' 8"  (1.727 m)  Wt 127 lb (57.607 kg)  BMI 19.31 kg/m2  SpO2 98%  LMP 02/03/2015 Physical Exam  Constitutional: She is oriented to person, place, and time. She appears  well-developed and well-nourished. She has a sickly appearance. She appears ill. No distress.  HENT:  Head: Normocephalic and atraumatic.  Right Ear: External ear normal.  Left Ear: External ear normal.  Nose: Nose normal.  Mouth/Throat: Oropharynx is clear and moist. No oropharyngeal exudate.  Eyes: Conjunctivae and EOM are normal. Pupils are equal, round, and reactive to light.  Neck: Normal range of motion. Neck supple. Carotid bruit is not present. No thyromegaly present.  Cardiovascular: Normal rate, regular rhythm, normal heart sounds and intact distal pulses.  Exam reveals no gallop and no friction rub.   No murmur heard. Pulmonary/Chest: Effort normal and breath sounds normal. She has no wheezes. She has no rales.  Abdominal: Soft. Bowel sounds are normal. She exhibits no distension and no mass.  There is no tenderness. There is no rebound and no guarding.  Musculoskeletal: She exhibits no edema.  Lymphadenopathy:    She has no cervical adenopathy.  Neurological: She is alert and oriented to person, place, and time. No cranial nerve deficit. She exhibits normal muscle tone. Coordination normal.  Skin: Skin is warm and dry. No rash noted. She is not diaphoretic. No erythema. No pallor.  Psychiatric: She has a normal mood and affect. Her behavior is normal.    toradol 60mg  im administered in office.    Assessment & Plan:   1. Sore throat   2. Malaise   3. Myalgia    -New.  Consistent with viral syndrome. -Supportive care with rest, fluids, Ibuprofen and Tylenol. -RTC 5-7 days if no improvement and sooner if worse. -evolving autoimmune process cannot be ruled out; acute onset most consistent with viral etiology.   Orders Placed This Encounter  Procedures  . Urine culture  . Culture, Group A Strep  . Comprehensive metabolic panel  . POCT CBC  . POCT Influenza A/B  . POCT Microscopic Urinalysis ([employer])  . POCT SEDIMENTATION RATE  . POCT rapid strep A  . POCT urinalysis  dipstick   Meds ordered this encounter  Medications  . ketorolac (TORADOL) injection 60 mg    Sig:     No Follow-up on file.   Akili Cuda Elayne Guerin, M.D. [facility] [address] [place], [place]  [address] [phone] phone [phone] fax

## 2015-02-20 NOTE — Patient Instructions (Signed)

## 2015-02-21 ENCOUNTER — Telehealth: Payer: Self-pay

## 2015-02-21 ENCOUNTER — Encounter: Payer: Self-pay | Admitting: Family Medicine

## 2015-02-21 LAB — URINE CULTURE: Colony Count: 75000

## 2015-02-21 NOTE — Telephone Encounter (Signed)
Pt requested lab results. Not reviewed yet. Waiting culture results.

## 2015-02-22 ENCOUNTER — Telehealth: Payer: Self-pay

## 2015-02-22 LAB — CULTURE, GROUP A STREP: Organism ID, Bacteria: NORMAL

## 2015-02-22 NOTE — Telephone Encounter (Signed)
Pt is still in a lot of pain and can not even hold her pencil to write and the advil is not helping

## 2015-02-22 NOTE — Telephone Encounter (Signed)
Faxed over an order for pt to receive Toradol injection at work.

## 2015-02-23 ENCOUNTER — Other Ambulatory Visit (INDEPENDENT_AMBULATORY_CARE_PROVIDER_SITE_OTHER): Payer: 59 | Admitting: Internal Medicine

## 2015-02-23 DIAGNOSIS — M791 Myalgia, unspecified site: Secondary | ICD-10-CM

## 2015-02-23 MED ORDER — KETOROLAC TROMETHAMINE 60 MG/2ML IM SOLN
60.0000 mg | Freq: Once | INTRAMUSCULAR | Status: AC
  Administered 2015-02-23: 60 mg via INTRAMUSCULAR

## 2015-03-15 ENCOUNTER — Encounter: Payer: Self-pay | Admitting: Family Medicine

## 2015-04-04 ENCOUNTER — Telehealth: Payer: Self-pay

## 2015-04-04 MED ORDER — AMPHETAMINE-DEXTROAMPHETAMINE 20 MG PO TABS: 20.0000 mg | ORAL_TABLET | Freq: Two times a day (BID) | ORAL | Status: DC

## 2015-04-04 NOTE — Telephone Encounter (Signed)
patient called in and stated that she had misplaced 2 months of her adderall rx and she needs it to be written again. Stated that she isnt sure what happened to it, she stated it was written by Dr Laney Pastor. She said that if she could get one months that would be fine but she did misplace 2 months worth.  Her call back number is [phone]. Thanks!

## 2015-04-04 NOTE — Telephone Encounter (Signed)
Meds ordered this encounter  Medications  . amphetamine-dextroamphetamine (ADDERALL) 20 MG tablet    Sig: Take 1 tablet (20 mg total) by mouth 2 (two) times daily. Take 1 tablet (20 mg) every morning and 1 tablet (20 mg) after lunch    Dispense:  60 tablet    Refill:  0  . amphetamine-dextroamphetamine (ADDERALL) 20 MG tablet    Sig: Take 1 tablet (20 mg total) by mouth 2 (two) times daily. May fill  30 days after signed    Dispense:  60 tablet    Refill:  0

## 2015-04-04 NOTE — Telephone Encounter (Signed)
Spoke to Dr. Laney Pastor. He will write rx. Called and informed pt rx is ready for pick up.

## 2015-04-18 ENCOUNTER — Encounter: Payer: Self-pay | Admitting: Internal Medicine

## 2015-04-28 ENCOUNTER — Ambulatory Visit: Payer: 59

## 2015-04-29 ENCOUNTER — Encounter: Payer: Self-pay | Admitting: Family Medicine

## 2015-04-29 ENCOUNTER — Ambulatory Visit (INDEPENDENT_AMBULATORY_CARE_PROVIDER_SITE_OTHER): Payer: 59 | Admitting: Family Medicine

## 2015-04-29 VITALS — BP 110/62 | HR 88 | Temp 98.6°F | Resp 18 | Ht 68.0 in | Wt 126.0 lb

## 2015-04-29 DIAGNOSIS — J040 Acute laryngitis: Secondary | ICD-10-CM | POA: Diagnosis not present

## 2015-04-29 DIAGNOSIS — J0100 Acute maxillary sinusitis, unspecified: Secondary | ICD-10-CM

## 2015-04-29 DIAGNOSIS — J209 Acute bronchitis, unspecified: Secondary | ICD-10-CM

## 2015-04-29 MED ORDER — METHYLPREDNISOLONE ACETATE 40 MG/ML IJ SUSP
40.0000 mg | Freq: Once | INTRAMUSCULAR | Status: AC
  Administered 2015-04-29: 40 mg via INTRAMUSCULAR

## 2015-04-29 MED ORDER — PREDNISONE 20 MG PO TABS: 40.0000 mg | ORAL_TABLET | Freq: Every day | ORAL | Status: DC

## 2015-04-29 MED ORDER — GUAIFENESIN-CODEINE 100-10 MG/5ML PO SOLN: 5.0000 mL | Freq: Four times a day (QID) | ORAL | Status: DC | PRN

## 2015-04-29 MED ORDER — CEFTRIAXONE SODIUM 1 G IJ SOLR
500.0000 mg | Freq: Once | INTRAMUSCULAR | Status: AC
  Administered 2015-04-29: 500 mg via INTRAMUSCULAR

## 2015-04-29 MED ORDER — AMOXICILLIN-POT CLAVULANATE 875-125 MG PO TABS: 1.0000 | ORAL_TABLET | Freq: Two times a day (BID) | ORAL | Status: DC

## 2015-04-29 NOTE — Progress Notes (Signed)
Patient ID: Terri Wilson, female   DOB: [birth year], 29 y.o.   MRN: [id number]   Subjective:    Patient ID: Terri Wilson, female    DOB: 06-09-1985, 29 y.o.   MRN: [id number]  Patient presents for Illness  Here with worsening respiratory symptoms over the past week or so. Positive sick contacts with her family. She initially started with some sore throat and sinus drainage she now has cough with mild production of green sputum she's had low-grade fever as well as chills bodyaches headache all which seem to be changing or worsening as her illness progresses. She is now course she was unable to speak at all yesterday. She smokes intermittently. She's been using over-the-counter medications with no improvement. She has achiness in her chest from coughing. Her symptoms tend to be worse at night just to give herself a breathing treatment last night which helped a little.   Review Of Systems:  GEN- + fatigue, +fever, weight loss,weakness, recent illness HEENT- denies eye drainage, change in vision,+ nasal discharge, CVS- denies chest pain, palpitations RESP- denies SOB, +cough, wheeze ABD- denies N/V, change in stools, abd pain GU- denies dysuria, hematuria, dribbling, incontinence MSK- denies joint pain, muscle aches, injury Neuro-+ headache, dizziness, syncope, seizure activity       Objective:    BP 110/62 mmHg  Pulse 88  Temp(Src) 98.6 F (37 C) (Oral)  Resp 18  Ht 5\' 8"  (1.727 m)  Wt 126 lb (57.153 kg)  BMI 19.16 kg/m2  SpO2 98% GEN- NAD, alert and oriented x3, hoarse HEENT- PERRL, EOMI, non injected sclera, pink conjunctiva, MMM, oropharynx mild injection, TM clear bilat no effusion,  + Mild maxillary sinus tenderness, inflammed turbinates,  Nasal drainage  Neck- Supple, + ant LAD CVS- RRR, no murmur RESP-occasional wheeze, normal WOB, no rales  EXT- No edema Pulses- Radial 2+         Assessment & Plan:      Problem List Items Addressed This Visit    None    Visit Diagnoses    Acute bronchitis, unspecified organism    -  Primary    Treat with Rocephin IM and Depo Medrol 40mg  IM, history of PNA, will have her start Augmentin, prednisone tomorrow, has albuterol at home. Rest for voice Robitussin AC    Relevant Medications    cefTRIAXone (ROCEPHIN) injection 500 mg (Completed)    methylPREDNISolone acetate (DEPO-MEDROL) injection 40 mg (Completed)    Laryngitis        Relevant Medications    cefTRIAXone (ROCEPHIN) injection 500 mg (Completed)    methylPREDNISolone acetate (DEPO-MEDROL) injection 40 mg (Completed)    Acute maxillary sinusitis, recurrence not specified        Relevant Medications    amoxicillin-clavulanate (AUGMENTIN) 875-125 MG tablet    predniSONE (DELTASONE) 20 MG tablet    guaiFENesin-codeine 100-10 MG/5ML syrup    cefTRIAXone (ROCEPHIN) injection 500 mg (Completed)    methylPREDNISolone acetate (DEPO-MEDROL) injection 40 mg (Completed)       Note: This dictation was prepared with Dragon dictation along with smaller phrase technology. Any transcriptional errors that result from this process are unintentional.

## 2015-04-29 NOTE — Patient Instructions (Signed)
[facility] Take antibiotics and prednisone starting tomorrow Corliss Blacker of fluids/hot tea F/U as needed

## 2015-05-13 ENCOUNTER — Telehealth: Payer: Self-pay

## 2015-05-13 MED ORDER — AMPHETAMINE-DEXTROAMPHETAMINE 20 MG PO TABS: 20.0000 mg | ORAL_TABLET | Freq: Two times a day (BID) | ORAL | Status: DC

## 2015-05-13 NOTE — Telephone Encounter (Signed)
Pt would like a refill on her amphetamine-dextroamphetamine (ADDERALL) 20 MG tablet [id number]. CB #  [phone]

## 2015-05-13 NOTE — Telephone Encounter (Signed)
Meds ordered this encounter  Medications  . amphetamine-dextroamphetamine (ADDERALL) 20 MG tablet    Sig: Take 1 tablet (20 mg total) by mouth 2 (two) times daily.    Dispense:  60 tablet    Refill:  0    

## 2015-05-25 ENCOUNTER — Encounter (HOSPITAL_COMMUNITY): Payer: Self-pay | Admitting: Emergency Medicine

## 2015-05-25 ENCOUNTER — Ambulatory Visit (INDEPENDENT_AMBULATORY_CARE_PROVIDER_SITE_OTHER): Payer: 59 | Admitting: Emergency Medicine

## 2015-05-25 ENCOUNTER — Encounter: Payer: Self-pay | Admitting: Emergency Medicine

## 2015-05-25 ENCOUNTER — Emergency Department (HOSPITAL_COMMUNITY)
Admission: EM | Admit: 2015-05-25 | Discharge: 2015-05-25 | Disposition: A | Discharge status: Home / Self Care | Payer: 59 | Attending: Emergency Medicine | Admitting: Emergency Medicine

## 2015-05-25 VITALS — BP 110/62 | HR 111 | Temp 98.5°F

## 2015-05-25 DIAGNOSIS — G43909 Migraine, unspecified, not intractable, without status migrainosus: Secondary | ICD-10-CM | POA: Diagnosis not present

## 2015-05-25 DIAGNOSIS — R1084 Generalized abdominal pain: Secondary | ICD-10-CM

## 2015-05-25 DIAGNOSIS — Z3202 Encounter for pregnancy test, result negative: Secondary | ICD-10-CM | POA: Insufficient documentation

## 2015-05-25 DIAGNOSIS — Q0700 Arnold-Chiari syndrome without spina bifida or hydrocephalus: Secondary | ICD-10-CM | POA: Diagnosis not present

## 2015-05-25 DIAGNOSIS — Z8619 Personal history of other infectious and parasitic diseases: Secondary | ICD-10-CM | POA: Diagnosis not present

## 2015-05-25 DIAGNOSIS — R197 Diarrhea, unspecified: Secondary | ICD-10-CM | POA: Diagnosis not present

## 2015-05-25 DIAGNOSIS — F17210 Nicotine dependence, cigarettes, uncomplicated: Secondary | ICD-10-CM | POA: Diagnosis not present

## 2015-05-25 DIAGNOSIS — E860 Dehydration: Secondary | ICD-10-CM | POA: Diagnosis not present

## 2015-05-25 DIAGNOSIS — R112 Nausea with vomiting, unspecified: Secondary | ICD-10-CM

## 2015-05-25 DIAGNOSIS — Z79899 Other long term (current) drug therapy: Secondary | ICD-10-CM | POA: Diagnosis not present

## 2015-05-25 DIAGNOSIS — F410 Panic disorder [episodic paroxysmal anxiety] without agoraphobia: Secondary | ICD-10-CM | POA: Diagnosis not present

## 2015-05-25 DIAGNOSIS — R1111 Vomiting without nausea: Secondary | ICD-10-CM | POA: Diagnosis not present

## 2015-05-25 DIAGNOSIS — Z8632 Personal history of gestational diabetes: Secondary | ICD-10-CM | POA: Diagnosis not present

## 2015-05-25 LAB — COMPREHENSIVE METABOLIC PANEL
ALT: 11 U/L — ABNORMAL LOW (ref 14–54)
AST: 17 U/L (ref 15–41)
Albumin: 3.2 g/dL — ABNORMAL LOW (ref 3.5–5.0)
Alkaline Phosphatase: 45 U/L (ref 38–126)
Anion gap: 7 (ref 5–15)
BUN: 10 mg/dL (ref 6–20)
CO2: 22 mmol/L (ref 22–32)
Calcium: 7.4 mg/dL — ABNORMAL LOW (ref 8.9–10.3)
Chloride: 110 mmol/L (ref 101–111)
Creatinine, Ser: 0.58 mg/dL (ref 0.44–1.00)
GFR calc Af Amer: >60 mL/min (ref >60)
GFR calc non Af Amer: >60 mL/min (ref >60)
Glucose, Bld: 108 mg/dL — ABNORMAL HIGH (ref 65–99)
Potassium: 3.8 mmol/L (ref 3.5–5.1)
Sodium: 139 mmol/L (ref 135–145)
Total Bilirubin: 0.8 mg/dL (ref 0.3–1.2)
Total Protein: 5.4 g/dL — ABNORMAL LOW (ref 6.5–8.1)

## 2015-05-25 LAB — URINALYSIS, ROUTINE W REFLEX MICROSCOPIC
Bilirubin Urine: NEGATIVE
Glucose, UA: NEGATIVE mg/dL
Hgb urine dipstick: NEGATIVE
Ketones, ur: NEGATIVE mg/dL
Leukocytes, UA: NEGATIVE
Nitrite: NEGATIVE
Protein, ur: NEGATIVE mg/dL
Specific Gravity, Urine: 1.021 (ref 1.005–1.030)
pH: 6 (ref 5.0–8.0)

## 2015-05-25 LAB — POCT CBC
Granulocyte percent: 92.3 %G — AB (ref 37–80)
HCT, POC: 43.4 % (ref 37.7–47.9)
Hemoglobin: 15.2 g/dL (ref 12.2–16.2)
Lymph, poc: 0.6 (ref 0.6–3.4)
MCH, POC: 30.2 pg (ref 27–31.2)
MCHC: 34.9 g/dL (ref 31.8–35.4)
MCV: 86.4 fL (ref 80–97)
MID (cbc): 0.2 (ref 0–0.9)
MPV: 6.3 fL (ref 0–99.8)
POC Granulocyte: 9.5 — AB (ref 2–6.9)
POC LYMPH PERCENT: 5.4 %L — AB (ref 10–50)
POC MID %: 2.3 %M (ref 0–12)
Platelet Count, POC: 178 10*3/uL (ref 142–424)
RBC: 5.02 M/uL (ref 4.04–5.48)
RDW, POC: 13.1 %
WBC: 10.3 10*3/uL — AB (ref 4.6–10.2)

## 2015-05-25 LAB — CBC
HCT: 35.9 % — ABNORMAL LOW (ref 36.0–46.0)
Hemoglobin: 11.9 g/dL — ABNORMAL LOW (ref 12.0–15.0)
MCH: 29.8 pg (ref 26.0–34.0)
MCHC: 33.1 g/dL (ref 30.0–36.0)
MCV: 90 fL (ref 78.0–100.0)
Platelets: 138 10*3/uL — ABNORMAL LOW (ref 150–400)
RBC: 3.99 MIL/uL (ref 3.87–5.11)
RDW: 13 % (ref 11.5–15.5)
WBC: 6.2 10*3/uL (ref 4.0–10.5)

## 2015-05-25 LAB — BASIC METABOLIC PANEL WITH GFR
BUN: 10 mg/dL (ref 7–25)
CO2: 25 mmol/L (ref 20–31)
Calcium: 9 mg/dL (ref 8.6–10.2)
Chloride: 104 mmol/L (ref 98–110)
Creat: 0.8 mg/dL (ref 0.50–1.10)
GFR, Est African American: >89 mL/min (ref >=60)
GFR, Est Non African American: >89 mL/min (ref >=60)
Glucose, Bld: 119 mg/dL — ABNORMAL HIGH (ref 65–99)
Potassium: 4.4 mmol/L (ref 3.5–5.3)
Sodium: 140 mmol/L (ref 135–146)

## 2015-05-25 LAB — LIPASE, BLOOD: Lipase: 14 U/L (ref 11–51)

## 2015-05-25 LAB — PREGNANCY, URINE: Preg Test, Ur: NEGATIVE

## 2015-05-25 MED ORDER — KETOROLAC TROMETHAMINE 30 MG/ML IJ SOLN
30.0000 mg | Freq: Once | INTRAMUSCULAR | Status: AC
  Administered 2015-05-25: 30 mg via INTRAVENOUS
  Filled 2015-05-25: qty 1

## 2015-05-25 MED ORDER — PROMETHAZINE HCL 25 MG/ML IJ SOLN
25.0000 mg | Freq: Once | INTRAMUSCULAR | Status: AC
  Administered 2015-05-25: 25 mg via INTRAMUSCULAR

## 2015-05-25 MED ORDER — ONDANSETRON HCL 4 MG/2ML IJ SOLN
4.0000 mg | Freq: Once | INTRAMUSCULAR | Status: AC
  Administered 2015-05-25: 4 mg via INTRAVENOUS
  Filled 2015-05-25: qty 2

## 2015-05-25 MED ORDER — MORPHINE SULFATE (PF) 4 MG/ML IV SOLN
4.0000 mg | Freq: Once | INTRAVENOUS | Status: AC
  Administered 2015-05-25: 4 mg via INTRAVENOUS
  Filled 2015-05-25: qty 1

## 2015-05-25 MED ORDER — SODIUM CHLORIDE 0.9 % IV BOLUS (SEPSIS)
2000.0000 mL | Freq: Once | INTRAVENOUS | Status: AC
  Administered 2015-05-25: 2000 mL via INTRAVENOUS

## 2015-05-25 MED ORDER — ONDANSETRON 8 MG PO TBDP: 8.0000 mg | ORAL_TABLET | Freq: Three times a day (TID) | ORAL | Status: DC | PRN

## 2015-05-25 NOTE — ED Notes (Signed)
Per EMS pt from [facility] pt had n/v/d this am with cramps and not feeling well. Pt has IV 20 in right AC and has had 1 bag of fluid and is on her second. 25 mg of Phenergan giving IM at [facility]gent [facility]. BP 105/74 HR 95.

## 2015-05-25 NOTE — ED Notes (Signed)
Bed: [id number] Expected date:  Expected time:  Means of arrival:  Comments: Ems- n/v

## 2015-05-25 NOTE — ED Provider Notes (Signed)
CSN: [id number]     Arrival date & time 05/25/15  0912 History   First MD Initiated Contact with Patient 05/25/15 0945     No chief complaint on file.     HPI Patient presents to the emergency department complaining of nausea vomiting diarrhea over the past 48 hours.  She was seen in urgent care today and given Phenergan but still feeling poorly.  She reports recent similar symptoms with her child.  She reports abdominal cramping without abdominal pain.  No chest pain shortness of breath.  Symptoms are moderate in severity.   Past Medical History  Diagnosis Date  . Anxiety and depression   . Panic attack   . Herpes labialis   . Migraine   . Allergic rhinitis   . Chiari malformation     7 mm  . Depression   . Gestational diabetes mellitus, antepartum   . Gestational diabetes    Past Surgical History  Procedure Laterality Date  . Tonsillectomy and adenoidectomy  2001  . Wisdom tooth extraction     Family History  Problem Relation Age of Onset  . Asthma Mother     "allergy induced"  . Breast cancer Maternal Grandmother   . Colon cancer Maternal Grandfather   . Hyperlipidemia Other   . Hypertension Other   . Obesity Other   . Heart disease Other   . Von Willebrand disease Sister    Social History  Substance Use Topics  . Smoking status: Current Some Day Smoker -- 1.00 packs/day for 10 years    Types: Cigarettes  . Smokeless tobacco: Never Used  . Alcohol Use: No   OB History    Gravida Para Term Preterm AB TAB SAB Ectopic Multiple Living   1 1 1  0 0 0 0 0 0 1     Review of Systems  All other systems reviewed and are negative.     Allergies  Lamictal; Hydrocodone; and Vancomycin  Home Medications   Prior to Admission medications   Medication Sig Start Date End Date Taking? Authorizing Provider  amphetamine-dextroamphetamine (ADDERALL) 20 MG tablet Take 1 tablet (20 mg total) by mouth 2 (two) times daily. 05/13/15  Yes Leandrew Koyanagi, MD  citalopram  (CELEXA) 40 MG tablet Take 0.5 tablets (20 mg total) by mouth daily. 07/28/14  Yes Leandrew Koyanagi, MD  clonazePAM (KLONOPIN) 0.5 MG tablet Take 0.5 mg by mouth 3 (three) times daily as needed for anxiety.   Yes Historical Provider, MD  ibuprofen (ADVIL,MOTRIN) 200 MG tablet Take 200 mg by mouth every 6 (six) hours as needed for fever or moderate pain.   Yes Historical Provider, MD  Multiple Vitamin (MULTIVITAMIN WITH MINERALS) TABS tablet Take 1 tablet by mouth daily. Reported on 05/25/2015   Yes Historical Provider, MD  ondansetron (ZOFRAN ODT) 8 MG disintegrating tablet Take 1 tablet (8 mg total) by mouth every 8 (eight) hours as needed for nausea or vomiting. 05/25/15   Jola Schmidt, MD   BP 106/59 mmHg  Pulse 100  Temp(Src) 99.5 F (37.5 C) (Oral)  Resp 18  Ht 5\' 7"  (1.702 m)  Wt 120 lb (54.432 kg)  BMI 18.79 kg/m2  SpO2 96%  LMP 05/19/2015 Physical Exam  Constitutional: She is oriented to person, place, and time. She appears well-developed and well-nourished. No distress.  HENT:  Head: Normocephalic and atraumatic.  Eyes: EOM are normal.  Neck: Normal range of motion.  Cardiovascular: Normal rate, regular rhythm and normal heart sounds.  Pulmonary/Chest: Effort normal and breath sounds normal.  Abdominal: Soft. She exhibits no distension. There is no tenderness.  Musculoskeletal: Normal range of motion.  Neurological: She is alert and oriented to person, place, and time.  Skin: Skin is warm and dry.  Psychiatric: She has a normal mood and affect. Judgment normal.  Nursing note and vitals reviewed.   ED Course  Procedures (including critical care time) Labs Review Labs Reviewed  COMPREHENSIVE METABOLIC PANEL - Abnormal; Notable for the following:    Glucose, Bld 108 (*)    Calcium 7.4 (*)    Total Protein 5.4 (*)    Albumin 3.2 (*)    ALT 11 (*)    All other components within normal limits  CBC - Abnormal; Notable for the following:    Hemoglobin 11.9 (*)    HCT 35.9  (*)    Platelets 138 (*)    All other components within normal limits  URINALYSIS, ROUTINE W REFLEX MICROSCOPIC (NOT AT [facility]) - Abnormal; Notable for the following:    Color, Urine AMBER (*)    APPearance CLOUDY (*)    All other components within normal limits  LIPASE, BLOOD  PREGNANCY, URINE    Imaging Review No results found. I have personally reviewed and evaluated these images and lab results as part of my medical decision-making.   EKG Interpretation None      MDM   Final diagnoses:  Nausea vomiting and diarrhea    Patient feels much better after IV fluids.  Discharge home in good condition.  Home with antinausea medicine.  Primary care follow-up.  She understands to return to the ER for new or worsening symptoms.  Likely viral.    Jola Schmidt, MD 05/25/15 1318

## 2015-05-25 NOTE — Progress Notes (Signed)
Patient ID: Terri Wilson, female   DOB: 1985-08-07, 30 y.o.   MRN: [id number]     By signing my name below, I, Zola Button, attest that this documentation has been prepared under the direction and in the presence of Arlyss Queen, MD.  Electronically Signed: Zola Button, Medical Scribe. 05/25/2015. 8:07 AM.   Chief Complaint:  Chief Complaint  Patient presents with  . Diarrhea    sence 2am today - 15 x since 2am  . Nausea    x 2 days vomited more than 15 times since 2am    HPI: Terri Wilson is a 30 y.o. female who reports to [facility] today complaining of intermittent episodes of diarrhea that started 6 hours ago. Patient reports having associated vomiting, abdominal pain, generalized myalgias, headache, and chills. Her mother gave her some Zofran about 4 hours ago which did provide some relief. Patient denies fever. She also denies recent travel. She did take a course of amoxicillin on December 9th for URI symptoms. Several of her family members have had similar symptoms.   Past Medical History  Diagnosis Date  . Anxiety and depression   . Panic attack   . Herpes labialis   . Migraine   . Allergic rhinitis   . Chiari malformation     7 mm  . Depression   . Gestational diabetes mellitus, antepartum   . Gestational diabetes    Past Surgical History  Procedure Laterality Date  . Tonsillectomy and adenoidectomy  2001  . Wisdom tooth extraction     Social History   Social History  . Marital Status: Married    Spouse Name: [name]  . Number of Children: 1  . Years of Education: 14   Occupational History  . BUSINESS ASSISTANT [employer]   Social History Main Topics  . Smoking status: Current Some Day Smoker -- 1.00 packs/day for 10 years    Types: Cigarettes  . Smokeless tobacco: Never Used  . Alcohol Use: No  . Drug Use: No  . Sexual Activity:    Partners: Male   Other Topics Concern  . None   Social History Narrative   Lives with husband.   Caffeine Use: 40mg /day   Family History  Problem Relation Age of Onset  . Asthma Mother     "allergy induced"  . Breast cancer Maternal Grandmother   . Colon cancer Maternal Grandfather   . Hyperlipidemia Other   . Hypertension Other   . Obesity Other   . Heart disease Other   . Von Willebrand disease Sister    Allergies  Allergen Reactions  . Lamictal [Lamotrigine] Anaphylaxis  . Hydrocodone Nausea Only    Tolerates codeine  . Vancomycin Other (See Comments)    Feels hot   Prior to Admission medications   Medication Sig Start Date End Date Taking? Authorizing Provider  amphetamine-dextroamphetamine (ADDERALL) 20 MG tablet Take 1 tablet (20 mg total) by mouth 2 (two) times daily. 05/13/15  Yes Leandrew Koyanagi, MD  citalopram (CELEXA) 40 MG tablet Take 0.5 tablets (20 mg total) by mouth daily. 07/28/14  Yes Leandrew Koyanagi, MD  clonazePAM (KLONOPIN) 0.5 MG tablet Take 0.5 mg by mouth 3 (three) times daily as needed for anxiety.   Yes Historical Provider, MD  albuterol (PROVENTIL HFA;VENTOLIN HFA) 108 (90 BASE) MCG/ACT inhaler Inhale 2 puffs into the lungs every 4 (four) hours as needed for wheezing or shortness of breath (cough, shortness of breath or wheezing.). Patient  not taking: Reported on 05/25/2015 12/03/14   Shawnee Knapp, MD  amoxicillin-clavulanate (AUGMENTIN) 875-125 MG tablet Take 1 tablet by mouth 2 (two) times daily. Patient not taking: Reported on 05/25/2015 04/29/15   Alycia Rossetti, MD  guaiFENesin-codeine 100-10 MG/5ML syrup Take 5 mLs by mouth every 6 (six) hours as needed. Patient not taking: Reported on 05/25/2015 04/29/15   Alycia Rossetti, MD  Multiple Vitamin (MULTIVITAMIN WITH MINERALS) TABS tablet Take 1 tablet by mouth daily. Reported on 05/25/2015    Historical Provider, MD  predniSONE (DELTASONE) 20 MG tablet Take 2 tablets (40 mg total) by mouth daily with breakfast. Patient not taking: Reported on 05/25/2015 04/29/15   Alycia Rossetti, MD     ROS: The  patient denies fevers, unintentional weight loss, chest pain, palpitations, wheezing, dyspnea on exertion, dysuria, hematuria, melena, numbness, weakness, or tingling.   All other systems have been reviewed and were otherwise negative with the exception of those mentioned in the HPI and as above.    PHYSICAL EXAM: Filed Vitals:   05/25/15 0802  BP: 110/62  Pulse: 111  Temp: 98.5 F (36.9 C)   There is no weight on file to calculate BMI.   General: Patient lying in bed moaning. Complaining of generalized myalgias and abdominal cramps. HEENT:  Normocephalic, atraumatic, oropharynx patent. Tongue slightly dry. Eye: Juliette Mangle [facility] Cardiovascular: Tachycardic. No murmur. Respiratory: Clear to auscultation bilaterally.  No wheezes, rales, or rhonchi.  No cyanosis, no use of accessory musculature Abdominal: Flat, not distended. Bowel sounds present with diffuse abdominal tenderness. No rebound. Musculoskeletal: Gait intact. Skin: No rashes. Neurologic: Facial musculature symmetric. Psychiatric: Patient acts appropriately throughout our interaction. Lymphatic: No cervical or submandibular lymphadenopathy    LABS:  Results for orders placed or performed in visit on [id number]  BASIC METABOLIC PANEL WITH GFR  Result Value Ref Range   Sodium 140 135 - 146 mmol/L   Potassium 4.4 3.5 - 5.3 mmol/L   Chloride 104 98 - 110 mmol/L   CO2 25 20 - 31 mmol/L   Glucose, Bld 119 (H) 65 - 99 mg/dL   BUN 10 7 - 25 mg/dL   Creat 0.80 0.50 - 1.10 mg/dL   Calcium 9.0 8.6 - 10.2 mg/dL   GFR, Est African American >89 >=60 mL/min   GFR, Est Non African American >89 >=60 mL/min  POCT CBC  Result Value Ref Range   WBC 10.3 (A) 4.6 - 10.2 K/uL   Lymph, poc 0.6 0.6 - 3.4   POC LYMPH PERCENT 5.4 (A) 10 - 50 %L   MID (cbc) 0.2 0 - 0.9   POC MID % 2.3 0 - 12 %M   POC Granulocyte 9.5 (A) 2 - 6.9   Granulocyte percent 92.3 (A) 37 - 80 %G   RBC 5.02 4.04 - 5.48 M/uL   Hemoglobin 15.2 12.2 - 16.2 g/dL    HCT, POC 43.4 37.7 - 47.9 %   MCV 86.4 80 - 97 fL   MCH, POC 30.2 27 - 31.2 pg   MCHC 34.9 31.8 - 35.4 g/dL   RDW, POC 13.1 %   Platelet Count, POC 178 142 - 424 K/uL   MPV 6.3 0 - 99.8 fL    EKG/XRAY:   Primary read interpreted by Dr. at [facility]   ASSESSMENT/PLAN:  patient presents with the acute onset of nausea vomiting and diarrhea. Multiple family members have been ill with this illness. She was treated with IV fluids injectable Phenergan and transported to  the emergency room for further evaluation.  Gross sideeffects, risk and benefits, and alternatives of medications d/w patient. Patient is aware that all medications have potential sideeffects and we are unable to predict every sideeffect or drug-drug interaction that may occur.  Arlyss Queen MD 05/25/2015 8:07 AM

## 2015-05-26 LAB — GASTROINTESTINAL PATHOGEN PANEL PCR
C. difficile Tox A/B, PCR: NEGATIVE
Campylobacter, PCR: NEGATIVE
Cryptosporidium, PCR: NEGATIVE
E coli (ETEC) LT/ST PCR: NEGATIVE
E coli (STEC) stx1/stx2, PCR: NEGATIVE
E coli 0157, PCR: NEGATIVE
Giardia lamblia, PCR: NEGATIVE
Norovirus, PCR: POSITIVE — CR
Rotavirus A, PCR: NEGATIVE
Salmonella, PCR: NEGATIVE
Shigella, PCR: NEGATIVE

## 2015-06-02 MED FILL — CITALOPRAM HBR 40 MG TABLET: 40 | 90 days supply | Qty: 45 | Fill #1

## 2015-06-02 MED FILL — clonazePAM 0.5 MG TABS: 0.5 | 10 days supply | Qty: 30 | Fill #1

## 2015-06-06 ENCOUNTER — Telehealth: Payer: Self-pay | Admitting: Emergency Medicine

## 2015-06-06 NOTE — Telephone Encounter (Signed)
FMLA forms completed for patient. Please sign and return.  Thanks, [employer]

## 2015-06-16 ENCOUNTER — Telehealth: Payer: Self-pay | Admitting: Emergency Medicine

## 2015-06-16 NOTE — Telephone Encounter (Signed)
I called [employer] to follow up about this patient's FMLA. They state that the patient's FMLA has not been denied but rather it is still pending. They state that the patient is not covered under FMLA because she was only out of work for 3 days. In order for her to be covered under FMLA she has to come in for a follow up appointment with Dr. Everlene Farrier or another provider in our facility. This information was never communicated to the patient. I have asked [employer] to fax another form over so that it can be updated.   Thanks, [employer]

## 2015-06-16 NOTE — Telephone Encounter (Signed)
Tell Terri Wilson she may need to come by and see me for a quick visit so we can complete the forms so she does not get into trouble.

## 2015-06-17 NOTE — Telephone Encounter (Signed)
Advised pt on VM. 

## 2015-06-29 ENCOUNTER — Other Ambulatory Visit: Payer: Self-pay

## 2015-06-29 MED ORDER — AMPHETAMINE-DEXTROAMPHETAMINE 20 MG PO TABS
20.0000 mg | ORAL_TABLET | Freq: Two times a day (BID) | ORAL | Status: DC
Start: 2015-06-29 — End: 2015-08-09

## 2015-06-29 NOTE — Telephone Encounter (Signed)
Pt in need of her ADDERALL 20 MG. Please call [phone] when ready for pick up

## 2015-06-30 MED FILL — AMPHETAMINE SALTS 20 MG TAB: 20 | 30 days supply | Qty: 60 | Fill #0

## 2015-06-30 NOTE — Telephone Encounter (Signed)
Notified pt ready on VM

## 2015-07-15 ENCOUNTER — Ambulatory Visit (INDEPENDENT_AMBULATORY_CARE_PROVIDER_SITE_OTHER): Payer: 59 | Admitting: Family Medicine

## 2015-07-15 ENCOUNTER — Encounter: Payer: Self-pay | Admitting: Family Medicine

## 2015-07-15 VITALS — BP 120/74 | HR 78 | Temp 98.7°F | Resp 16 | Ht 68.0 in | Wt 122.0 lb

## 2015-07-15 DIAGNOSIS — F329 Major depressive disorder, single episode, unspecified: Secondary | ICD-10-CM

## 2015-07-15 DIAGNOSIS — D489 Neoplasm of uncertain behavior, unspecified: Secondary | ICD-10-CM | POA: Diagnosis not present

## 2015-07-15 DIAGNOSIS — F32A Depression, unspecified: Secondary | ICD-10-CM

## 2015-07-15 DIAGNOSIS — D239 Other benign neoplasm of skin, unspecified: Secondary | ICD-10-CM | POA: Diagnosis not present

## 2015-07-15 MED ORDER — CITALOPRAM HYDROBROMIDE 40 MG PO TABS: 40.0000 mg | ORAL_TABLET | Freq: Every day | ORAL | Status: DC

## 2015-07-15 MED FILL — CITALOPRAM HBR 40 MG TABLET: 40 | 90 days supply | Qty: 90 | Fill #0

## 2015-07-15 NOTE — Progress Notes (Signed)
Patient ID: Terri Wilson, female   DOB: 1986-05-02, 30 y.o.   MRN: [id number]  After visit, patient requested refill on Celexa.   States that she has been taking whole tablet of Celexa 40mg , and has had no reactions.

## 2015-07-15 NOTE — Addendum Note (Signed)
Addended by: Sheral Flow on: 07/15/2015 04:26 PM   Modules accepted: Orders

## 2015-07-15 NOTE — Progress Notes (Signed)
Patient ID: Terri Wilson, female   DOB: 1985/10/10, 30 y.o.   MRN: [id number]   Subjective:    Patient ID: Terri Wilson, female    DOB: 12/13/1985, 30 y.o.   MRN: [id number]  Patient presents for Irregular mole to L upper arm  Patient with concern for cancerous mole. She has family history in her mother and her sister as well as paternal grandmother of melanoma. She has had basal cell carcinoma removed off of her skin. She has multiple moles as well as seborrheic keratoses. She's not followed up with her dermatologist in the past year. She has a dark black mole on her left upper arm which is been present for greater than a year but it seems to be increasing in size- although in general it is quite small. She states that her mother and her sister's melanoma started this tiny.    Review Of Systems:  GEN- denies fatigue, fever, weight loss,weakness, recent illness HEENT- denies eye drainage, change in vision, nasal discharge, CVS- denies chest pain, palpitations RESP- denies SOB, cough, wheeze Neuro- denies headache, dizziness, syncope, seizure activity       Objective:    BP 120/74 mmHg  Pulse 78  Temp(Src) 98.7 F (37.1 C) (Oral)  Resp 16  Ht 5\' 8"  (1.727 m)  Wt 122 lb (55.339 kg)  BMI 18.55 kg/m2  LMP 07/13/2015 GEN- NAD, alert and oriented x3 Skin- Multiple flesh colored and brown moles/ scattered seborrhiec keratosis, mid back- prominent brown macule, no irregularity - not raised Left upper arm- black mole with liner growth from top < 19mm size   Procedure- Shave biopsy  Procedure explained to patient questions answered benefits and risks discussed verbal consent obtained. Antiseptic-Betadine  Anesthesia-lidocaine 1% with Epi  Shave biopsy with razor, entire lesion removed  Minimal blood loss, drysol touched to lesion  Pathology sent  Patient tolerated procedure well Bandage applied         Assessment & Plan:      Problem List Items Addressed This Visit     None    Visit Diagnoses    Neoplasm of uncertain behavior    -  Primary    irregular mole and family and personal history of cancer, sent for pathology, needs mole surveillence every 6 months with dermatology    Relevant Orders    Pathology Report       Note: This dictation was prepared with Dragon dictation along with smaller phrase technology. Any transcriptional errors that result from this process are unintentional.

## 2015-07-15 NOTE — Patient Instructions (Signed)
We will call with pathology results Call for any signs of infection F/U as needed

## 2015-07-18 LAB — PATHOLOGY

## 2015-08-09 ENCOUNTER — Encounter: Payer: Self-pay | Admitting: Family Medicine

## 2015-08-09 ENCOUNTER — Ambulatory Visit (INDEPENDENT_AMBULATORY_CARE_PROVIDER_SITE_OTHER): Payer: 59 | Admitting: Family Medicine

## 2015-08-09 VITALS — BP 118/58 | HR 84 | Temp 98.9°F | Resp 14 | Ht 68.0 in | Wt 122.0 lb

## 2015-08-09 DIAGNOSIS — F909 Attention-deficit hyperactivity disorder, unspecified type: Secondary | ICD-10-CM

## 2015-08-09 DIAGNOSIS — R0789 Other chest pain: Secondary | ICD-10-CM

## 2015-08-09 DIAGNOSIS — R002 Palpitations: Secondary | ICD-10-CM

## 2015-08-09 DIAGNOSIS — F988 Other specified behavioral and emotional disorders with onset usually occurring in childhood and adolescence: Secondary | ICD-10-CM

## 2015-08-09 DIAGNOSIS — J0100 Acute maxillary sinusitis, unspecified: Secondary | ICD-10-CM

## 2015-08-09 LAB — COMPREHENSIVE METABOLIC PANEL
ALT: 10 U/L (ref 6–29)
AST: 13 U/L (ref 10–30)
Albumin: 4.2 g/dL (ref 3.6–5.1)
Alkaline Phosphatase: 61 U/L (ref 33–115)
BUN: 8 mg/dL (ref 7–25)
CO2: 23 mmol/L (ref 20–31)
Calcium: 8.9 mg/dL (ref 8.6–10.2)
Chloride: 104 mmol/L (ref 98–110)
Creat: 0.6 mg/dL (ref 0.50–1.10)
Glucose, Bld: 93 mg/dL (ref 70–99)
Potassium: 4.4 mmol/L (ref 3.5–5.3)
Sodium: 137 mmol/L (ref 135–146)
Total Bilirubin: 0.3 mg/dL (ref 0.2–1.2)
Total Protein: 6.7 g/dL (ref 6.1–8.1)

## 2015-08-09 LAB — CBC WITH DIFFERENTIAL/PLATELET
Basophils Absolute: 0 10*3/uL (ref 0.0–0.1)
Basophils Relative: 0 % (ref 0–1)
Eosinophils Absolute: 0.1 10*3/uL (ref 0.0–0.7)
Eosinophils Relative: 1 % (ref 0–5)
HCT: 40 % (ref 36.0–46.0)
Hemoglobin: 13.6 g/dL (ref 12.0–15.0)
Lymphocytes Relative: 26 % (ref 12–46)
Lymphs Abs: 2.3 10*3/uL (ref 0.7–4.0)
MCH: 30.3 pg (ref 26.0–34.0)
MCHC: 34 g/dL (ref 30.0–36.0)
MCV: 89.1 fL (ref 78.0–100.0)
MPV: 9.2 fL (ref 8.6–12.4)
Monocytes Absolute: 0.8 10*3/uL (ref 0.1–1.0)
Monocytes Relative: 9 % (ref 3–12)
Neutro Abs: 5.6 10*3/uL (ref 1.7–7.7)
Neutrophils Relative %: 64 % (ref 43–77)
Platelets: 170 10*3/uL (ref 150–400)
RBC: 4.49 MIL/uL (ref 3.87–5.11)
RDW: 13.4 % (ref 11.5–15.5)
WBC: 8.7 10*3/uL (ref 4.0–10.5)

## 2015-08-09 LAB — TSH: TSH: 1.78 mIU/L

## 2015-08-09 MED ORDER — AMPHETAMINE-DEXTROAMPHETAMINE 10 MG PO TABS: 10.0000 mg | ORAL_TABLET | Freq: Two times a day (BID) | ORAL | Status: DC

## 2015-08-09 MED ORDER — AMOXICILLIN 875 MG PO TABS: 875.0000 mg | ORAL_TABLET | Freq: Two times a day (BID) | ORAL | Status: DC

## 2015-08-09 MED FILL — AMOXICILLIN 875 MG TABLET: 875 | 10 days supply | Qty: 20 | Fill #0

## 2015-08-09 NOTE — Patient Instructions (Addendum)
Referral to cardiology Take antibiotics as prescribed  We will call with lab results Adderall decreased to 10mg  F/U pending results

## 2015-08-09 NOTE — Progress Notes (Signed)
Patient ID: Terri Wilson, female   DOB: 02-14-1986, 30 y.o.   MRN: [id number]    Subjective:    Patient ID: Terri Wilson, female    DOB: 11/13/1985, 29 y.o.   MRN: [id number]  Patient presents for Illness Patient here for acute illness however she wanted to multiple concerns. She's been having some sinus pressure and drainage which has not been improving she's had low-grade fevers and chills of fatigue she has had her flu shot. She's not had any significant cough or postnasal drip. She has used some over-the-counter medications with mild improvement.  She then went on to say that she's been having difficulty with her weight overall fatigue which affects her mood for the past few months. She has been on Celexa for a few years and has done well with this. She is also on Adderall 20 mg twice a day which was given by her primary care provider. This medication she notes deathly decreases her appetite but she has noted that her hair is falling out and she continues to drop weight. Over year ago she was in the 140s she is down to 120s. She's had multiple illnesses back to back as well. When I reviewed her ER chart she was in the ER a month or so ago that time with GI virus that she had hypocalcemia noted as well as low protein levels. She states that often she will skip meals and she has found herself smoking more as well. Her family is getting concerned. She also has in that she has recurrent episodes of palpitations and at times she does get severe heavy chest pressure which she is actually had a few times this past couple weeks. She does not have any nausea vomiting associated no diaphoresis. She was seen by cardiology in the past she had echo done which was normal she also wore a Holter Holter monitor which revealed recurrent PVCs but this was around the time that she found out she was pregnant therefore should not follow up.    Review Of Systems:  GEN- + fatigue, fever, weight loss,weakness,  recent illness HEENT- denies eye drainage, change in vision, nasal discharge, CVS- denies chest pain, palpitations RESP- denies SOB, cough, wheeze ABD- denies N/V, change in stools, abd pain GU- denies dysuria, hematuria, dribbling, incontinence MSK- denies joint pain, muscle aches, injury Neuro- denies headache, dizziness, syncope, seizure activity       Objective:    BP 118/58 mmHg  Pulse 84  Temp(Src) 98.9 F (37.2 C) (Oral)  Resp 14  Ht 5\' 8"  (1.727 m)  Wt 122 lb (55.339 kg)  BMI 18.55 kg/m2  LMP 07/13/2015 GEN- NAD, alert and oriented x3 HEENT- PERRL, EOMI, non injected sclera, pink conjunctiva, MMM, oropharynx mild injection, TM clear bilat no effusion,  + maxillary sinus tenderness, inflammed turbinates,  Nasal drainage  Neck- Supple, no LAD CVS- RRR, no murmur RESP-CTAB EXT- No edema Pulses- Radial 2+  EKG- NSR, PVC      Assessment & Plan:      Problem List Items Addressed This Visit    Other chest pain    She has palpitations, family hsitory of early CAD/MI, needs stress testing, refer back to cardiology, check labs. Stress also contributing, she had has normal thyroid function studies in the past. It is possible that her Adderall is causing her symptoms. I will also decrease this to 10 mg twice a day she is adamant that she cannot function without her stimulant medication which she's  been on for years. She's tried others in the past may cause side effects.      Relevant Orders   EKG 12-Lead (Completed)   CBC with Differential/Platelet (Completed)   Comprehensive metabolic panel (Completed)   Ambulatory referral to Cardiology   ADD (attention deficit disorder)    Decreased Adderall per above. Also reiterated that she must make herself eat she cannot go throughout the day without eating of this will also call symptoms of fatigue and nutritional deficiencies       Other Visit Diagnoses    Hypocalcemia    -  Primary    Recheck calcium and PTH levels, TSH     Relevant Orders    PTH, Intact and Calcium (Completed)    TSH (Completed)    Acute maxillary sinusitis, recurrence not specified        Treat with amoxicillin, Flonase, mucinex     Relevant Medications    amoxicillin (AMOXIL) 875 MG tablet    Palpitations        Relevant Orders    Ambulatory referral to Cardiology       Note: This dictation was prepared with Dragon dictation along with smaller phrase technology. Any transcriptional errors that result from this process are unintentional.

## 2015-08-10 LAB — PTH, INTACT AND CALCIUM
Calcium: 8.9 mg/dL (ref 8.4–10.5)
PTH: 25 pg/mL (ref 14–64)

## 2015-08-10 NOTE — Assessment & Plan Note (Signed)
Decreased Adderall per above. Also reiterated that she must make herself eat she cannot go throughout the day without eating of this will also call symptoms of fatigue and nutritional deficiencies

## 2015-08-10 NOTE — Assessment & Plan Note (Signed)
She has palpitations, family hsitory of early CAD/MI, needs stress testing, refer back to cardiology, check labs. Stress also contributing, she had has normal thyroid function studies in the past. It is possible that her Adderall is causing her symptoms. I will also decrease this to 10 mg twice a day she is adamant that she cannot function without her stimulant medication which she's been on for years. She's tried others in the past may cause side effects.

## 2015-08-16 DIAGNOSIS — I493 Ventricular premature depolarization: Secondary | ICD-10-CM | POA: Diagnosis not present

## 2015-08-16 DIAGNOSIS — R0789 Other chest pain: Secondary | ICD-10-CM | POA: Diagnosis not present

## 2015-08-16 DIAGNOSIS — R42 Dizziness and giddiness: Secondary | ICD-10-CM | POA: Diagnosis not present

## 2015-08-16 DIAGNOSIS — R0602 Shortness of breath: Secondary | ICD-10-CM | POA: Diagnosis not present

## 2015-08-22 DIAGNOSIS — R42 Dizziness and giddiness: Secondary | ICD-10-CM | POA: Diagnosis not present

## 2015-08-25 DIAGNOSIS — R0609 Other forms of dyspnea: Secondary | ICD-10-CM | POA: Diagnosis not present

## 2015-08-26 DIAGNOSIS — R0789 Other chest pain: Secondary | ICD-10-CM | POA: Diagnosis not present

## 2015-09-05 DIAGNOSIS — R0789 Other chest pain: Secondary | ICD-10-CM | POA: Diagnosis not present

## 2015-09-05 DIAGNOSIS — I493 Ventricular premature depolarization: Secondary | ICD-10-CM | POA: Diagnosis not present

## 2015-09-05 DIAGNOSIS — F419 Anxiety disorder, unspecified: Secondary | ICD-10-CM | POA: Diagnosis not present

## 2015-09-05 DIAGNOSIS — R42 Dizziness and giddiness: Secondary | ICD-10-CM | POA: Diagnosis not present

## 2015-09-06 MED FILL — METOPROLOL SUCC ER 25 MG TA: 25 | 30 days supply | Qty: 15 | Fill #0

## 2015-09-08 MED FILL — IBUPROFEN 800 MG TABLET: 800 | 4 days supply | Qty: 16 | Fill #0

## 2015-09-08 MED FILL — HYDROCODON-APAP 5-325: 5-325 | 3 days supply | Qty: 16 | Fill #0

## 2015-09-13 ENCOUNTER — Telehealth: Payer: Self-pay | Admitting: Internal Medicine

## 2015-09-13 NOTE — Telephone Encounter (Signed)
PATIENT REQUESTING RX FOR HER ADDERALL  [id number] IF ANY QUESTIONS

## 2015-09-15 MED ORDER — AMPHETAMINE-DEXTROAMPHETAMINE 10 MG PO TABS: 10.0000 mg | ORAL_TABLET | Freq: Two times a day (BID) | ORAL | Status: DC

## 2015-09-15 NOTE — Telephone Encounter (Signed)
Prescription printed and patient made aware to pick up on 09/16/2015.  Aware to F/U with md in 2 weeks.

## 2015-09-15 NOTE — Telephone Encounter (Signed)
Ok to refill??  Last office visit/ refill 08/09/2015. 

## 2015-09-15 NOTE — Telephone Encounter (Signed)
Okay to refill, schedule F/U within next 2 weeks

## 2015-09-21 ENCOUNTER — Ambulatory Visit (INDEPENDENT_AMBULATORY_CARE_PROVIDER_SITE_OTHER): Payer: 59 | Admitting: Family Medicine

## 2015-09-21 ENCOUNTER — Encounter: Payer: Self-pay | Admitting: Family Medicine

## 2015-09-21 VITALS — BP 128/78 | HR 98 | Temp 98.6°F | Resp 16 | Ht 68.0 in | Wt 125.0 lb

## 2015-09-21 DIAGNOSIS — I493 Ventricular premature depolarization: Secondary | ICD-10-CM | POA: Diagnosis not present

## 2015-09-21 DIAGNOSIS — F329 Major depressive disorder, single episode, unspecified: Secondary | ICD-10-CM

## 2015-09-21 DIAGNOSIS — F418 Other specified anxiety disorders: Secondary | ICD-10-CM | POA: Diagnosis not present

## 2015-09-21 DIAGNOSIS — F988 Other specified behavioral and emotional disorders with onset usually occurring in childhood and adolescence: Secondary | ICD-10-CM

## 2015-09-21 DIAGNOSIS — F909 Attention-deficit hyperactivity disorder, unspecified type: Secondary | ICD-10-CM | POA: Diagnosis not present

## 2015-09-21 DIAGNOSIS — F32A Depression, unspecified: Secondary | ICD-10-CM

## 2015-09-21 DIAGNOSIS — F419 Anxiety disorder, unspecified: Secondary | ICD-10-CM

## 2015-09-21 MED ORDER — METOPROLOL SUCCINATE ER 25 MG PO TB24: 12.5000 mg | ORAL_TABLET | Freq: Every day | ORAL | Status: DC

## 2015-09-21 NOTE — Patient Instructions (Signed)
Try the metroprolol once a day  Continue adderall  F/U 3 months

## 2015-09-21 NOTE — Assessment & Plan Note (Signed)
Multiple PVCs noted on her cardiac workup. She hash at the stopper treadmill testing secondary to PVCs and shortness of breath. Thinks she may get some benefit out of a low-dose of beta blocker or get possibly use a calcium channel blocker. She is willing to try 12.5 mg I will have her take this a few hours after her Adderall

## 2015-09-21 NOTE — Progress Notes (Signed)
Patient ID: Terri Wilson, female   DOB: 16-Mar-1986, 30 y.o.   MRN: [id number]    Subjective:    Patient ID: Terri Wilson, female    DOB: [birth year], 30 y.o.   MRN: [id number]  Patient presents for F/U Patient here for follow-up. She was seen about 6 weeks ago at that time was having increased stress anxiety decreased appetite she had been losing weight over a period of time she also been having chest pains. For further her back to cardiologist who she had seen a few years ago. She has echocardiogram and stress testing done which were unremarkable his diagnosis was more anxiety. With regards To her appetite I decreased her Adderall dose to 10 mg twice a day. Since then her appetite has improved she is put some weight back: She states that she feels better about the way she looks and how she's been eating. Also she does not feel is revved up. She does notice a difference as far as her concentration on her job with the lower dose. At times she would just take 20 mg in the morning other time she would take 10 mg twice a day then she started to spread it out because she is afraid to ask for refill on the medication.  Her Holter monitor showed some increased PVC she was started on a low-dose beta blocker should not tolerate beta blockers very well in the past. She was given metoprolol 12.5 mg daily But she's been afraid to take this medication. She does notice the palpitations and on one hand 1 stitches work through them without taking any meds other meds.  She is still on her Celexa and Klonopin    Review Of Systems:  GEN- denies fatigue, fever, weight loss,weakness, recent illness HEENT- denies eye drainage, change in vision, nasal discharge, CVS- denies chest pain, palpitations RESP- denies SOB, cough, wheeze ABD- denies N/V, change in stools, abd pain GU- denies dysuria, hematuria, dribbling, incontinence MSK- denies joint pain, muscle aches, injury Neuro- denies headache, dizziness,  syncope, seizure activity       Objective:    BP 128/78 mmHg  Pulse 98  Temp(Src) 98.6 F (37 C) (Oral)  Resp 16  Ht 5\' 8"  (1.727 m)  Wt 125 lb (56.7 kg)  BMI 19.01 kg/m2 GEN- NAD, alert and oriented x3 HEENT- PERRL, EOMI, non injected sclera, pink conjunctiva, MMM, oropharynx clear Neck- Supple, no thyromegaly CVS- RRR, no murmur RESP-CTAB, few PVC Psych- not anxious appearing, normal affect and mood          Assessment & Plan:      Problem List Items Addressed This Visit    PVC (premature ventricular contraction)    Multiple PVCs noted on her cardiac workup. She hash at the stopper treadmill testing secondary to PVCs and shortness of breath. Thinks she may get some benefit out of a low-dose of beta blocker or get possibly use a calcium channel blocker. She is willing to try 12.5 mg I will have her take this a few hours after her Adderall      Relevant Medications   metoprolol succinate (TOPROL-XL) 25 MG 24 hr tablet   Anxiety and depression - Primary    Generalized anxiety as well as her attention deficit disorder are playing a role in her issues here. I think these are also causing her palpitations in agreement with the cardiologist. For now many keep her on the 10 mg twice a day we will plan to try extended release  if she still feels like she is not getting enough medication the morning. 20 mg extended release Adderall. She will continue her Celexa she very rarely uses the clonazepam.      ADD (attention deficit disorder)    Other Visit Diagnoses    Adjustment disorder with mixed anxiety and depressed mood           Note: This dictation was prepared with Dragon dictation along with smaller phrase technology. Any transcriptional errors that result from this process are unintentional.

## 2015-09-21 NOTE — Assessment & Plan Note (Signed)
Generalized anxiety as well as her attention deficit disorder are playing a role in her issues here. I think these are also causing her palpitations in agreement with the cardiologist. For now many keep her on the 10 mg twice a day we will plan to try extended release if she still feels like she is not getting enough medication the morning. 20 mg extended release Adderall. She will continue her Celexa she very rarely uses the clonazepam.

## 2015-09-30 NOTE — Telephone Encounter (Signed)
close

## 2015-10-12 ENCOUNTER — Telehealth: Payer: Self-pay | Admitting: *Deleted

## 2015-10-12 ENCOUNTER — Ambulatory Visit (INDEPENDENT_AMBULATORY_CARE_PROVIDER_SITE_OTHER): Payer: 59 | Admitting: Family Medicine

## 2015-10-12 VITALS — BP 112/64 | HR 100 | Temp 98.5°F | Resp 16 | Ht 68.0 in | Wt 126.6 lb

## 2015-10-12 DIAGNOSIS — F329 Major depressive disorder, single episode, unspecified: Secondary | ICD-10-CM

## 2015-10-12 DIAGNOSIS — F32A Depression, unspecified: Secondary | ICD-10-CM

## 2015-10-12 MED ORDER — BUPROPION HCL ER (XL) 150 MG PO TB24: 150.0000 mg | ORAL_TABLET | Freq: Every day | ORAL | Status: DC

## 2015-10-12 MED ORDER — CITALOPRAM HYDROBROMIDE 20 MG PO TABS: 30.0000 mg | ORAL_TABLET | Freq: Every day | ORAL | Status: DC

## 2015-10-12 MED ORDER — CLONAZEPAM 0.5 MG PO TABS: 0.5000 mg | ORAL_TABLET | Freq: Every evening | ORAL | Status: DC | PRN

## 2015-10-12 NOTE — Telephone Encounter (Signed)
Called prescription to [facility] for clonazepam 0.5 mg spoke to [employer], per Dr Tamala Julian.

## 2015-10-12 NOTE — Patient Instructions (Signed)
     IF you received an x-ray today, you will receive an invoice from [facility]. Please contact [facility] at [phone] with questions or concerns regarding your invoice.   IF you received labwork today, you will receive an invoice from [facility]. Please contact [facility] at [phone] with questions or concerns regarding your invoice.   Our billing staff will not be able to assist you with questions regarding bills from these companies.  You will be contacted with the lab results as soon as they are available. The fastest way to get your results is to activate your My Chart account. Instructions are located on the last page of this paperwork. If you have not heard from us regarding the results in 2 weeks, please contact this office.      

## 2015-10-12 NOTE — Progress Notes (Signed)
Subjective:    Patient ID: Terri Wilson, female    DOB: 27-Dec-1985, 30 y.o.   MRN: [id number]  10/12/2015  PT preferred to discuss with provider   HPI This 30 y.o. female presents for evaluation of acute worsening depressive symptoms.  Onset in the past several months.  Major work stressors that is greatly impacting mood.  Dreads going to work.  Office needs a lot of improvement with team building; pt has been really motivated to help with team building. Several negative team members who are really discouraging.   Pt hides depressive symptoms from husband, son, mother.  Does not want people to worry. No SI but sometimes would prefer not to wake up.  Chronic battle with depression.   Citalopram '40mg'$  daily.  Same dose for years.  '40mg'$  is for past six months.   Uses Klonopin qhs once per week if not sleeping.   No SI.  Disrupted sleep.  Sporadic.   Lost a lot of weight over past six months; working on increasing weight. Not exercising but working in the yard.  Build things; playing with [name] in yard; two year old.   Holter monitor, cardiac issues; PVCs; tried B blocker but makes tired.    Taking 2 days off due to major work stressors.  Working closely with HR.  HR insisted MD visit today.  Lexapro works well. Wellbutrin did not work well; used Wellbutrin with Lexapro at one time; a long time ago.  No previous Zoloft or Prozac.    Klonopin as needed; no daytime use of Klonopin.   Adderall '10mg'$  bid.  Lowered dose to '10mg'$  to help with weight gain.  Fine for now.  Work Biomedical scientist.    Review of Systems  Constitutional: Positive for unexpected weight change. Negative for fever, chills, diaphoresis and fatigue.  Eyes: Negative for visual disturbance.  Respiratory: Negative for cough and shortness of breath.   Cardiovascular: Negative for chest pain, palpitations and leg swelling.  Gastrointestinal: Negative for nausea, vomiting, abdominal pain, diarrhea and constipation.    Endocrine: Negative for cold intolerance, heat intolerance, polydipsia, polyphagia and polyuria.  Neurological: Negative for dizziness, tremors, seizures, syncope, facial asymmetry, speech difficulty, weakness, light-headedness, numbness and headaches.  Psychiatric/Behavioral: Positive for dysphoric mood. Negative for suicidal ideas, sleep disturbance and self-injury.    Past Medical History  Diagnosis Date  . Anxiety and depression   . Panic attack   . Herpes labialis   . Migraine   . Allergic rhinitis   . Chiari malformation     7 mm  . Depression   . Gestational diabetes mellitus, antepartum   . Gestational diabetes    Past Surgical History  Procedure Laterality Date  . Tonsillectomy and adenoidectomy  2001  . Wisdom tooth extraction     Allergies  Allergen Reactions  . Lamictal [Lamotrigine] Anaphylaxis  . Hydrocodone Nausea Only    Tolerates codeine  . Vancomycin Other (See Comments)    Feels hot    Social History   Social History  . Marital Status: Married    Spouse Name: [name]  . Number of Children: 1  . Years of Education: 14   Occupational History  . BUSINESS ASSISTANT [employer]    [employer]   Social History Main Topics  . Smoking status: Current Some Day Smoker -- 1.00 packs/day for 10 years    Types: Cigarettes  . Smokeless tobacco: Never Used  . Alcohol Use: No  . Drug Use: No  . Sexual  Activity:    Partners: Male   Other Topics Concern  . Not on file   Social History Narrative   Lives with husband.   Caffeine Use: '40mg'$ /day   Family History  Problem Relation Age of Onset  . Asthma Mother     "allergy induced"  . Breast cancer Maternal Grandmother   . Colon cancer Maternal Grandfather   . Hyperlipidemia Other   . Hypertension Other   . Obesity Other   . Heart disease Other   . Von Willebrand disease Sister   . Heart disease Father 55    first heart attack   . Heart disease Paternal Aunt        Objective:    BP 112/64 mmHg   Pulse 100  Temp(Src) 98.5 F (36.9 C) (Oral)  Resp 16  Ht '5\' 8"'$  (1.727 m)  Wt 126 lb 9.6 oz (57.425 kg)  BMI 19.25 kg/m2  SpO2 99%  LMP 09/11/2015 Physical Exam  Constitutional: She is oriented to person, place, and time. She appears well-developed and well-nourished. No distress.  HENT:  Head: Normocephalic and atraumatic.  Right Ear: External ear normal.  Left Ear: External ear normal.  Nose: Nose normal.  Mouth/Throat: Oropharynx is clear and moist.  Eyes: Conjunctivae and EOM are normal. Pupils are equal, round, and reactive to light.  Neck: Normal range of motion. Neck supple. Carotid bruit is not present. No thyromegaly present.  Cardiovascular: Normal rate, regular rhythm, normal heart sounds and intact distal pulses.  Exam reveals no gallop and no friction rub.   No murmur heard. Pulmonary/Chest: Effort normal and breath sounds normal. She has no wheezes. She has no rales.  Abdominal: Soft. Bowel sounds are normal. She exhibits no distension and no mass. There is no tenderness. There is no rebound and no guarding.  Lymphadenopathy:    She has no cervical adenopathy.  Neurological: She is alert and oriented to person, place, and time. No cranial nerve deficit.  Skin: Skin is warm and dry. No rash noted. She is not diaphoretic. No erythema. No pallor.  Psychiatric: Her speech is normal and behavior is normal. Judgment and thought content normal. Thought content is not delusional. Cognition and memory are normal. She exhibits a depressed mood. She expresses no suicidal plans and no homicidal plans.  Tearful.  Well-groomed.   Results for orders placed or performed in visit on 08/09/15  CBC with Differential/Platelet  Result Value Ref Range   WBC 8.7 4.0 - 10.5 K/uL   RBC 4.49 3.87 - 5.11 MIL/uL   Hemoglobin 13.6 12.0 - 15.0 g/dL   HCT 40.0 36.0 - 46.0 %   MCV 89.1 78.0 - 100.0 fL   MCH 30.3 26.0 - 34.0 pg   MCHC 34.0 30.0 - 36.0 g/dL   RDW 13.4 11.5 - 15.5 %   Platelets  170 150 - 400 K/uL   MPV 9.2 8.6 - 12.4 fL   Neutrophils Relative % 64 43 - 77 %   Neutro Abs 5.6 1.7 - 7.7 K/uL   Lymphocytes Relative 26 12 - 46 %   Lymphs Abs 2.3 0.7 - 4.0 K/uL   Monocytes Relative 9 3 - 12 %   Monocytes Absolute 0.8 0.1 - 1.0 K/uL   Eosinophils Relative 1 0 - 5 %   Eosinophils Absolute 0.1 0.0 - 0.7 K/uL   Basophils Relative 0 0 - 1 %   Basophils Absolute 0.0 0.0 - 0.1 K/uL   Smear Review Criteria for review not met  Comprehensive metabolic panel  Result Value Ref Range   Sodium 137 135 - 146 mmol/L   Potassium 4.4 3.5 - 5.3 mmol/L   Chloride 104 98 - 110 mmol/L   CO2 23 20 - 31 mmol/L   Glucose, Bld 93 70 - 99 mg/dL   BUN 8 7 - 25 mg/dL   Creat 0.60 0.50 - 1.10 mg/dL   Total Bilirubin 0.3 0.2 - 1.2 mg/dL   Alkaline Phosphatase 61 33 - 115 U/L   AST 13 10 - 30 U/L   ALT 10 6 - 29 U/L   Total Protein 6.7 6.1 - 8.1 g/dL   Albumin 4.2 3.6 - 5.1 g/dL   Calcium 8.9 8.6 - 10.2 mg/dL  PTH, Intact and Calcium  Result Value Ref Range   PTH 25 14 - 64 pg/mL   Calcium 8.9 8.4 - 10.5 mg/dL  TSH  Result Value Ref Range   TSH 1.78 mIU/L       Assessment & Plan:   1. Depression    -worsening. -decrease Celexa to '30mg'$  daily; add Wellbutrin XL '150mg'$  daily. -continue Klonopin qhs. -continue Adderall at current dose. -highly encourage psychotherapy. -close follow-up. -prolonged face-to-face encounter for 25 minutes with greater than 50% of time dedicated to counseling and coordination of care.    No orders of the defined types were placed in this encounter.   Meds ordered this encounter  Medications  . buPROPion (WELLBUTRIN XL) 150 MG 24 hr tablet    Sig: Take 1 tablet (150 mg total) by mouth daily.    Dispense:  30 tablet    Refill:  3  . citalopram (CELEXA) 20 MG tablet    Sig: Take 1.5 tablets (30 mg total) by mouth daily.    Dispense:  45 tablet    Refill:  5  . clonazePAM (KLONOPIN) 0.5 MG tablet    Sig: Take 1 tablet (0.5 mg total) by mouth  at bedtime as needed for anxiety.    Dispense:  30 tablet    Refill:  1    Return in about 1 week (around 10/19/2015).    Vernetta Dizdarevic Elayne Guerin, M.D. [facility] [address] [place], [place]  [address] [phone] phone [phone] fax

## 2015-10-24 ENCOUNTER — Telehealth: Payer: Self-pay | Admitting: Internal Medicine

## 2015-10-24 MED ORDER — AMPHETAMINE-DEXTROAMPHETAMINE 10 MG PO TABS: 10.0000 mg | ORAL_TABLET | Freq: Two times a day (BID) | ORAL | Status: DC

## 2015-10-24 NOTE — Telephone Encounter (Addendum)
Patient is calling to get rx for her adderall  [facility] is up front when ready   Refill give for June/july

## 2015-11-01 ENCOUNTER — Telehealth: Payer: Self-pay

## 2015-11-01 NOTE — Telephone Encounter (Signed)
Patient needs FMLA forms completed for her last visit with Dr Tamala Julian. I was going to try and fill them out based off the notes but I wasn't sure about some of the answers since it isnt something new and it is something that has been going on for awhile. I highlighted the areas that need to be completed I will place them in your box on 11/01/15 if you could please return them to the FMLA/Disability box at the [employer] checkout desk within 5-7 (11/10/15) business days. Thank you

## 2015-11-08 MED FILL — CITALOPRAM HBR 40 MG TABLET: 40 | 90 days supply | Qty: 90 | Fill #1

## 2015-11-08 NOTE — Telephone Encounter (Signed)
Today is the 5th for these forms have we gotten them completed yet? Thank you

## 2015-11-11 MED FILL — BUPROPION HCL XL 150 MG TAB: 150 | 30 days supply | Qty: 30 | Fill #0

## 2015-11-14 NOTE — Telephone Encounter (Signed)
Have we completed these forms? °

## 2015-11-16 NOTE — Telephone Encounter (Signed)
Forms completed scanned and faxed to [employer] on 11/16/15

## 2015-12-14 ENCOUNTER — Encounter: Payer: Self-pay | Admitting: Physician Assistant

## 2015-12-14 ENCOUNTER — Encounter: Payer: Self-pay | Admitting: Family Medicine

## 2015-12-14 ENCOUNTER — Ambulatory Visit (INDEPENDENT_AMBULATORY_CARE_PROVIDER_SITE_OTHER): Payer: 59 | Admitting: Physician Assistant

## 2015-12-14 VITALS — BP 104/60 | HR 80 | Temp 98.3°F | Resp 18 | Wt 134.0 lb

## 2015-12-14 DIAGNOSIS — J029 Acute pharyngitis, unspecified: Secondary | ICD-10-CM | POA: Diagnosis not present

## 2015-12-14 DIAGNOSIS — J020 Streptococcal pharyngitis: Secondary | ICD-10-CM | POA: Diagnosis not present

## 2015-12-14 LAB — STREP GROUP A AG, W/REFLEX TO CULT: STREGTOCOCCUS GROUP A AG SCREEN: DETECTED — AB

## 2015-12-14 MED ORDER — AMOXICILLIN 500 MG PO CAPS: 500.0000 mg | ORAL_CAPSULE | Freq: Three times a day (TID) | ORAL | 0 refills | Status: DC

## 2015-12-14 MED FILL — AMOXICILLIN 500 MG CAPSULE: 500 | 10 days supply | Qty: 30 | Fill #0

## 2015-12-14 NOTE — Progress Notes (Signed)
Patient ID: Terri Wilson MRN: [id number], DOB: [birth year], 30 y.o. Date of Encounter: 12/14/2015, 1:33 PM    Chief Complaint:  Chief Complaint  Patient presents with  . Sore Throat  . URI     HPI: 30 y.o. year old white female presents with above.   Says she started getting sick on Friday, July 21. Says that she has a bad sore throat. Also body aches. Had fever 102 --- says "it spikes back up if she doesn't keep taking medicine." . Says that her neck hurts and feels like her throat is about to close up. Says that she is not blowing anything out of her nose and is not coughing or having phlegm or chest congestion. Says her son was sick last week. Says her husband recently developed similar symptoms that she has.     Home Meds:   Outpatient Medications Prior to Visit  Medication Sig Dispense Refill  . citalopram (CELEXA) 20 MG tablet Take 1.5 tablets (30 mg total) by mouth daily. 45 tablet 5  . ibuprofen (ADVIL,MOTRIN) 200 MG tablet Take 200 mg by mouth every 6 (six) hours as needed for fever or moderate pain.    . Multiple Vitamin (MULTIVITAMIN WITH MINERALS) TABS tablet Take 1 tablet by mouth daily. Reported on 05/25/2015    . amphetamine-dextroamphetamine (ADDERALL) 10 MG tablet Take 1 tablet (10 mg total) by mouth 2 (two) times daily. (Patient not taking: Reported on 12/14/2015) 60 tablet 0  . buPROPion (WELLBUTRIN XL) 150 MG 24 hr tablet Take 1 tablet (150 mg total) by mouth daily. (Patient not taking: Reported on 12/14/2015) 30 tablet 3  . clonazePAM (KLONOPIN) 0.5 MG tablet Take 1 tablet (0.5 mg total) by mouth at bedtime as needed for anxiety. (Patient not taking: Reported on 12/14/2015) 30 tablet 1  . metoprolol succinate (TOPROL-XL) 25 MG 24 hr tablet Take 0.5 tablets (12.5 mg total) by mouth daily. (Patient not taking: Reported on 10/12/2015) 30 tablet 3   No facility-administered medications prior to visit.     Allergies:  Allergies  Allergen Reactions  . Lamictal  [Lamotrigine] Anaphylaxis  . Hydrocodone Nausea Only    Tolerates codeine  . Vancomycin Other (See Comments)    Feels hot      Review of Systems: See HPI for pertinent ROS. All other ROS negative.    Physical Exam: Blood pressure 104/60, pulse 80, temperature 98.3 F (36.8 C), temperature source Oral, resp. rate 18, weight 134 lb (60.8 kg)., Body mass index is 20.37 kg/m. General:  WNWD WF. Appears in no acute distress. HEENT: Normocephalic, atraumatic, eyes without discharge, sclera non-icteric, nares are without discharge. Bilateral auditory canals clear, TM's are without perforation, pearly grey and translucent with reflective cone of light bilaterally. Oral cavity moist, posterior pharynx with moderate -- severe erythema. No exudate. She has had tonsillectomy. Neck: Supple. No thyromegaly. Severe tenderness with palpation of anterior cervical lymph nodes bilaterally. Minimal tenderness with palpation of the tonsillar nodes bilaterally. Lungs: Clear bilaterally to auscultation without wheezes, rales, or rhonchi. Breathing is unlabored. Heart: Regular rhythm. No murmurs, rubs, or gallops. Msk:  Strength and tone normal for age. Extremities/Skin: Warm and dry.  No rashes. Neuro: Alert and oriented X 3. Moves all extremities spontaneously. Gait is normal. CNII-XII grossly in tact. Psych:  Responds to questions appropriately with a normal affect.   Results for orders placed or performed in visit on 12/14/15  STREP GROUP A AG, W/REFLEX TO CULT  Result Value Ref Range  SOURCE THROAT    STREGTOCOCCUS GROUP A AG SCREEN Detected (A)      ASSESSMENT AND PLAN:  30 y.o. year old female with  1. Strep pharyngitis She is to start amoxicillin immediately, take as directed, complete all of it. Even if her symptoms resolve prior to completion of antibiotic,  she is to complete all 10 days of antibiotics. She can use over-the-counter lozenges, spray, Tylenol, Motrin for symptom relief in the  interim. She is given a note to be out of work today through Friday. Follow-up if needed. - amoxicillin (AMOXIL) 500 MG capsule; Take 1 capsule (500 mg total) by mouth 3 (three) times daily.  Dispense: 30 capsule; Refill: 0  2. Sorethroat - STREP GROUP A AG, W/REFLEX TO CULT   Signed, [address] [place], [place], [facility] 12/14/2015 1:33 PM

## 2016-08-08 ENCOUNTER — Ambulatory Visit (INDEPENDENT_AMBULATORY_CARE_PROVIDER_SITE_OTHER): Payer: BLUE CROSS/BLUE SHIELD | Admitting: Family Medicine

## 2016-08-08 ENCOUNTER — Encounter: Payer: Self-pay | Admitting: Family Medicine

## 2016-08-08 VITALS — BP 110/60 | HR 89 | Temp 98.1°F | Resp 16 | Ht 67.0 in | Wt 143.0 lb

## 2016-08-08 DIAGNOSIS — F329 Major depressive disorder, single episode, unspecified: Secondary | ICD-10-CM

## 2016-08-08 DIAGNOSIS — F4320 Adjustment disorder, unspecified: Secondary | ICD-10-CM | POA: Diagnosis not present

## 2016-08-08 DIAGNOSIS — F4321 Adjustment disorder with depressed mood: Secondary | ICD-10-CM

## 2016-08-08 DIAGNOSIS — F32A Depression, unspecified: Secondary | ICD-10-CM

## 2016-08-08 MED ORDER — CITALOPRAM HYDROBROMIDE 20 MG PO TABS: 20.0000 mg | ORAL_TABLET | Freq: Every day | ORAL | 1 refills | Status: DC

## 2016-08-08 MED ORDER — CLONAZEPAM 0.5 MG PO TABS: 0.5000 mg | ORAL_TABLET | Freq: Every evening | ORAL | 1 refills | Status: DC | PRN

## 2016-08-08 NOTE — Patient Instructions (Addendum)
Thank you for talking with me today.    I can't imagine how hard this is for you.  If you have ANY questions whatsoever or concerns, send a Mychart message -- or call.    Come back and see me in 3 weeks when you're back from vacation

## 2016-08-08 NOTE — Progress Notes (Signed)
m °

## 2016-08-08 NOTE — Progress Notes (Addendum)
Terri Wilson is a 31 y.o. female who presents to Primary Care at [facility] today for depression:  1.  Depression:  Patient has had longstanding history of depression since she was teenager.  She has been on multiple medications in past.  She found a combination of Klonopin and Celexa which has helped her most recently and has been on this for years.  Starting in mid-Feb, she started a decrease of her celexa as she wanted to taper off of this.    Unfortunately, three days after stopping her medicines, her biological father killed himself.  She has been struggling with guilt, insomnia, difficulties with her diet, profound feelings of sadness since then.  In her words, she is unsure whether this is grief or just worsening of her depression. She has had nightmares of her father killing himself as well.  States she both overeats and undereats.  Also oversleeps and undersleeps.    No suicidal or homicidal ideations.   She has had passive death wish and thoughts in past, and occasionally has them now, but no actual suicidality.  No manic symptoms.   She has a history of panic attacks.  Also history of prior psychiatric hospitalization  ROS as above.  Pertinently, no chest pain, palpitations, SOB, Fever, Chills, Abd pain, N/V/D. No diarrhea or constipation.    PMH reviewed. Patient is a nonsmoker.   Past Medical History:  Diagnosis Date  . Allergic rhinitis   . Anxiety and depression   . Chiari malformation    7 mm  . Depression   . Gestational diabetes   . Gestational diabetes mellitus, antepartum   . Herpes labialis   . Migraine   . Panic attack    Past Surgical History:  Procedure Laterality Date  . TONSILLECTOMY AND ADENOIDECTOMY  2001  . WISDOM TOOTH EXTRACTION      Medications reviewed. Current Outpatient Prescriptions  Medication Sig Dispense Refill  . citalopram (CELEXA) 20 MG tablet Take 1.5 tablets (30 mg total) by mouth daily. 45 tablet 5  . clonazePAM (KLONOPIN) 0.5 MG  tablet Take 1 tablet (0.5 mg total) by mouth at bedtime as needed for anxiety. 30 tablet 1  . ibuprofen (ADVIL,MOTRIN) 200 MG tablet Take 200 mg by mouth every 6 (six) hours as needed for fever or moderate pain.    Marland Kitchen amphetamine-dextroamphetamine (ADDERALL) 10 MG tablet Take 1 tablet (10 mg total) by mouth 2 (two) times daily. (Patient not taking: Reported on 12/14/2015) 60 tablet 0  . buPROPion (WELLBUTRIN XL) 150 MG 24 hr tablet Take 1 tablet (150 mg total) by mouth daily. (Patient not taking: Reported on 12/14/2015) 30 tablet 3  . metoprolol succinate (TOPROL-XL) 25 MG 24 hr tablet Take 0.5 tablets (12.5 mg total) by mouth daily. (Patient not taking: Reported on 10/12/2015) 30 tablet 3  . Multiple Vitamin (MULTIVITAMIN WITH MINERALS) TABS tablet Take 1 tablet by mouth daily. Reported on 05/25/2015     No current facility-administered medications for this visit.      Physical Exam:  BP 110/60 (BP Location: Right Arm, Patient Position: Sitting, Cuff Size: Normal)   Pulse 89   Temp 98.1 F (36.7 C) (Oral)   Resp 16   Ht 5\' 7"  (1.702 m)   Wt 143 lb (64.9 kg)   LMP 08/08/2016   SpO2 99%   BMI 22.40 kg/m  Gen:  Alert, cooperative patient who appears stated age in no acute distress.  Vital signs reviewed. HEENT: EOMI,  MMM.  Multiple metal  fillings in mouth.  1 broken molar back left jaw Cardiac:  Regular rate and rhythm  Exts: Non edematous BL  LE, warm and well perfused.  Psych:  Appropriate for her mood.  Tearful at times.   Neuro:  Alert and oriented x 4. No focal deficits noted throughout  Assessment:  1.  Major depressive disorder, recurrent 2.  Normal grief  Plan:  - As she herself notes -- it's difficult to tell how much of this is normal grief after major life event versus recurrence of her major depressive disorder. - restarting Celexa at 20 mg.  To increase to 40 if she hasn't noted any difference in 7 days. She was previously on 40 mg daily.  - Klonopin for sleep and any  panic attacks - FU in 3 weeks (both she and I are on vacation in 2 weeks).  - We developed a safety contract -- if she begins to feel suicidal, she will tell her husband.  She also can contact me through [place].  She used to work here and feels hesitant about calling through staff who may know her personally, but states she will do this if necessary - No SI/HI/red flags currently.     Over 45 minutes of face time spent with patient.  60 minutes total reviewing past medical records

## 2016-09-05 MED FILL — CITALOPRAM HBR 20 MG TABLET: 20 | 30 days supply | Qty: 30 | Fill #0

## 2017-01-10 DIAGNOSIS — Z3481 Encounter for supervision of other normal pregnancy, first trimester: Secondary | ICD-10-CM | POA: Diagnosis not present

## 2017-01-10 DIAGNOSIS — Z113 Encounter for screening for infections with a predominantly sexual mode of transmission: Secondary | ICD-10-CM | POA: Diagnosis not present

## 2017-01-10 LAB — OB RESULTS CONSOLE HIV ANTIBODY (ROUTINE TESTING): HIV: NONREACTIVE

## 2017-01-10 LAB — OB RESULTS CONSOLE HEPATITIS B SURFACE ANTIGEN: Hepatitis B Surface Ag: NEGATIVE

## 2017-01-10 LAB — OB RESULTS CONSOLE GC/CHLAMYDIA
Chlamydia: NEGATIVE
Gonorrhea: NEGATIVE

## 2017-01-14 LAB — HM PAP SMEAR: HM Pap smear: NEGATIVE

## 2017-01-25 DIAGNOSIS — Z36 Encounter for antenatal screening for chromosomal anomalies: Secondary | ICD-10-CM | POA: Diagnosis not present

## 2017-01-25 DIAGNOSIS — Z3491 Encounter for supervision of normal pregnancy, unspecified, first trimester: Secondary | ICD-10-CM | POA: Diagnosis not present

## 2017-02-21 DIAGNOSIS — Z361 Encounter for antenatal screening for raised alphafetoprotein level: Secondary | ICD-10-CM | POA: Diagnosis not present

## 2017-03-21 DIAGNOSIS — R42 Dizziness and giddiness: Secondary | ICD-10-CM | POA: Diagnosis not present

## 2017-05-12 ENCOUNTER — Ambulatory Visit (HOSPITAL_COMMUNITY)
Admission: EM | Admit: 2017-05-12 | Discharge: 2017-05-12 | Disposition: A | Discharge status: Home / Self Care | Payer: Medicaid Other | Attending: Family Medicine | Admitting: Family Medicine

## 2017-05-12 ENCOUNTER — Encounter (HOSPITAL_COMMUNITY): Payer: Self-pay | Admitting: Emergency Medicine

## 2017-05-12 DIAGNOSIS — R6883 Chills (without fever): Secondary | ICD-10-CM | POA: Insufficient documentation

## 2017-05-12 DIAGNOSIS — R51 Headache: Secondary | ICD-10-CM

## 2017-05-12 DIAGNOSIS — F17210 Nicotine dependence, cigarettes, uncomplicated: Secondary | ICD-10-CM | POA: Diagnosis not present

## 2017-05-12 DIAGNOSIS — F329 Major depressive disorder, single episode, unspecified: Secondary | ICD-10-CM | POA: Insufficient documentation

## 2017-05-12 DIAGNOSIS — O26892 Other specified pregnancy related conditions, second trimester: Secondary | ICD-10-CM | POA: Insufficient documentation

## 2017-05-12 DIAGNOSIS — Z3A27 27 weeks gestation of pregnancy: Secondary | ICD-10-CM | POA: Diagnosis not present

## 2017-05-12 DIAGNOSIS — Z881 Allergy status to other antibiotic agents status: Secondary | ICD-10-CM | POA: Insufficient documentation

## 2017-05-12 DIAGNOSIS — O99332 Smoking (tobacco) complicating pregnancy, second trimester: Secondary | ICD-10-CM | POA: Insufficient documentation

## 2017-05-12 DIAGNOSIS — J00 Acute nasopharyngitis [common cold]: Secondary | ICD-10-CM | POA: Diagnosis not present

## 2017-05-12 DIAGNOSIS — Z885 Allergy status to narcotic agent status: Secondary | ICD-10-CM | POA: Insufficient documentation

## 2017-05-12 DIAGNOSIS — F410 Panic disorder [episodic paroxysmal anxiety] without agoraphobia: Secondary | ICD-10-CM | POA: Insufficient documentation

## 2017-05-12 DIAGNOSIS — J029 Acute pharyngitis, unspecified: Secondary | ICD-10-CM

## 2017-05-12 DIAGNOSIS — R05 Cough: Secondary | ICD-10-CM | POA: Diagnosis present

## 2017-05-12 DIAGNOSIS — Z79899 Other long term (current) drug therapy: Secondary | ICD-10-CM | POA: Insufficient documentation

## 2017-05-12 DIAGNOSIS — R6889 Other general symptoms and signs: Secondary | ICD-10-CM

## 2017-05-12 DIAGNOSIS — O99342 Other mental disorders complicating pregnancy, second trimester: Secondary | ICD-10-CM | POA: Insufficient documentation

## 2017-05-12 LAB — POCT RAPID STREP A: Streptococcus, Group A Screen (Direct): NEGATIVE

## 2017-05-12 MED ORDER — AMOXICILLIN 875 MG PO TABS: 875.0000 mg | ORAL_TABLET | Freq: Two times a day (BID) | ORAL | 0 refills | Status: DC

## 2017-05-12 NOTE — ED Triage Notes (Signed)
PT C/O: cold sx associated w/dry cough, HA, BA, chills, PND, ST  ONSET: 6 days  DENIES: fevers  TAKING MEDS: acetaminophen ... Pt is [redacted] weeks pregnant  A&O x4... NAD... Ambulatory

## 2017-05-12 NOTE — ED Provider Notes (Signed)
[facility]   [id number] 05/12/17 Arrival Time: 30   SUBJECTIVE:  Terri Wilson is a 31 y.o. female who presents to the urgent care with complaint of cold sx associated w/dry cough, HA, BA, chills, PND, ST for 6 days  [redacted] weeks pregnant   Past Medical History:  Diagnosis Date  . Allergic rhinitis   . Anxiety and depression   . Chiari malformation    7 mm  . Depression   . Gestational diabetes   . Gestational diabetes mellitus, antepartum   . Herpes labialis   . Migraine   . Panic attack    Family History  Problem Relation Age of Onset  . Asthma Mother        "allergy induced"  . Heart disease Father 59       first heart attack   . Breast cancer Maternal Grandmother   . Colon cancer Maternal Grandfather   . Hyperlipidemia Other   . Hypertension Other   . Obesity Other   . Heart disease Other   . Von Willebrand disease Sister   . Heart disease Paternal Aunt    Social History   Socioeconomic History  . Marital status: Married    Spouse name: [name]  . Number of children: 1  . Years of education: 50  . Highest education level: Not on file  Social Needs  . Financial resource strain: Not on file  . Food insecurity - worry: Not on file  . Food insecurity - inability: Not on file  . Transportation needs - medical: Not on file  . Transportation needs - non-medical: Not on file  Occupational History  . Occupation: Administrator: [employer]    Comment: [employer]  Tobacco Use  . Smoking status: Current Some Day Smoker    Packs/day: 1.00    Years: 10.00    Pack years: 10.00    Types: Cigarettes  . Smokeless tobacco: Never Used  Substance and Sexual Activity  . Alcohol use: No    Alcohol/week: 0.0 oz  . Drug use: No  . Sexual activity: Yes    Partners: Male  Other Topics Concern  . Not on file  Social History Narrative   Lives with husband.   Caffeine Use: 40mg /day   Current Meds  Medication Sig  . Multiple Vitamin  (MULTIVITAMIN WITH MINERALS) TABS tablet Take 1 tablet by mouth daily. Reported on 05/25/2015   Allergies  Allergen Reactions  . Lamictal [Lamotrigine] Anaphylaxis  . Hydrocodone Nausea Only    Tolerates codeine  . Vancomycin Other (See Comments)    Feels hot      ROS: As per HPI, remainder of ROS negative.   OBJECTIVE:   Vitals:   05/12/17 1835  BP: 122/86  Pulse: (!) 104  Resp: 16  Temp: 97.7 F (36.5 C)  TempSrc: Oral  SpO2: 99%     General appearance: alert; no distress Eyes: PERRL; EOMI; conjunctiva normal HENT: normocephalic; atraumatic; TMs normal, canal normal, external ears normal without trauma; nasal mucosa normal; oral pharynx reddened and swollen posteriorly Neck: supple Lungs: clear to auscultation bilaterally Heart: regular rate and rhythm Back: no CVA tenderness Extremities: no cyanosis or edema; symmetrical with no gross deformities Skin: warm and dry Neurologic: normal gait; grossly normal Psychological: alert and cooperative; normal mood and affect    Labs:  Results for orders placed or performed during the hospital encounter of 05/12/17  POCT rapid strep A [facility])  Result Value  Ref Range   Streptococcus, Group A Screen (Direct) NEGATIVE NEGATIVE    Labs Reviewed  CULTURE, GROUP A STREP [facility])  POCT RAPID STREP A    No results found.     ASSESSMENT & PLAN:  1. Flu-like symptoms     Meds ordered this encounter  Medications  . amoxicillin (AMOXIL) 875 MG tablet    Sig: Take 1 tablet (875 mg total) by mouth 2 (two) times daily.    Dispense:  20 tablet    Refill:  0    Reviewed expectations re: course of current medical issues. Questions answered. Outlined signs and symptoms indicating need for more acute intervention. Patient verbalized understanding. After Visit Summary given.    Procedures:      Robyn Haber, MD 05/12/17 [phone]

## 2017-05-12 NOTE — Discharge Instructions (Signed)
The rapid strep is negative.   We're running the strep culture In the meantime, start the amoxicillin and we'll call you when the culture is back.

## 2017-05-14 LAB — CULTURE, GROUP A STREP (THRC)

## 2017-05-15 DIAGNOSIS — Z3689 Encounter for other specified antenatal screening: Secondary | ICD-10-CM | POA: Diagnosis not present

## 2017-05-15 LAB — OB RESULTS CONSOLE RPR: RPR: NONREACTIVE

## 2017-05-21 NOTE — L&D Delivery Note (Signed)
Presented today at 1400 pm for SROM at 0200, clear fluid.  Onset of ctx at noon, every 3-5 minutes.  Labor progressed spontaneously to complete at 2130.  Doula support, nitrous oxide, and hydrotherapy for labor support.  Patient with good pushing effort.  FHR during pushing in 130s to 150s per doppler.  SVD of viable female infant in ROA position at 2153.  No nuchal.  Shoulders easily delivered.  Infant with spontaneous cry, dried, and placed on mother's chest.  Cord was clamped x 2 and cut by FOB.  Apgars 7 and 9.  Spontaneous delivery of Schultz placenta at 2200, intact, Battledore insertion.  3VC.  Uterus firm with massage below umbilicus with scant lochia.  No clots expressed.  Perineum with 1st degree perineal laceration.  Lidocaine for local anesthetic.  Repaired with 4-0 on SH.  EBL 200 ml.  Mom and infant stable.   Dierdre Harness, RN, SNM 08/01/2017 , 10:23 PM

## 2017-05-31 DIAGNOSIS — O24410 Gestational diabetes mellitus in pregnancy, diet controlled: Secondary | ICD-10-CM | POA: Diagnosis not present

## 2017-05-31 DIAGNOSIS — Z3480 Encounter for supervision of other normal pregnancy, unspecified trimester: Secondary | ICD-10-CM | POA: Diagnosis not present

## 2017-05-31 DIAGNOSIS — O43193 Other malformation of placenta, third trimester: Secondary | ICD-10-CM | POA: Diagnosis not present

## 2017-05-31 DIAGNOSIS — Z3A34 34 weeks gestation of pregnancy: Secondary | ICD-10-CM | POA: Diagnosis not present

## 2017-05-31 DIAGNOSIS — Z3A30 30 weeks gestation of pregnancy: Secondary | ICD-10-CM | POA: Diagnosis not present

## 2017-06-14 DIAGNOSIS — Z23 Encounter for immunization: Secondary | ICD-10-CM | POA: Diagnosis not present

## 2017-06-14 DIAGNOSIS — Z3A32 32 weeks gestation of pregnancy: Secondary | ICD-10-CM | POA: Diagnosis not present

## 2017-06-14 DIAGNOSIS — O43193 Other malformation of placenta, third trimester: Secondary | ICD-10-CM | POA: Diagnosis not present

## 2017-06-26 ENCOUNTER — Other Ambulatory Visit: Payer: Self-pay

## 2017-06-26 ENCOUNTER — Inpatient Hospital Stay (HOSPITAL_COMMUNITY)
Admission: AD | Admit: 2017-06-26 | Discharge: 2017-06-26 | Disposition: A | Discharge status: Home / Self Care | Payer: BLUE CROSS/BLUE SHIELD | Source: Ambulatory Visit | Attending: Obstetrics | Admitting: Obstetrics

## 2017-06-26 ENCOUNTER — Encounter (HOSPITAL_COMMUNITY): Payer: Self-pay | Admitting: *Deleted

## 2017-06-26 DIAGNOSIS — O26893 Other specified pregnancy related conditions, third trimester: Secondary | ICD-10-CM | POA: Diagnosis not present

## 2017-06-26 DIAGNOSIS — O99413 Diseases of the circulatory system complicating pregnancy, third trimester: Secondary | ICD-10-CM | POA: Insufficient documentation

## 2017-06-26 DIAGNOSIS — F419 Anxiety disorder, unspecified: Secondary | ICD-10-CM | POA: Insufficient documentation

## 2017-06-26 DIAGNOSIS — Z87891 Personal history of nicotine dependence: Secondary | ICD-10-CM | POA: Diagnosis not present

## 2017-06-26 DIAGNOSIS — Z79899 Other long term (current) drug therapy: Secondary | ICD-10-CM | POA: Insufficient documentation

## 2017-06-26 DIAGNOSIS — O99343 Other mental disorders complicating pregnancy, third trimester: Secondary | ICD-10-CM | POA: Insufficient documentation

## 2017-06-26 DIAGNOSIS — R000 Tachycardia, unspecified: Secondary | ICD-10-CM | POA: Insufficient documentation

## 2017-06-26 DIAGNOSIS — Z3A34 34 weeks gestation of pregnancy: Secondary | ICD-10-CM | POA: Diagnosis not present

## 2017-06-26 DIAGNOSIS — O9989 Other specified diseases and conditions complicating pregnancy, childbirth and the puerperium: Secondary | ICD-10-CM

## 2017-06-26 DIAGNOSIS — R55 Syncope and collapse: Secondary | ICD-10-CM | POA: Insufficient documentation

## 2017-06-26 DIAGNOSIS — Z885 Allergy status to narcotic agent status: Secondary | ICD-10-CM | POA: Insufficient documentation

## 2017-06-26 DIAGNOSIS — R42 Dizziness and giddiness: Secondary | ICD-10-CM | POA: Diagnosis not present

## 2017-06-26 DIAGNOSIS — Z881 Allergy status to other antibiotic agents status: Secondary | ICD-10-CM | POA: Diagnosis not present

## 2017-06-26 DIAGNOSIS — F329 Major depressive disorder, single episode, unspecified: Secondary | ICD-10-CM | POA: Diagnosis not present

## 2017-06-26 LAB — URINALYSIS, ROUTINE W REFLEX MICROSCOPIC
Bilirubin Urine: NEGATIVE
Glucose, UA: NEGATIVE mg/dL
Hgb urine dipstick: NEGATIVE
Ketones, ur: NEGATIVE mg/dL
Nitrite: NEGATIVE
Protein, ur: NEGATIVE mg/dL
Specific Gravity, Urine: 1.009 (ref 1.005–1.030)
pH: 6 (ref 5.0–8.0)

## 2017-06-26 LAB — CBC
HCT: 31.5 % — ABNORMAL LOW (ref 36.0–46.0)
Hemoglobin: 10.7 g/dL — ABNORMAL LOW (ref 12.0–15.0)
MCH: 29.2 pg (ref 26.0–34.0)
MCHC: 34 g/dL (ref 30.0–36.0)
MCV: 85.8 fL (ref 78.0–100.0)
Platelets: 153 10*3/uL (ref 150–400)
RBC: 3.67 MIL/uL — ABNORMAL LOW (ref 3.87–5.11)
RDW: 13.3 % (ref 11.5–15.5)
WBC: 10 10*3/uL (ref 4.0–10.5)

## 2017-06-26 LAB — GLUCOSE, CAPILLARY: Glucose-Capillary: 115 mg/dL — ABNORMAL HIGH (ref 65–99)

## 2017-06-26 MED ORDER — LACTATED RINGERS IV BOLUS (SEPSIS)
1000.0000 mL | Freq: Once | INTRAVENOUS | Status: AC
  Administered 2017-06-26: 1000 mL via INTRAVENOUS

## 2017-06-26 NOTE — MAU Provider Note (Signed)
History     CSN: [id number]  Arrival date and time: 06/26/17 1510   First Provider Initiated Contact with Patient 06/26/17 [phone]      Chief Complaint  Patient presents with  . Loss of Consciousness    Pt. not sure if she truly loss consciousness.   HPI   Ms.Terri Wilson is a 32 y.o. female G2P1001 @ [redacted]w[redacted]d here in MAU with complaints of severe dizziness. Something similar occurred in the first trimester however not this severe. She checked her BS and it was 123. Says she was at the park today from 1-2:30, the severe dizziness occurred around 2:30pm. States she had breakfast and lunch. States she has not been drinking much water in the last week. Says she has not had anything to drink at all today. Says she was trying to speak however could not speak the words that she wanted to. Says she had to pull her car over because she thought she was going to pass out. Says she did not lose consciousness. At the time she had a bad HA however now the HA is gone. She rates her pain 0/10.  Says she failed her 1 hour GTT and did not do her 3 hour. States she had GDM with her last pregnancy and does not feel at all like she has GDM with this pregnancy.   OB History    Gravida Para Term Preterm AB Living   2 1 1  0 0 1   SAB TAB Ectopic Multiple Live Births   0 0 0 0 1      Past Medical History:  Diagnosis Date  . Allergic rhinitis   . Anxiety and depression   . Chiari malformation    7 mm  . Depression   . Gestational diabetes   . Gestational diabetes mellitus, antepartum   . Herpes labialis   . Migraine   . Panic attack     Past Surgical History:  Procedure Laterality Date  . TONSILLECTOMY AND ADENOIDECTOMY  2001  . WISDOM TOOTH EXTRACTION      Family History  Problem Relation Age of Onset  . Asthma Mother        "allergy induced"  . Heart disease Father 76       first heart attack   . Breast cancer Maternal Grandmother   . Colon cancer Maternal Grandfather   . Hyperlipidemia  Other   . Hypertension Other   . Obesity Other   . Heart disease Other   . Von Willebrand disease Sister   . Heart disease Paternal Aunt     Social History   Tobacco Use  . Smoking status: Former Smoker    Packs/day: 1.00    Years: 10.00    Pack years: 10.00    Types: Cigarettes  . Smokeless tobacco: Former Network engineer Use Topics  . Alcohol use: No    Alcohol/week: 0.0 oz  . Drug use: No    Allergies:  Allergies  Allergen Reactions  . Lamictal [Lamotrigine] Anaphylaxis  . Hydrocodone Nausea Only    Tolerates codeine  . Vancomycin Other (See Comments)    Feels hot    Medications Prior to Admission  Medication Sig Dispense Refill Last Dose  . diphenhydramine-acetaminophen (TYLENOL PM) 25-500 MG TABS tablet Take 1 tablet by mouth at bedtime as needed.   06/25/2017 at Unknown time  . Prenatal Vit-Fe Fumarate-FA (PRENATAL MULTIVITAMIN) TABS tablet Take 1 tablet by mouth daily at 12 noon.   06/25/2017 at  Unknown time  . amoxicillin (AMOXIL) 875 MG tablet Take 1 tablet (875 mg total) by mouth 2 (two) times daily. (Patient not taking: Reported on 06/26/2017) 20 tablet 0 Not Taking at Unknown time  . citalopram (CELEXA) 20 MG tablet Take 1 tablet (20 mg total) by mouth daily. (Patient not taking: Reported on 06/26/2017) 30 tablet 1 Not Taking at Unknown time  . clonazePAM (KLONOPIN) 0.5 MG tablet Take 1 tablet (0.5 mg total) by mouth at bedtime as needed for anxiety. (Patient not taking: Reported on 06/26/2017) 30 tablet 1 Not Taking at Unknown time   Results for orders placed or performed during the hospital encounter of 06/26/17 (from the past 48 hour(s))  Urinalysis, Routine w reflex microscopic     Status: Abnormal   Collection Time: 06/26/17  3:19 PM  Result Value Ref Range   Color, Urine YELLOW YELLOW   APPearance CLEAR CLEAR   Specific Gravity, Urine 1.009 1.005 - 1.030   pH 6.0 5.0 - 8.0   Glucose, UA NEGATIVE NEGATIVE mg/dL   Hgb urine dipstick NEGATIVE NEGATIVE    Bilirubin Urine NEGATIVE NEGATIVE   Ketones, ur NEGATIVE NEGATIVE mg/dL   Protein, ur NEGATIVE NEGATIVE mg/dL   Nitrite NEGATIVE NEGATIVE   Leukocytes, UA TRACE (A) NEGATIVE   RBC / HPF 0-5 0 - 5 RBC/hpf   WBC, UA 0-5 0 - 5 WBC/hpf   Bacteria, UA MANY (A) NONE SEEN   Squamous Epithelial / LPF 0-5 (A) NONE SEEN   Mucus PRESENT     Comment: Performed at [facility], [address], [place], [place] [address]  Glucose, capillary     Status: Abnormal   Collection Time: 06/26/17  4:12 PM  Result Value Ref Range   Glucose-Capillary 115 (H) 65 - 99 mg/dL  CBC     Status: Abnormal   Collection Time: 06/26/17  4:19 PM  Result Value Ref Range   WBC 10.0 4.0 - 10.5 K/uL   RBC 3.67 (L) 3.87 - 5.11 MIL/uL   Hemoglobin 10.7 (L) 12.0 - 15.0 g/dL   HCT 31.5 (L) 36.0 - 46.0 %   MCV 85.8 78.0 - 100.0 fL   MCH 29.2 26.0 - 34.0 pg   MCHC 34.0 30.0 - 36.0 g/dL   RDW 13.3 11.5 - 15.5 %   Platelets 153 150 - 400 K/uL    Comment: Performed at [facility] [address] [place], [place] [address]   Review of Systems  Cardiovascular: Negative for chest pain.  Genitourinary: Negative for dysuria.  Neurological: Negative for speech difficulty and headaches.   Physical Exam   Blood pressure 125/69, pulse (!) 111, temperature 98.2 F (36.8 C), temperature source Oral, resp. rate 17, height 5\' 7"  (1.702 m), weight 155 lb 12 oz (70.6 kg), last menstrual period 08/08/2016, SpO2 99 %.  Physical Exam  Constitutional: She is oriented to person, place, and time. She appears well-developed and well-nourished. No distress.  HENT:  Head: Normocephalic.  Eyes: EOM are normal. Pupils are equal, round, and reactive to light.  Neck: Neck supple.  Cardiovascular: Normal rate and regular rhythm.  Murmur heard. GI: Soft. She exhibits no distension. There is no tenderness.  Musculoskeletal: Normal range of motion.  Neurological: She is alert and oriented to person, place, and time. She has normal  strength and normal reflexes. She is not disoriented. No cranial nerve deficit or sensory deficit. GCS eye subscore is 4. GCS verbal subscore is 5. GCS motor subscore is 6.  Skin: Skin  is warm. She is not diaphoretic.  Psychiatric: Her behavior is normal.   Fetal Tracing: Baseline: 130 bpm Variability: moderate  Accelerations: 15x15 Decelerations: None Toco: None  MAU Course  Procedures  None  MDM  CBC Orthostatic vitals with elevated HR from sitting to standing UA Discussed with Dr. Valentino Saxon EKG reviewed by [facility]  Patient up to the bathroom without difficulty. States she feels so much better.   Assessment and Plan   A:  1. Near syncope   2. Dizziness   3. Orthostatic lightheadedness     P:  Discharge home in stable condition with strict return precautions.  Encouraged 60-80 ounces of oral fluid at home Follow up with OB as scheduled or sooner if needed  Noni Saupe I, NP 06/28/2017 8:29 AM

## 2017-06-26 NOTE — MAU Note (Addendum)
For the past four days I have been experiencing blurred vision.  Today on the way home from the park, while driving, things started spinning, pulled off on the side of the road until vision became clear again.  Once home, I checked my blood sugar - 126 mg/dl.  Pt. also experiencing difficulty speaking. EFM applied - FHR 130s, toco applied - abd. Soft.  Positive for fetal movement, denies sudden gush of fluid.

## 2017-06-26 NOTE — Discharge Instructions (Signed)

## 2017-06-27 DIAGNOSIS — O24410 Gestational diabetes mellitus in pregnancy, diet controlled: Secondary | ICD-10-CM | POA: Diagnosis not present

## 2017-06-27 DIAGNOSIS — Z3A34 34 weeks gestation of pregnancy: Secondary | ICD-10-CM | POA: Diagnosis not present

## 2017-06-27 DIAGNOSIS — O43193 Other malformation of placenta, third trimester: Secondary | ICD-10-CM | POA: Diagnosis not present

## 2017-07-11 DIAGNOSIS — O24410 Gestational diabetes mellitus in pregnancy, diet controlled: Secondary | ICD-10-CM | POA: Diagnosis not present

## 2017-07-11 DIAGNOSIS — Z3685 Encounter for antenatal screening for Streptococcus B: Secondary | ICD-10-CM | POA: Diagnosis not present

## 2017-07-11 DIAGNOSIS — O43193 Other malformation of placenta, third trimester: Secondary | ICD-10-CM | POA: Diagnosis not present

## 2017-07-11 DIAGNOSIS — Z3A36 36 weeks gestation of pregnancy: Secondary | ICD-10-CM | POA: Diagnosis not present

## 2017-07-11 LAB — OB RESULTS CONSOLE GBS: GBS: NEGATIVE

## 2017-07-19 DIAGNOSIS — O43193 Other malformation of placenta, third trimester: Secondary | ICD-10-CM | POA: Diagnosis not present

## 2017-07-19 DIAGNOSIS — O24410 Gestational diabetes mellitus in pregnancy, diet controlled: Secondary | ICD-10-CM | POA: Diagnosis not present

## 2017-07-19 DIAGNOSIS — Z3A37 37 weeks gestation of pregnancy: Secondary | ICD-10-CM | POA: Diagnosis not present

## 2017-07-26 DIAGNOSIS — Z3A38 38 weeks gestation of pregnancy: Secondary | ICD-10-CM | POA: Diagnosis not present

## 2017-07-26 DIAGNOSIS — O24410 Gestational diabetes mellitus in pregnancy, diet controlled: Secondary | ICD-10-CM | POA: Diagnosis not present

## 2017-07-26 DIAGNOSIS — O43193 Other malformation of placenta, third trimester: Secondary | ICD-10-CM | POA: Diagnosis not present

## 2017-08-01 ENCOUNTER — Other Ambulatory Visit: Payer: Self-pay

## 2017-08-01 ENCOUNTER — Encounter (HOSPITAL_COMMUNITY): Payer: Self-pay | Admitting: *Deleted

## 2017-08-01 ENCOUNTER — Inpatient Hospital Stay (HOSPITAL_COMMUNITY)
Admission: AD | Admit: 2017-08-01 | Discharge: 2017-08-03 | DRG: 806 | Disposition: A | Discharge status: Home / Self Care | Payer: BLUE CROSS/BLUE SHIELD | Source: Ambulatory Visit | Attending: Obstetrics and Gynecology | Admitting: Obstetrics and Gynecology

## 2017-08-01 DIAGNOSIS — O9081 Anemia of the puerperium: Secondary | ICD-10-CM | POA: Diagnosis not present

## 2017-08-01 DIAGNOSIS — D62 Acute posthemorrhagic anemia: Secondary | ICD-10-CM | POA: Diagnosis not present

## 2017-08-01 DIAGNOSIS — Z3A39 39 weeks gestation of pregnancy: Secondary | ICD-10-CM | POA: Diagnosis not present

## 2017-08-01 DIAGNOSIS — O43123 Velamentous insertion of umbilical cord, third trimester: Secondary | ICD-10-CM | POA: Diagnosis present

## 2017-08-01 DIAGNOSIS — Z3483 Encounter for supervision of other normal pregnancy, third trimester: Secondary | ICD-10-CM | POA: Diagnosis not present

## 2017-08-01 DIAGNOSIS — O4290 Premature rupture of membranes, unspecified as to length of time between rupture and onset of labor, unspecified weeks of gestation: Secondary | ICD-10-CM

## 2017-08-01 DIAGNOSIS — Z87891 Personal history of nicotine dependence: Secondary | ICD-10-CM

## 2017-08-01 DIAGNOSIS — O24420 Gestational diabetes mellitus in childbirth, diet controlled: Principal | ICD-10-CM | POA: Diagnosis present

## 2017-08-01 DIAGNOSIS — Z23 Encounter for immunization: Secondary | ICD-10-CM | POA: Diagnosis not present

## 2017-08-01 MED ORDER — OXYTOCIN 10 UNIT/ML IJ SOLN
10.0000 [IU] | Freq: Once | INTRAMUSCULAR | Status: DC
  Administered 2017-08-01: 10 [IU] via INTRAMUSCULAR
  Filled 2017-08-01: qty 1

## 2017-08-01 MED ORDER — LIDOCAINE HCL (PF) 1 % IJ SOLN
30.0000 mL | INTRAMUSCULAR | Status: DC | PRN
  Administered 2017-08-01: 30 mL via SUBCUTANEOUS
  Filled 2017-08-01: qty 30

## 2017-08-01 MED ORDER — SOD CITRATE-CITRIC ACID 500-334 MG/5ML PO SOLN: 30.0000 mL | ORAL | Status: DC | PRN

## 2017-08-01 MED ORDER — ACETAMINOPHEN 325 MG PO TABS
650.0000 mg | ORAL_TABLET | ORAL | Status: DC | PRN
  Administered 2017-08-02: 650 mg via ORAL
  Filled 2017-08-01: qty 2

## 2017-08-01 MED ORDER — IBUPROFEN 600 MG PO TABS
600.0000 mg | ORAL_TABLET | Freq: Four times a day (QID) | ORAL | Status: DC
  Administered 2017-08-02 – 2017-08-03 (×5): 600 mg via ORAL
  Filled 2017-08-01 (×7): qty 1

## 2017-08-01 MED ORDER — LACTATED RINGERS IV SOLN: 500.0000 mL | INTRAVENOUS | Status: DC | PRN

## 2017-08-01 NOTE — MAU Note (Signed)
Water broke last night no ctx until  1.5hr ago. q 3 min. Told to come in for check. Good fetal movement felt.

## 2017-08-01 NOTE — MAU Note (Signed)
Urine In lab 

## 2017-08-01 NOTE — MAU Note (Signed)
Report given to L&D charge, pt. Transferred to #[id number] for "water birth", doula present. Pt. Allowed to ambulate with intermittent fetal monitoring. See CNM notes for guidance - pt. desires as little interventions as possible, agree to have lab work drawn , but no IV. Provider is aware.

## 2017-08-01 NOTE — Progress Notes (Signed)
S: painful ctx / using nitrous with minimal effect  O:  VS: BP 129/80   Pulse 95   Temp 97.6 F (36.4 C) (Oral)   Resp 18   Ht 5\' 7"  (1.702 m)   Wt 78.5 kg (173 lb)   LMP 08/08/2016   BMI 27.10 kg/m         FHR : intermittent 130-140        Toco: contractions every 2-3 minutes        Cervix : 4cm / 80% / vtx 0 station        Membranes: clear fluid  A: active labor  P: water immersion trial      may need epidural for pain management  Artelia Laroche CNM, MSN, [facility] 08/01/2017, 6:46 PM

## 2017-08-01 NOTE — H&P (Signed)
Terri Wilson, CNM  Certified Nurse Midwife  Obstetrics  Progress Notes  Signed  Date of Service:  08/01/2017 2:14 PM          Signed           [] Hide copied text  [] Hover for details   MAU triage / Admission H&P Admit date: 08/01/2017 @  Terri Wilson is a 32 y.o. female presenting for SROM @ 2pm - trickle of fluid 2 and 4am with large gush@ 6am. Clear fluid and GBS negative. Onset of ctx at noon - every 3-5 minutes.  Prenatal History: G2P1001   EDC : 08/07/2017, Date entered prior to episode creation  Prenatal care at [facility]  Primary Ob Provider: Sigmon CNM Prenatal course complicated by Chiari Malformation / GDMa1 / marginal cord insertion.  AGA sono with normal AFI @ 36 weeks  Medical / Surgical History : Past medical history:      Past Medical History:  Diagnosis Date  . Allergic rhinitis   . Anxiety and depression   . Chiari malformation    7 mm  . Depression   . Gestational diabetes   . Gestational diabetes mellitus, antepartum   . Herpes labialis   . Migraine   . Panic attack      Past surgical history:       Past Surgical History:  Procedure Laterality Date  . TONSILLECTOMY AND ADENOIDECTOMY  2001  . WISDOM TOOTH EXTRACTION      Family History:       Family History  Problem Relation Age of Onset  . Asthma Mother        "allergy induced"  . Heart disease Father 35       first heart attack   . Breast cancer Maternal Grandmother   . Colon cancer Maternal Grandfather   . Hyperlipidemia Other   . Hypertension Other   . Obesity Other   . Heart disease Other   . Von Willebrand disease Sister   . Heart disease Paternal Aunt      Social History:  reports that she has quit smoking. Her smoking use included cigarettes. She has a 10.00 pack-year smoking history. She has quit using smokeless tobacco. She reports that she does not drink alcohol or use drugs.   Allergies: Lamictal  [lamotrigine]; Hydrocodone; and Vancomycin    Current Medications at time of admission:         Prior to Admission medications   Medication Sig Start Date End Date Taking? Authorizing Provider  diphenhydramine-acetaminophen (TYLENOL PM) 25-500 MG TABS tablet Take 1 tablet by mouth at bedtime as needed.    [provider]  Prenatal Vit-Fe Fumarate-FA (PRENATAL MULTIVITAMIN) TABS tablet Take 1 tablet by mouth daily at 12 noon.    [provider]   Review of Systems: Active FM onset of ctx @ noon with ctx Q 3-5 minutes LOF  / SROM @ 0200 with large gush 0600 - clear / no odor bloody show pink discharge  Physical Exam: VS: Blood pressure 116/75, pulse 97, temperature 97.8 F (36.6 C), resp. rate 18, height 5\' 7"  (1.702 m), weight 78.5 kg (173 lb), last menstrual period 08/08/2016.  General: alert and oriented, appears calm and no pain Heart: RRR Lungs: Clear lung fields Abdomen: Gravid, soft and non-tender, non-distended / uterus: gravid and non-tender Extremities: no edema  Genitalia / VE:  2cm / 60% vtx / 0 station - small clear fluid leak  FHR: baseline rate 135 / variability  moderate / accelerations + / no decelerations TOCO: Q 3-6 minutes mild  Assessment: [redacted] weeks gestation latent stage of labor FHR category 1 with reactive NST GDMa1 - with AGA fetus MCI - normal growth  Plan:    discussed admission - expectant versus augmentation (12 hour ROM)     states she prefers to avoid pitocin and wants to wait 2-4 hours before considering augmentation       wants to go home to await labor - not recommended                   reviewed risks of infection / cord prolapse / FHR issues that are not monitored                    able to ambulate here - could consider castor oil or cervical balloon if declines pitocin              agrees to admit / declines augmentation - will ambulate with intermittent EFM Dr Garwin Brothers on-call -updated with admit and plan  for admit  Terri Wilson CNM, MSN, [facility] 08/01/2017, 2:14 PM

## 2017-08-01 NOTE — Progress Notes (Signed)
Terri triage / Admission H&P Admit date: 08/01/2017 @  Terri Wilson is a 32 y.o. female presenting for SROM @ 2pm - trickle of fluid 2 and 4am with large gush@ 6am. Clear fluid and GBS negative. Onset of ctx at noon - every 3-5 minutes.  Prenatal History: G2P1001   EDC : 08/07/2017, Date entered prior to episode creation  Prenatal care at [facility]  Primary Ob Provider: Sigmon CNM Prenatal course complicated by Chiari Malformation / GDMa1 / marginal cord insertion.  AGA sono with normal AFI @ 36 weeks  Medical / Surgical History : Past medical history:  Past Medical History:  Diagnosis Date  . Allergic rhinitis   . Anxiety and depression   . Chiari malformation    7 mm  . Depression   . Gestational diabetes   . Gestational diabetes mellitus, antepartum   . Herpes labialis   . Migraine   . Panic attack      Past surgical history:  Past Surgical History:  Procedure Laterality Date  . TONSILLECTOMY AND ADENOIDECTOMY  2001  . WISDOM TOOTH EXTRACTION      Family History:  Family History  Problem Relation Age of Onset  . Asthma Mother        "allergy induced"  . Heart disease Father 33       first heart attack   . Breast cancer Maternal Grandmother   . Colon cancer Maternal Grandfather   . Hyperlipidemia Other   . Hypertension Other   . Obesity Other   . Heart disease Other   . Von Willebrand disease Sister   . Heart disease Paternal Aunt      Social History:  reports that she has quit smoking. Her smoking use included cigarettes. She has a 10.00 pack-year smoking history. She has quit using smokeless tobacco. She reports that she does not drink alcohol or use drugs.   Allergies: Lamictal [lamotrigine]; Hydrocodone; and Vancomycin    Current Medications at time of admission:  Prior to Admission medications   Medication Sig Start Date End Date Taking? Authorizing Provider  diphenhydramine-acetaminophen (TYLENOL PM) 25-500 MG TABS tablet  Take 1 tablet by mouth at bedtime as needed.    [provider]  Prenatal Vit-Fe Fumarate-FA (PRENATAL MULTIVITAMIN) TABS tablet Take 1 tablet by mouth daily at 12 noon.    [provider]   Review of Systems: Active FM onset of ctx @ noon with ctx Q 3-5 minutes LOF  / SROM @ 0200 with large gush 0600 - clear / no odor bloody show pink discharge  Physical Exam: VS: Blood pressure 116/75, pulse 97, temperature 97.8 F (36.6 C), resp. rate 18, height 5\' 7"  (1.702 m), weight 78.5 kg (173 lb), last menstrual period 08/08/2016.  General: alert and oriented, appears calm and no pain Heart: RRR Lungs: Clear lung fields Abdomen: Gravid, soft and non-tender, non-distended / uterus: gravid and non-tender Extremities: no edema  Genitalia / VE:  2cm / 60% vtx / 0 station - small clear fluid leak  FHR: baseline rate 135 / variability moderate / accelerations + / no decelerations TOCO: Q 3-6 minutes mild  Assessment: [redacted] weeks gestation latent stage of labor FHR category 1 with reactive NST GDMa1 - with AGA fetus MCI - normal growth  Plan:  Discussed admission - expectant versus augmentation (12 hour ROM)      States she prefers to avoid pitocin if possible and wants to wait 2-4 hours before considering augmentation  wants to go home to await labor - not recommended                  reviewed risks of infection / cord prolapse / FHR issues that are not monitored    Agrees to admit / declines augmentation - will ambulate with intermittent EFM Dr Garwin Brothers on-call -updated with admit and plan for admit  Artelia Laroche CNM, MSN, [facility] 08/01/2017, 2:14 PM

## 2017-08-01 NOTE — MAU Note (Signed)
SROM at 0200 this morning, large amount, clear, pink tinge.

## 2017-08-02 ENCOUNTER — Encounter (HOSPITAL_COMMUNITY): Payer: Self-pay

## 2017-08-02 LAB — CBC
HCT: 31.2 % — ABNORMAL LOW (ref 36.0–46.0)
Hemoglobin: 10.3 g/dL — ABNORMAL LOW (ref 12.0–15.0)
MCH: 27.4 pg (ref 26.0–34.0)
MCHC: 33 g/dL (ref 30.0–36.0)
MCV: 83 fL (ref 78.0–100.0)
Platelets: 154 10*3/uL (ref 150–400)
RBC: 3.76 MIL/uL — ABNORMAL LOW (ref 3.87–5.11)
RDW: 13.6 % (ref 11.5–15.5)
WBC: 16.6 10*3/uL — ABNORMAL HIGH (ref 4.0–10.5)

## 2017-08-02 LAB — RPR: RPR Ser Ql: NONREACTIVE

## 2017-08-02 MED ORDER — BENZOCAINE-MENTHOL 20-0.5 % EX AERO
1.0000 "application " | INHALATION_SPRAY | CUTANEOUS | Status: DC | PRN
  Administered 2017-08-02: 1 via TOPICAL
  Filled 2017-08-02: qty 56

## 2017-08-02 MED ORDER — SENNOSIDES-DOCUSATE SODIUM 8.6-50 MG PO TABS
2.0000 | ORAL_TABLET | ORAL | Status: DC
  Administered 2017-08-02 – 2017-08-03 (×2): 2 via ORAL
  Filled 2017-08-02 (×2): qty 2

## 2017-08-02 MED ORDER — ONDANSETRON HCL 4 MG/2ML IJ SOLN: 4.0000 mg | INTRAMUSCULAR | Status: DC | PRN

## 2017-08-02 MED ORDER — PRENATAL MULTIVITAMIN CH
1.0000 | ORAL_TABLET | Freq: Every day | ORAL | Status: DC
  Administered 2017-08-02: 1 via ORAL
  Filled 2017-08-02: qty 1

## 2017-08-02 MED ORDER — DIPHENHYDRAMINE HCL 25 MG PO CAPS: 25.0000 mg | ORAL_CAPSULE | Freq: Four times a day (QID) | ORAL | Status: DC | PRN

## 2017-08-02 MED ORDER — ONDANSETRON HCL 4 MG PO TABS: 4.0000 mg | ORAL_TABLET | ORAL | Status: DC | PRN

## 2017-08-02 MED ORDER — ACETAMINOPHEN 325 MG PO TABS
650.0000 mg | ORAL_TABLET | ORAL | Status: DC | PRN
  Administered 2017-08-02 (×3): 650 mg via ORAL
  Filled 2017-08-02 (×3): qty 2

## 2017-08-02 MED ORDER — WITCH HAZEL-GLYCERIN EX PADS
1.0000 "application " | MEDICATED_PAD | CUTANEOUS | Status: DC | PRN
  Administered 2017-08-02: 1 via TOPICAL

## 2017-08-02 MED ORDER — ZOLPIDEM TARTRATE 5 MG PO TABS: 5.0000 mg | ORAL_TABLET | Freq: Every evening | ORAL | Status: DC | PRN

## 2017-08-02 MED ORDER — DIBUCAINE 1 % RE OINT
1.0000 "application " | TOPICAL_OINTMENT | RECTAL | Status: DC | PRN
  Administered 2017-08-02: 1 via RECTAL
  Filled 2017-08-02: qty 28

## 2017-08-02 MED ORDER — HYDROCORTISONE ACETATE 25 MG RE SUPP
25.0000 mg | Freq: Two times a day (BID) | RECTAL | Status: DC
  Administered 2017-08-02 (×2): 25 mg via RECTAL
  Filled 2017-08-02 (×3): qty 1

## 2017-08-02 MED ORDER — SIMETHICONE 80 MG PO CHEW: 80.0000 mg | CHEWABLE_TABLET | ORAL | Status: DC | PRN

## 2017-08-02 MED ORDER — COCONUT OIL OIL
1.0000 "application " | TOPICAL_OIL | Status: DC | PRN
  Administered 2017-08-02: 1 via TOPICAL
  Filled 2017-08-02: qty 120

## 2017-08-02 NOTE — Progress Notes (Addendum)
S:  Reports feeling well overall.  Did not get a lot of rest with baby cluster feeding last night but is coping well.  Some c/o of cramping with breastfeeding, managed well with ibuprofen and acetaminophen.  Tolerating regular diet without N/V.  Up ad lib/ambulating.  Voiding without difficulty.  Passing flatus.  Reports bleeding as light, without clots.  Breastfeeding going well.  Some pain with initial latch but subsides as breastfeeding continues.  O: Vital signs BP 108/63 (BP Location: Left Arm)   Pulse 88   Temp 97.9 F (36.6 C) (Oral)   Resp 18   Ht 5\' 7"  (1.702 m)   Wt 78.5 kg (173 lb)   LMP 08/08/2016   SpO2 99%   Breastfeeding? Unknown   BMI 27.10 kg/m  Labs  Recent Labs    08/02/17 0503  WBC 16.6*  HGB 10.3*  PLT 154    Alert and O x 3. Abdomen: soft, non-tender, non-distended. Fundus: deferred.  Sitting up on couch breastfeeding.   A: PPD 1 s/p SVD 1st degree perineal laceration Breastfeeding ID anemia compounded by ABL anemia Doing well, stable   P:   Continue with current PP plan of care. Start iron supplement. Anticipate discharge tomorrow  Addendum:  Uterus firm, -1, perineum well approximated   Dierdre Harness, RN, SNM 08/02/2017 8:00 AM

## 2017-08-02 NOTE — Lactation Note (Signed)
This note was copied from a baby's chart. Lactation Consultation Note  Patient Name: Terri Wilson [id number] Date: 08/02/2017 Reason for consult: Initial assessment  Initial visit at 16 hours of life. Mom is a P2 who reports oversupply, thrush, & damaged nipples with her 1st child. Mom is concerned about pain with latching. Mom's nipples are already appearing abraded & a "hickey" on [place] where infant missed latch. Mom did use a nipple shield w/her 1st child, but would prefer not to use that again (she felt that she became dependent upon it & it would fall off so easily b/c of her copious letdown, etc).   Infant had fed recently, but Mom wanted me to observe a latch. Infant only latched momentarily, but was too sleepy to continue. However, I did note the tightness in infant's jaw with latching.   Specifics of an asymmetric latch shown via [employer]. It is possible that Mom has been using a more "bulls-eye" approach when latching which has caused undue discomfort and abrading. I did recommend that Mom try laid-back nursing, as an infant laying prone on mom while feeding tends to relax jaw.   Comfort Gels provided w/instructions for use.    Matthias Hughs [facility] 08/02/2017, 2:15 PM

## 2017-08-02 NOTE — Progress Notes (Signed)
RN in to assess pt, pt was crying. Stated she was stressed about breastfeeding because baby was sleepy/not latching and her breasts are incredibly sore. Educated mother on alternative ways to feed baby such as hand expressing. Pt reported she had hand expressed but was concerned she was "not making enough". Educated mother about the size of baby's stomach at this point. Reassured mother she was doing a great job and continued to encourage her to call for assistance. Family currently sitting in room for support.

## 2017-08-02 NOTE — Progress Notes (Signed)
MOB was referred for history of depression/anxiety. * Referral screened out by Clinical Social Worker because none of the following criteria appear to apply: ~ History of anxiety/depression during this pregnancy, or of post-partum depression. ~ Diagnosis of anxiety and/or depression within last 3 years OR * MOB's symptoms currently being treated with medication and/or therapy. Please contact the Clinical Social Worker if needs arise, by MOB request, or if MOB scores greater than 9/yes to question 10 on Edinburgh Postpartum Depression Screen.  Harvir Patry Boyd-Gilyard, MSW, LCSW Clinical Social Work [phone] 

## 2017-08-03 MED ORDER — IBUPROFEN 600 MG PO TABS: 600.0000 mg | ORAL_TABLET | Freq: Four times a day (QID) | ORAL | 0 refills | Status: DC

## 2017-08-03 MED ORDER — SENNOSIDES-DOCUSATE SODIUM 8.6-50 MG PO TABS: 2.0000 | ORAL_TABLET | ORAL | Status: DC

## 2017-08-03 MED ORDER — HYDROCORTISONE ACETATE 25 MG RE SUPP: 25.0000 mg | Freq: Two times a day (BID) | RECTAL | 0 refills | Status: DC

## 2017-08-03 MED ORDER — ACETAMINOPHEN 325 MG PO TABS: 650.0000 mg | ORAL_TABLET | ORAL | Status: DC | PRN

## 2017-08-03 MED ORDER — BENZOCAINE-MENTHOL 20-0.5 % EX AERO: 1.0000 "application " | INHALATION_SPRAY | CUTANEOUS | Status: DC | PRN

## 2017-08-03 MED ORDER — COCONUT OIL OIL: 1.0000 "application " | TOPICAL_OIL | 0 refills | Status: DC | PRN

## 2017-08-03 NOTE — Lactation Note (Signed)
This note was copied from a baby's chart. Lactation Consultation Note  Patient Name: Terri Wilson Today's Date: 08/03/2017  Mom states latching is better and baby fed well during the night.  Reviewed milk coming to volume and engorgement prevention and treatment.  Lactation services and support reviewed and encouraged prn.   Maternal Data    Feeding    LATCH Score                   Interventions    Lactation Tools Discussed/Used     Consult Status      Ave Filter 08/03/2017, 9:07 AM

## 2017-08-03 NOTE — Progress Notes (Signed)
Post Partum Day #2           Information for the patient's newborn:  Judi, Jaffe [[id number]]  female  Baby name: Azzie Roup Feeding: breast  Subjective: No HA, SOB, CP, F/C, breast symptoms. Pain minimal. Normal vaginal bleeding, no clots.      Objective:  VS:  Vitals:   08/02/17 0455 08/02/17 1330 08/02/17 1900 08/03/17 0441  BP: 108/63 100/83 99/66 100/64  Pulse: 88  89 91  Resp: 18 18 20 18   Temp: 97.9 F (36.6 C) 97.8 F (36.6 C) 98.5 F (36.9 C) 98.5 F (36.9 C)  TempSrc: Oral  Oral Oral  SpO2: 99%  99%   Weight:      Height:        No intake or output data in the 24 hours ending 08/03/17 0938    Recent Labs    08/02/17 0503  WBC 16.6*  HGB 10.3*  HCT 31.2*  PLT 154    Blood type:  O pos Rubella:   Immune   Physical Exam:  General: alert, cooperative and no distress Uterine Fundus: FF @U , NT Lochia: appropriate Perineum: repair 1st deg lac, edema none DVT Evaluation: no cords or tenderness, no edema    Assessment/Plan: PPD # 2 / 32 y.o., [id number]    Principal Problem:   SVD (spontaneous vaginal delivery) 3/14 Active Problems:   Normal labor   First degree perineal laceration during delivery   Postpartum care following vaginal delivery GDM A1  - follow postpartum    Normal PP exam  Continue current postpartum care             DC home today w/ instructions  F/U at [facility] in 6 weeks and PRN   LOS: 2 days   Juliene Pina, CNM, MSN 08/03/2017, 9:38 AM

## 2017-08-03 NOTE — Discharge Summary (Signed)
Obstetric Discharge Summary Reason for Admission: rupture of membranes, GDM A1, marginal cord insertion, hx Chiari malformation Prenatal Procedures: ultrasound Intrapartum Procedures: spontaneous vaginal delivery and hydrotherapy Postpartum Procedures: none Complications-Operative and Postpartum: 1st degree perineal laceration Hemoglobin  Date Value Ref Range Status  08/02/2017 10.3 (L) 12.0 - 15.0 g/dL Final   HCT  Date Value Ref Range Status  08/02/2017 31.2 (L) 36.0 - 46.0 % Final    Physical Exam:  See progress note  Discharge Diagnoses: Term Pregnancy-delivered  Discharge Information: Date: 08/03/2017 Activity: pelvic rest Diet: routine Medications:  Allergies as of 08/03/2017      Reactions   Lamictal [lamotrigine] Anaphylaxis   Hydrocodone Nausea Only   Tolerates codeine   Vancomycin Other (See Comments)   Feels hot      Medication List    TAKE these medications   acetaminophen 325 MG tablet Commonly known as:  TYLENOL Take 2 tablets (650 mg total) by mouth every 4 (four) hours as needed (for pain scale < 4).   benzocaine-Menthol 20-0.5 % Aero Commonly known as:  DERMOPLAST Apply 1 application topically as needed for irritation (perineal discomfort).   coconut oil Oil Apply 1 application topically as needed.   hydrocortisone 25 MG suppository Commonly known as:  ANUSOL-HC Place 1 suppository (25 mg total) rectally 2 (two) times daily.   ibuprofen 600 MG tablet Commonly known as:  ADVIL,MOTRIN Take 1 tablet (600 mg total) by mouth every 6 (six) hours.   prenatal multivitamin Tabs tablet Take 1 tablet by mouth daily at 12 noon.   senna-docusate 8.6-50 MG tablet Commonly known as:  Senokot-S Take 2 tablets by mouth daily. Start taking on:  08/04/2017            Discharge Care Instructions  (From admission, onward)        Start     Ordered   08/03/17 0000  Discharge wound care:    Comments:  Sitz baths 2 times /day with warm water x 1 week    08/03/17 0950     Condition: stable Instructions: refer to practice specific booklet Discharge to: home Follow-up Information    Juliene Pina, CNM. Schedule an appointment as soon as possible for a visit in 6 day(s).   Specialty:  Obstetrics and Gynecology Contact information: [place] [address] [phone]           Newborn Data: Live born female Azzie Roup Birth Weight: 8 lb 8.9 oz (3881 g) APGAR: 7, 9  Newborn Delivery   Birth date/time:  08/01/2017 21:53:00 Delivery type:  Vaginal, Spontaneous     Home with mother.  Juliene Pina, CNM 08/03/2017, 9:51 AM

## 2017-09-27 DIAGNOSIS — Z30430 Encounter for insertion of intrauterine contraceptive device: Secondary | ICD-10-CM | POA: Diagnosis not present

## 2017-09-27 DIAGNOSIS — F53 Postpartum depression: Secondary | ICD-10-CM | POA: Diagnosis not present

## 2017-09-27 DIAGNOSIS — Z3202 Encounter for pregnancy test, result negative: Secondary | ICD-10-CM | POA: Diagnosis not present

## 2017-09-30 DIAGNOSIS — H5213 Myopia, bilateral: Secondary | ICD-10-CM | POA: Diagnosis not present

## 2017-10-04 ENCOUNTER — Encounter: Payer: Self-pay | Admitting: Family Medicine

## 2017-10-09 ENCOUNTER — Encounter: Payer: Self-pay | Admitting: Family Medicine

## 2017-10-24 DIAGNOSIS — Z30431 Encounter for routine checking of intrauterine contraceptive device: Secondary | ICD-10-CM | POA: Diagnosis not present

## 2018-05-26 ENCOUNTER — Encounter: Payer: Self-pay | Admitting: Nurse Practitioner

## 2018-05-26 ENCOUNTER — Ambulatory Visit: Payer: Medicaid Other | Admitting: Nurse Practitioner

## 2018-05-26 VITALS — BP 110/80 | HR 90 | Temp 98.4°F | Wt 134.0 lb

## 2018-05-26 DIAGNOSIS — J029 Acute pharyngitis, unspecified: Secondary | ICD-10-CM

## 2018-05-26 LAB — POCT RAPID STREP A (OFFICE): Rapid Strep A Screen: NEGATIVE

## 2018-05-26 MED ORDER — AMOXICILLIN 875 MG PO TABS: 875.0000 mg | ORAL_TABLET | Freq: Two times a day (BID) | ORAL | 0 refills | Status: AC

## 2018-05-26 NOTE — Patient Instructions (Signed)
Pharyngitis -Take medication as prescribed. -Ibuprofen or Tylenol for pain, fever, or general discomfort. -Increase fluids. Get plenty of rest. -Warm saltwater gargles 3-4 times daily until symptoms improve. -May use a teaspoon of honey or over-the-counter cough drops to help with sore throat. -Follow-up with PCP if symptoms do not improve as you will need a throat culture at that time.   Pharyngitis is redness, pain, and swelling (inflammation) of the throat (pharynx). It is a very common cause of sore throat. Pharyngitis can be caused by a bacteria, but it is usually caused by a virus. Most cases of pharyngitis get better on their own without treatment. What are the causes? This condition may be caused by:  Infection by viruses (viral). Viral pharyngitis spreads from person to person (is contagious) through coughing, sneezing, and sharing of personal items or utensils such as cups, forks, spoons, and toothbrushes.  Infection by bacteria (bacterial). Bacterial pharyngitis may be spread by touching the nose or face after coming in contact with the bacteria, or through more intimate contact, such as kissing.  Allergies. Allergies can cause buildup of mucus in the throat (post-nasal drip), leading to inflammation and irritation. Allergies can also cause blocked nasal passages, forcing breathing through the mouth, which dries and irritates the throat. What increases the risk? You are more likely to develop this condition if:  You are 33-21 years old.  You are exposed to crowded environments such as daycare, school, or dormitory living.  You live in a cold climate.  You have a weakened disease-fighting (immune) system. What are the signs or symptoms? Symptoms of this condition vary by the cause (viral, bacterial, or allergies) and can include:  Sore throat.  Fatigue.  Low-grade fever.  Headache.  Joint pain and muscle aches.  Skin rashes.  Swollen glands in the throat (lymph  nodes).  Plaque-like film on the throat or tonsils. This is often a symptom of bacterial pharyngitis.  Vomiting.  Stuffy nose (nasal congestion).  Cough.  Red, itchy eyes (conjunctivitis).  Loss of appetite. How is this diagnosed? This condition is often diagnosed based on your medical history and a physical exam. Your health care provider will ask you questions about your illness and your symptoms. A swab of your throat may be done to check for bacteria (rapid strep test). Other lab tests may also be done, depending on the suspected cause, but these are rare. How is this treated? This condition usually gets better in 3-4 days without medicine. Bacterial pharyngitis may be treated with antibiotic medicines. Follow these instructions at home:  Take over-the-counter and prescription medicines only as told by your health care provider. ? If you were prescribed an antibiotic medicine, take it as told by your health care provider. Do not stop taking the antibiotic even if you start to feel better. ? Do not give children aspirin because of the association with Reye syndrome.  Drink enough water and fluids to keep your urine clear or pale yellow.  Get a lot of rest.  Gargle with a salt-water mixture 3-4 times a day or as needed. To make a salt-water mixture, completely dissolve -1 tsp of salt in 1 cup of warm water.  If your health care provider approves, you may use throat lozenges or sprays to soothe your throat. Contact a health care provider if:  You have large, tender lumps in your neck.  You have a rash.  You cough up green, yellow-brown, or bloody spit. Get help right away if:  Your  neck becomes stiff.  You drool or are unable to swallow liquids.  You cannot drink or take medicines without vomiting.  You have severe pain that does not go away, even after you take medicine.  You have trouble breathing, and it is not caused by a stuffy nose.  You have new pain and  swelling in your joints such as the knees, ankles, wrists, or elbows. Summary  Pharyngitis is redness, pain, and swelling (inflammation) of the throat (pharynx).  While pharyngitis can be caused by a bacteria, the most common causes are viral.  Most cases of pharyngitis get better on their own without treatment.  Bacterial pharyngitis is treated with antibiotic medicines. This information is not intended to replace advice given to you by your health care provider. Make sure you discuss any questions you have with your health care provider. Document Released: 05/07/2005 Document Revised: 06/12/2016 Document Reviewed: 06/12/2016 Elsevier Interactive Patient Education  2019 [employer].

## 2018-05-26 NOTE — Progress Notes (Signed)
Subjective:     Terri Wilson is a 33 y.o. female who presents for evaluation of sore throat. Associated symptoms include suspected fevers but not measured at home, post nasal drip, sore throat, swollen glands and bodyaches. Onset of symptoms was 8 days ago, and have been gradually worsening since that time. She is drinking plenty of fluids. She has not had a recent close exposure to someone with proven streptococcal pharyngitis.  The patient denies any recent oral sex.  Patient was treated on day 1 of her symptoms through tele-doc with amoxicillin for 7 days.  Patient states she finished her antibiotic on yesterday.  Patient rates current throat pain 3/10 at present.  The following portions of the patient's history were reviewed and updated as appropriate: allergies, current medications and past medical history.  Review of Systems Constitutional: positive for fatigue, fevers and malaise, negative for anorexia, chills and sweats Eyes: negative Ears, nose, mouth, throat, and face: positive for nasal congestion and sore throat, negative for ear drainage, earaches and hoarseness Respiratory: positive for cough, negative for asthma, chronic bronchitis, sputum, stridor and wheezing Cardiovascular: negative Gastrointestinal: positive for nausea, negative for abdominal pain, constipation, diarrhea and vomiting Neurological: negative    Objective:    BP 110/80 (BP Location: Right Arm, Patient Position: Sitting)   Pulse 90   Temp 98.4 F (36.9 C) (Oral)   Wt 134 lb (60.8 kg)   SpO2 98%   BMI 20.99 kg/m  General appearance: alert, cooperative and fatigued Head: Normocephalic, without obvious abnormality, atraumatic Eyes: conjunctivae/corneas clear. PERRL, EOM's intact. Fundi benign. Ears: abnormal TM right ear - mucoid middle ear fluid and abnormal TM left ear - mucoid middle ear fluid Nose: Nares normal. Septum midline. Mucosa normal. No drainage or sinus tenderness. Throat: abnormal  findings: moderate oropharyngeal erythema and tonsils removed, + postnasal drip,  Lungs: clear to auscultation bilaterally Heart: regular rate and rhythm, S1, S2 normal, no murmur, click, rub or gallop Abdomen: soft, non-tender; bowel sounds normal; no masses,  no organomegaly Pulses: 2+ and symmetric Skin: Skin color, texture, turgor normal. No rashes or lesions Lymph nodes: bilateral superficial cervical adenopathy, increased on right side Neurologic: Grossly normal  Laboratory Strep test done. Results:negative.    Assessment:    Acute pharyngitis  Plan:   Exam findings, diagnosis etiology and medication use and indications reviewed with patient. Follow- Up and discharge instructions provided. No emergent/urgent issues found on exam.  Some the patient's clinical presentations, symptoms, and physical exam, I am going to go ahead and extend her antibiotic treatment for an additional 5 days.  Strep test was negative, however patient's throat is moderately erythematous, and patient states her symptoms have worsened.  Informed patient that if symptoms do not resolve within the next 5 days, she will need to go to an office where she can have a throat culture done.  Patient's vital signs are stable with no other emergent issues. patient education was provided. Patient verbalized understanding of information provided and agrees with plan of care (POC), all questions answered. The patient is advised to call or return to clinic if condition does not see an improvement in symptoms, or to seek the care of the closest emergency department if condition worsens with the above plan.   1. Sore throat  - POCT rapid strep A  2. Pharyngitis, unspecified etiology  - amoxicillin (AMOXIL) 875 MG tablet; Take 1 tablet (875 mg total) by mouth 2 (two) times daily for 5 days.  Dispense: 10 tablet;  Refill: 0  -Take medication as prescribed. -Ibuprofen or Tylenol for pain, fever, or general discomfort. -Increase  fluids. Get plenty of rest. -Warm saltwater gargles 3-4 times daily until symptoms improve. -May use a teaspoon of honey or over-the-counter cough drops to help with sore throat. -Follow-up with PCP if symptoms do not improve as you will need a throat culture at that time.

## 2018-05-28 ENCOUNTER — Telehealth: Payer: Self-pay | Admitting: Obstetrics and Gynecology

## 2018-05-28 NOTE — Telephone Encounter (Signed)
Left message to return call to our office.  

## 2018-05-28 NOTE — Telephone Encounter (Signed)
Please advise if pt can be seen sooner.    Copied from [place] [id number]. Topic: Appointment Scheduling - Scheduling Inquiry for Clinic >> May 28, 2018  9:43 AM Scherrie Gerlach wrote: Reason for CRM: mom Terri Wilson, dr Juleen China pt) called today to ask if Dr Juleen China had any sooner appts for new pt.  She states pt (who is her daughter) is having some heart issues. Mom did not elaborate. These issues have been going on a while, and mom concerned.  Pt has appt scheduled for 2/04 .

## 2018-06-01 ENCOUNTER — Emergency Department (HOSPITAL_COMMUNITY): Payer: BLUE CROSS/BLUE SHIELD

## 2018-06-01 ENCOUNTER — Other Ambulatory Visit: Payer: Self-pay

## 2018-06-01 ENCOUNTER — Encounter (HOSPITAL_COMMUNITY): Payer: Self-pay | Admitting: Emergency Medicine

## 2018-06-01 ENCOUNTER — Emergency Department (HOSPITAL_COMMUNITY)
Admission: EM | Admit: 2018-06-01 | Discharge: 2018-06-01 | Disposition: A | Discharge status: Home / Self Care | Payer: BLUE CROSS/BLUE SHIELD | Attending: Emergency Medicine | Admitting: Emergency Medicine

## 2018-06-01 DIAGNOSIS — R112 Nausea with vomiting, unspecified: Secondary | ICD-10-CM

## 2018-06-01 DIAGNOSIS — Z87891 Personal history of nicotine dependence: Secondary | ICD-10-CM | POA: Insufficient documentation

## 2018-06-01 DIAGNOSIS — R079 Chest pain, unspecified: Secondary | ICD-10-CM | POA: Diagnosis not present

## 2018-06-01 DIAGNOSIS — Z79899 Other long term (current) drug therapy: Secondary | ICD-10-CM | POA: Insufficient documentation

## 2018-06-01 DIAGNOSIS — E860 Dehydration: Secondary | ICD-10-CM

## 2018-06-01 DIAGNOSIS — R000 Tachycardia, unspecified: Secondary | ICD-10-CM | POA: Insufficient documentation

## 2018-06-01 DIAGNOSIS — R0789 Other chest pain: Secondary | ICD-10-CM | POA: Diagnosis not present

## 2018-06-01 LAB — BASIC METABOLIC PANEL
Anion gap: 9 (ref 5–15)
BUN: 16 mg/dL (ref 6–20)
CO2: 27 mmol/L (ref 22–32)
Calcium: 9 mg/dL (ref 8.9–10.3)
Chloride: 102 mmol/L (ref 98–111)
Creatinine, Ser: 0.72 mg/dL (ref 0.44–1.00)
GFR calc Af Amer: >60 mL/min (ref >60)
GFR calc non Af Amer: >60 mL/min (ref >60)
Glucose, Bld: 116 mg/dL — ABNORMAL HIGH (ref 70–99)
Potassium: 3.7 mmol/L (ref 3.5–5.1)
Sodium: 138 mmol/L (ref 135–145)

## 2018-06-01 LAB — CBC
HCT: 43.9 % (ref 36.0–46.0)
Hemoglobin: 14.5 g/dL (ref 12.0–15.0)
MCH: 28.9 pg (ref 26.0–34.0)
MCHC: 33 g/dL (ref 30.0–36.0)
MCV: 87.6 fL (ref 80.0–100.0)
Platelets: 196 10*3/uL (ref 150–400)
RBC: 5.01 MIL/uL (ref 3.87–5.11)
RDW: 11.9 % (ref 11.5–15.5)
WBC: 6.3 10*3/uL (ref 4.0–10.5)
nRBC: 0 % (ref 0.0–0.2)

## 2018-06-01 LAB — URINALYSIS, ROUTINE W REFLEX MICROSCOPIC
Bacteria, UA: NONE SEEN
Bilirubin Urine: NEGATIVE
Glucose, UA: NEGATIVE mg/dL
Hgb urine dipstick: NEGATIVE
Ketones, ur: NEGATIVE mg/dL
Nitrite: NEGATIVE
Protein, ur: NEGATIVE mg/dL
Specific Gravity, Urine: 1.029 (ref 1.005–1.030)
pH: 5 (ref 5.0–8.0)

## 2018-06-01 LAB — POCT I-STAT TROPONIN I: Troponin i, poc: 0 ng/mL (ref 0.00–0.08)

## 2018-06-01 LAB — I-STAT BETA HCG BLOOD, ED (NOT ORDERABLE): I-stat hCG, quantitative: <5 m[IU]/mL (ref <5)

## 2018-06-01 LAB — TSH: TSH: 2.177 u[IU]/mL (ref 0.350–4.500)

## 2018-06-01 MED ORDER — ONDANSETRON HCL 4 MG/2ML IJ SOLN
4.0000 mg | Freq: Once | INTRAMUSCULAR | Status: AC
  Administered 2018-06-01: 4 mg via INTRAVENOUS
  Filled 2018-06-01: qty 2

## 2018-06-01 MED ORDER — ONDANSETRON 4 MG PO TBDP: 4.0000 mg | ORAL_TABLET | Freq: Three times a day (TID) | ORAL | 0 refills | Status: DC | PRN

## 2018-06-01 MED ORDER — SODIUM CHLORIDE 0.9 % IV BOLUS
1000.0000 mL | Freq: Once | INTRAVENOUS | Status: AC
  Administered 2018-06-01: 1000 mL via INTRAVENOUS

## 2018-06-01 MED ORDER — KETOROLAC TROMETHAMINE 15 MG/ML IJ SOLN
15.0000 mg | Freq: Once | INTRAMUSCULAR | Status: AC
  Administered 2018-06-01: 15 mg via INTRAVENOUS
  Filled 2018-06-01: qty 1

## 2018-06-01 NOTE — ED Triage Notes (Addendum)
Patient c/o central chest pain radiating to right chest with SOB today. Reports N/V/D that resolved x2 days ago.

## 2018-06-01 NOTE — ED Provider Notes (Signed)
[facility] DEPT Provider Note   CSN: [id number] Arrival date & time: 06/01/18  1403     History   Chief Complaint Chief Complaint  Patient presents with  . Chest Pain    HPI Terri Wilson is a 33 y.o. female.  The history is provided by the patient and medical records. No language interpreter was used.  Chest Pain  Associated symptoms: nausea, palpitations and vomiting (Resolved)   Associated symptoms: no abdominal pain     Terri Wilson is a 33 y.o. female  with a PMH as listed below who presents to the Emergency Department complaining of constellation of symptoms all beginning about 2 days ago.  Patient states that she initially started having stomach bug symptoms consisting of nausea, vomiting and diarrhea.  This began 2 days ago and was much more severe than she has experienced in the past.  She still feels a little nauseous, but is no longer vomiting or having diarrhea.  Yesterday, she started feeling as if her heart was racing really fast.  Her husband has a pulse ox machine which was reading her heart rate as around 120 when she was standing up and moving, but when she sat down heart rate went down to the 80s.  She would frequently feel palpitations.  When she awoke this morning, she had central chest pain that was very sharp in nature that radiated along her chest wall.  It is not exertional or pleuritic.  It does not radiate to the neck or upper extremities.  Denies history of feeling this way in the past.  She denies any leg swelling or calf tenderness. Not on OCPs.  Has a 62-month-old at home and is currently breast-feeding. No recent travel/surgeries/immobilizations.    Past Medical History:  Diagnosis Date  . Allergic rhinitis   . Anxiety and depression   . Chiari malformation    7 mm  . Depression   . Gestational diabetes   . Gestational diabetes mellitus, antepartum   . Herpes labialis   . Migraine   . Panic attack      Patient Active Problem List   Diagnosis Date Noted  . Normal labor 08/01/2017  . First degree perineal laceration during delivery 08/01/2017  . SVD (spontaneous vaginal delivery) 3/14 08/01/2017  . Postpartum care following vaginal delivery 08/01/2017  . PVC (premature ventricular contraction) 09/21/2015  . Other chest pain 12/04/2014  . Cigarette smoker 12/04/2014  . ADD (attention deficit disorder) 03/17/2014  . Cerebellar tonsillar ectopia ([place]) 06/16/2013  . Migraine 04/25/2012  . AR (allergic rhinitis) 04/25/2012  . Anxiety and depression   . Panic attack     Past Surgical History:  Procedure Laterality Date  . TONSILLECTOMY AND ADENOIDECTOMY  2001  . WISDOM TOOTH EXTRACTION       OB History    Gravida  2   Para  2   Term  2   Preterm  0   AB  0   Living  2     SAB  0   TAB  0   Ectopic  0   Multiple  0   Live Births  2            Home Medications    Prior to Admission medications   Medication Sig Start Date End Date Taking? Authorizing Provider  acetaminophen (TYLENOL) 325 MG tablet Take 2 tablets (650 mg total) by mouth every 4 (four) hours as needed (for pain scale < 4).  08/03/17  Yes Juliene Pina, CNM  citalopram (CELEXA) 40 MG tablet Take 40 mg by mouth daily.   Yes [provider]  coconut oil OIL Apply 1 application topically as needed. 08/03/17  Yes Juliene Pina, CNM  ibuprofen (ADVIL,MOTRIN) 600 MG tablet Take 1 tablet (600 mg total) by mouth every 6 (six) hours. 08/03/17  Yes Derrell Lolling C, CNM  benzocaine-Menthol (DERMOPLAST) 20-0.5 % AERO Apply 1 application topically as needed for irritation (perineal discomfort). Patient not taking: Reported on 06/01/2018 08/03/17   Juliene Pina, CNM  hydrocortisone (ANUSOL-HC) 25 MG suppository Place 1 suppository (25 mg total) rectally 2 (two) times daily. Patient not taking: Reported on 06/01/2018 08/03/17   Juliene Pina, CNM  ondansetron (ZOFRAN ODT) 4 MG disintegrating  tablet Take 1 tablet (4 mg total) by mouth every 8 (eight) hours as needed for nausea or vomiting. 06/01/18   Mera Gunkel, Ozella Almond, PA-C  Prenatal Vit-Fe Fumarate-FA (PRENATAL MULTIVITAMIN) TABS tablet Take 1 tablet by mouth daily at 12 noon.    [provider]  senna-docusate (SENOKOT-S) 8.6-50 MG tablet Take 2 tablets by mouth daily. Patient not taking: Reported on 06/01/2018 08/04/17   Juliene Pina, CNM    Family History Family History  Problem Relation Age of Onset  . Asthma Mother        "allergy induced"  . Heart disease Father 61       first heart attack   . Breast cancer Maternal Grandmother   . Colon cancer Maternal Grandfather   . Hyperlipidemia Other   . Hypertension Other   . Obesity Other   . Heart disease Other   . Von Willebrand disease Sister   . Heart disease Paternal Aunt     Social History Social History   Tobacco Use  . Smoking status: Former Smoker    Packs/day: 1.00    Years: 10.00    Pack years: 10.00    Types: Cigarettes  . Smokeless tobacco: Former Network engineer Use Topics  . Alcohol use: No    Alcohol/week: 0.0 standard drinks  . Drug use: No     Allergies   Lamictal [lamotrigine] and Vancomycin   Review of Systems Review of Systems  Cardiovascular: Positive for chest pain and palpitations. Negative for leg swelling.  Gastrointestinal: Positive for diarrhea (Resolved), nausea and vomiting (Resolved). Negative for abdominal pain.  All other systems reviewed and are negative.    Physical Exam Updated Vital Signs BP 107/82 (BP Location: Left Arm)   Pulse 80   Temp 98.2 F (36.8 C) (Oral)   Resp 17   LMP 10/30/2016 (Within Weeks)   SpO2 97%   Physical Exam Vitals signs and nursing note reviewed.  Constitutional:      General: She is not in acute distress.    Appearance: She is well-developed.  HENT:     Head: Normocephalic and atraumatic.  Cardiovascular:     Rate and Rhythm: Normal rate and regular rhythm.      Heart sounds: Normal heart sounds. No murmur.     Comments: Regular rate on exam.  90-97 on monitor. Pulmonary:     Effort: Pulmonary effort is normal. No respiratory distress.     Breath sounds: Normal breath sounds.     Comments: Lungs clear to auscultation bilaterally. Abdominal:     General: There is no distension.     Palpations: Abdomen is soft.     Comments: No abdominal tenderness.  Musculoskeletal:  Comments: No lower extremity edema.  Skin:    General: Skin is warm and dry.  Neurological:     Mental Status: She is alert and oriented to person, place, and time.      ED Treatments / Results  Labs (all labs ordered are listed, but only abnormal results are displayed) Labs Reviewed  BASIC METABOLIC PANEL - Abnormal; Notable for the following components:      Result Value   Glucose, Bld 116 (*)    All other components within normal limits  URINALYSIS, ROUTINE W REFLEX MICROSCOPIC - Abnormal; Notable for the following components:   APPearance CLOUDY (*)    Leukocytes, UA TRACE (*)    All other components within normal limits  CBC  TSH  I-STAT TROPONIN, ED  I-STAT BETA HCG BLOOD, ED ([facility], [facility], [facility] ONLY)  I-STAT BETA HCG BLOOD, ED (NOT ORDERABLE)  POCT I-STAT TROPONIN I    EKG EKG Interpretation  Date/Time:  Sunday June 01 2018 14:14:58 EST Ventricular Rate:  101 PR Interval:    QRS Duration: 107 QT Interval:  347 QTC Calculation: 450 R Axis:   154 Text Interpretation:  Sinus tachycardia Right axis deviation Borderline T abnormalities, diffuse leads When compared to prior,  similar tachycardia.  No STEMI Confirmed by Antony Blackbird [phone]) on 06/01/2018 3:34:05 PM   Radiology Dg Chest 2 View  Result Date: 06/01/2018 CLINICAL DATA:  Chest pain radiating to neck and jaw beginning today. EXAM: CHEST - 2 VIEW COMPARISON:  12/18/2014 FINDINGS: The heart size and mediastinal contours are within normal limits. Both lungs are clear. Mild thoracic dextroscoliosis  remains stable. IMPRESSION: Stable exam. No active cardiopulmonary disease. Electronically Signed   By: Earle Gell M.D.   On: 06/01/2018 15:33    Procedures Procedures (including critical care time)  Medications Ordered in ED Medications  sodium chloride 0.9 % bolus 1,000 mL (0 mLs Intravenous Stopped 06/01/18 1642)  sodium chloride 0.9 % bolus 1,000 mL (0 mLs Intravenous Stopped 06/01/18 1643)  ondansetron (ZOFRAN) injection 4 mg (4 mg Intravenous Given 06/01/18 1541)  ketorolac (TORADOL) 15 MG/ML injection 15 mg (15 mg Intravenous Given 06/01/18 1552)     Initial Impression / Assessment and Plan / ED Course  I have reviewed the triage vital signs and the nursing notes.  Pertinent labs & imaging results that were available during my care of the patient were reviewed by me and considered in my medical decision making (see chart for details).    Terri Wilson is a 33 y.o. female who presents to ED for chest pain which began when she awoke early this morning as well as improving n/v/d over the last two days. Trop negative. EKG without acute ischemic changes. Heart score of 2 therefore low risk. Doubt acs.  Appears dry on exam. Mildly tachycardic with HR max of 101 seen by me in ED. Believe this is likely 2/2 dehydration. After 2L IVF heart rate now in the high 70's-80's. Other than her mild tachycardia, she has no other risk factors for PE. Feel this is unlikely, but did discuss possibility with patient. She also agrees this is likely 2/2 dehydration and does not feel we need to investigate further - I agree. On re-evaluation, patient feels much improved. She is tolerating PO and feels comfortable with discharge. We discussed reasons that she should return to the ER and she understands and agrees with this plan. PCP follow up encouraged and all questions answered.   Patient discussed with Dr. Sherry Ruffing  who agrees with treatment plan.    Final Clinical Impressions(s) / ED Diagnoses   Final  diagnoses:  Chest pain with low risk for cardiac etiology  Dehydration  Nausea and vomiting, intractability of vomiting not specified, unspecified vomiting type    ED Discharge Orders         Ordered    ondansetron (ZOFRAN ODT) 4 MG disintegrating tablet  Every 8 hours PRN     06/01/18 1739           Raegan Sipp, Ozella Almond, PA-C 06/01/18 1751    Tegeler, Gwenyth Allegra, MD 06/02/18 0005

## 2018-06-01 NOTE — Discharge Instructions (Signed)
It was my pleasure taking care of you today!   Zofran only as needed for nausea / vomiting.   You need to increase your fluid intake - stay hydrated!   Follow up with your doctor. Call tomorrow to schedule an appointment.   Return to ER for new or worsening symptoms, any additional concerns.

## 2018-06-01 NOTE — ED Notes (Signed)
Pt aware that a urine sample is needed.  Pt unable at this time.  RN notified

## 2018-06-03 NOTE — Telephone Encounter (Signed)
Lm to call office

## 2018-06-05 NOTE — Telephone Encounter (Signed)
Called patient she did not want to move app up at this time. Let her know that if she changes her mind to call and let us know. We will be happy to work on that for her.

## 2018-06-24 ENCOUNTER — Encounter

## 2018-06-24 ENCOUNTER — Encounter: Payer: Self-pay | Admitting: Family Medicine

## 2018-06-24 ENCOUNTER — Ambulatory Visit (INDEPENDENT_AMBULATORY_CARE_PROVIDER_SITE_OTHER): Payer: BLUE CROSS/BLUE SHIELD | Admitting: Family Medicine

## 2018-06-24 VITALS — BP 110/76 | HR 97 | Temp 98.6°F | Ht 67.0 in | Wt 133.0 lb

## 2018-06-24 DIAGNOSIS — F332 Major depressive disorder, recurrent severe without psychotic features: Secondary | ICD-10-CM

## 2018-06-24 DIAGNOSIS — F988 Other specified behavioral and emotional disorders with onset usually occurring in childhood and adolescence: Secondary | ICD-10-CM

## 2018-06-24 DIAGNOSIS — F17210 Nicotine dependence, cigarettes, uncomplicated: Secondary | ICD-10-CM

## 2018-06-24 DIAGNOSIS — Z8632 Personal history of gestational diabetes: Secondary | ICD-10-CM | POA: Diagnosis not present

## 2018-06-24 DIAGNOSIS — E559 Vitamin D deficiency, unspecified: Secondary | ICD-10-CM | POA: Diagnosis not present

## 2018-06-24 DIAGNOSIS — R5383 Other fatigue: Secondary | ICD-10-CM

## 2018-06-24 DIAGNOSIS — R5381 Other malaise: Secondary | ICD-10-CM | POA: Diagnosis not present

## 2018-06-24 DIAGNOSIS — Z832 Family history of diseases of the blood and blood-forming organs and certain disorders involving the immune mechanism: Secondary | ICD-10-CM | POA: Insufficient documentation

## 2018-06-24 DIAGNOSIS — Z13220 Encounter for screening for lipoid disorders: Secondary | ICD-10-CM | POA: Diagnosis not present

## 2018-06-24 DIAGNOSIS — F410 Panic disorder [episodic paroxysmal anxiety] without agoraphobia: Secondary | ICD-10-CM

## 2018-06-24 LAB — CBC WITH DIFFERENTIAL/PLATELET
Basophils Absolute: 0 10*3/uL (ref 0.0–0.1)
Basophils Relative: 0.6 % (ref 0.0–3.0)
Eosinophils Absolute: 0.1 10*3/uL (ref 0.0–0.7)
Eosinophils Relative: 2 % (ref 0.0–5.0)
HCT: 39.7 % (ref 36.0–46.0)
Hemoglobin: 13.5 g/dL (ref 12.0–15.0)
Lymphocytes Relative: 44.2 % (ref 12.0–46.0)
Lymphs Abs: 2.5 10*3/uL (ref 0.7–4.0)
MCHC: 34 g/dL (ref 30.0–36.0)
MCV: 87.5 fl (ref 78.0–100.0)
Monocytes Absolute: 0.5 10*3/uL (ref 0.1–1.0)
Monocytes Relative: 9.5 % (ref 3.0–12.0)
Neutro Abs: 2.4 10*3/uL (ref 1.4–7.7)
Neutrophils Relative %: 43.7 % (ref 43.0–77.0)
Platelets: 203 10*3/uL (ref 150.0–400.0)
RBC: 4.53 Mil/uL (ref 3.87–5.11)
RDW: 13.2 % (ref 11.5–15.5)
WBC: 5.6 10*3/uL (ref 4.0–10.5)

## 2018-06-24 LAB — LIPID PANEL
Cholesterol: 180 mg/dL (ref 0–200)
HDL: 57.1 mg/dL (ref >39.00)
LDL Cholesterol: 109 mg/dL — ABNORMAL HIGH (ref 0–99)
NonHDL: 123.13
Total CHOL/HDL Ratio: 3
Triglycerides: 71 mg/dL (ref 0.0–149.0)
VLDL: 14.2 mg/dL (ref 0.0–40.0)

## 2018-06-24 LAB — COMPREHENSIVE METABOLIC PANEL
ALT: 11 U/L (ref 0–35)
AST: 11 U/L (ref 0–37)
Albumin: 4.3 g/dL (ref 3.5–5.2)
Alkaline Phosphatase: 67 U/L (ref 39–117)
BUN: 14 mg/dL (ref 6–23)
CO2: 29 mEq/L (ref 19–32)
Calcium: 9.3 mg/dL (ref 8.4–10.5)
Chloride: 106 mEq/L (ref 96–112)
Creatinine, Ser: 0.68 mg/dL (ref 0.40–1.20)
GFR: 100.4 mL/min (ref >60.00)
Glucose, Bld: 87 mg/dL (ref 70–99)
Potassium: 4.1 mEq/L (ref 3.5–5.1)
Sodium: 141 mEq/L (ref 135–145)
Total Bilirubin: 0.5 mg/dL (ref 0.2–1.2)
Total Protein: 6.4 g/dL (ref 6.0–8.3)

## 2018-06-24 LAB — TSH: TSH: 1.47 u[IU]/mL (ref 0.35–4.50)

## 2018-06-24 LAB — HEMOGLOBIN A1C: Hgb A1c MFr Bld: 5.4 % (ref 4.6–6.5)

## 2018-06-24 LAB — VITAMIN B12: Vitamin B-12: 357 pg/mL (ref 211–911)

## 2018-06-24 LAB — VITAMIN D 25 HYDROXY (VIT D DEFICIENCY, FRACTURES): VITD: 19.08 ng/mL — ABNORMAL LOW (ref 30.00–100.00)

## 2018-06-24 MED ORDER — ALPRAZOLAM 0.5 MG PO TABS: 0.5000 mg | ORAL_TABLET | Freq: Two times a day (BID) | ORAL | 0 refills | Status: DC | PRN

## 2018-06-24 NOTE — Progress Notes (Addendum)
Terri Wilson is a 33 y.o. female is here to [facility].   Patient Care Team: Briscoe Deutscher, DO as PCP - General (Family Medicine) Obgyn, Erling Conte as Consulting Physician   History of Present Illness:   HPI: Very sweet new patient. Breastfeeding. Severe depression. Home with kids. Always tired. Secret smoker. Suicidal thoughts. No plans to act. Barrier - kids. Eats heathy foods. Exercises. Married, husband supportive. Interested in medication help.   Health Maintenance Due  Topic Date Due  . TETANUS/TDAP  09/03/2005  . PAP SMEAR-Modifier  09/04/2007  . INFLUENZA VACCINE  12/19/2017   Depression screen PHQ 2/9 06/24/2018  Decreased Interest 1  Down, Depressed, Hopeless 1  PHQ - 2 Score 2  Altered sleeping 1  Tired, decreased energy 3  Change in appetite 3  Feeling bad or failure about yourself  1  Trouble concentrating 1  Moving slowly or fidgety/restless 3  Suicidal thoughts 1  PHQ-9 Score 15  Difficult doing work/chores Extremely dIfficult    PMHx, SurgHx, SocialHx, Medications, and Allergies were reviewed in the Visit Navigator and updated as appropriate.   Past Medical History:  Diagnosis Date  . Allergic rhinitis   . Anxiety and depression   . Chiari malformation    7 mm  . Depression   . Gestational diabetes   . Herpes labialis   . Migraine   . Panic attack      Past Surgical History:  Procedure Laterality Date  . TONSILLECTOMY AND ADENOIDECTOMY  2001  . WISDOM TOOTH EXTRACTION       Family History  Problem Relation Age of Onset  . Asthma Mother   . Heart disease Father 81  . Alcohol abuse Father   . Depression Father   . Heart attack Father   . Von Willebrand disease Sister   . Cancer Sister   . Breast cancer Maternal Grandmother   . Colon cancer Maternal Grandfather   . Hyperlipidemia Other   . Hypertension Other   . Obesity Other   . Heart disease Other   . Heart disease Paternal Aunt   . Cancer Paternal Grandfather      Social History   Tobacco Use  . Smoking status: Former Smoker    Packs/day: 1.00    Years: 10.00    Pack years: 10.00    Types: Cigarettes  . Smokeless tobacco: Former Network engineer Use Topics  . Alcohol use: No    Alcohol/week: 0.0 standard drinks  . Drug use: No    Current Medications and Allergies   Current Outpatient Medications:  .  citalopram (CELEXA) 40 MG tablet, Take 40 mg by mouth daily., Disp: , Rfl:  .  ibuprofen (ADVIL,MOTRIN) 600 MG tablet, Take 1 tablet (600 mg total) by mouth every 6 (six) hours., Disp: 30 tablet, Rfl: 0 .  ALPRAZolam (XANAX) 0.5 MG tablet, Take 1 tablet (0.5 mg total) by mouth 2 (two) times daily as needed for anxiety., Disp: 60 tablet, Rfl: 0   Allergies  Allergen Reactions  . Lamictal [Lamotrigine] Anaphylaxis  . Vancomycin Other (See Comments)    Feels hot   Review of Systems   Pertinent items are noted in the HPI. Otherwise, a complete ROS is negative.  Vitals   Vitals:   06/24/18 0922  BP: 110/76  Pulse: 97  Temp: 98.6 F (37 C)  TempSrc: Oral  SpO2: 99%  Weight: 133 lb (60.3 kg)  Height: 5\' 7"  (1.702 m)     Body mass index  is 20.83 kg/m.  Physical Exam   Physical Exam Vitals signs and nursing note reviewed.  Constitutional:      General: She is not in acute distress.    Appearance: She is well-developed.  HENT:     Head: Normocephalic and atraumatic.     Right Ear: External ear normal.     Left Ear: External ear normal.     Nose: Nose normal.  Eyes:     Conjunctiva/sclera: Conjunctivae normal.     Pupils: Pupils are equal, round, and reactive to light.  Neck:     Musculoskeletal: Normal range of motion and neck supple.     Thyroid: No thyromegaly.  Cardiovascular:     Rate and Rhythm: Normal rate and regular rhythm.     Heart sounds: Normal heart sounds.  Pulmonary:     Effort: Pulmonary effort is normal.     Breath sounds: Normal breath sounds.  Abdominal:     General: Bowel sounds are normal.      Palpations: Abdomen is soft.  Musculoskeletal: Normal range of motion.  Lymphadenopathy:     Cervical: No cervical adenopathy.  Skin:    General: Skin is warm and dry.     Capillary Refill: Capillary refill takes less than 2 seconds.  Neurological:     Mental Status: She is alert and oriented to person, place, and time.  Psychiatric:        Behavior: Behavior normal.     Assessment and Plan   Terri Wilson was seen today for establish care.  Diagnoses and all orders for this visit:  Malaise and fatigue -     CBC with Differential/Platelet -     Comprehensive metabolic panel -     TSH -     Vitamin B12  Family history of von Willebrand disease  History of gestational diabetes -     Hemoglobin A1c  Cigarette smoker  Attention deficit disorder, unspecified hyperactivity presence  Severe episode of recurrent major depressive disorder, without psychotic features ([place]) Safety plan in place. Will start medication when finished with breastfeeding. Rec counseling.  Screening for lipid disorders -     Lipid panel  Vitamin D deficiency -     VITAMIN D 25 Hydroxy (Vit-D Deficiency, Fractures)  Panic attack -     ALPRAZolam (XANAX) 0.5 MG tablet; Take 1 tablet (0.5 mg total) by mouth 2 (two) times daily as needed for anxiety.   . Orders and follow up as documented in [place], reviewed diet, exercise and weight control, cardiovascular risk and specific lipid/LDL goals reviewed, reviewed medications and side effects in detail.  . Reviewed expectations re: course of current medical issues. . Outlined signs and symptoms indicating need for more acute intervention. . Patient verbalized understanding and all questions were answered. . Patient received an After Visit Summary.  Briscoe Deutscher, DO [place], [facility] 07/20/2018

## 2018-06-24 NOTE — Patient Instructions (Signed)
Unfortunately, I cannot start the medications that we discussed (Prozac and Wellbutrin) until you are finished with breastfeeding. Klonopin is too long acting, but Xanax is okay - I recommend taking it after you breastfeed. I would normally tell a woman to breast feed as long as she and her baby want, but I think that your mental health is more important. I recommend that you work on weaning over the next month, if you are willing. When you have finished, I am super happy to call in those medications.

## 2018-06-26 ENCOUNTER — Encounter: Payer: Self-pay | Admitting: Physician Assistant

## 2018-07-04 DIAGNOSIS — F332 Major depressive disorder, recurrent severe without psychotic features: Secondary | ICD-10-CM | POA: Insufficient documentation

## 2018-07-20 ENCOUNTER — Encounter: Payer: Self-pay | Admitting: Family Medicine

## 2018-07-20 DIAGNOSIS — R5383 Other fatigue: Principal | ICD-10-CM

## 2018-07-20 DIAGNOSIS — R5381 Other malaise: Secondary | ICD-10-CM | POA: Insufficient documentation

## 2018-07-22 ENCOUNTER — Encounter: Payer: Self-pay | Admitting: Family Medicine

## 2018-08-04 ENCOUNTER — Encounter: Payer: Self-pay | Admitting: Family Medicine

## 2018-08-06 ENCOUNTER — Other Ambulatory Visit: Payer: Self-pay

## 2018-08-06 DIAGNOSIS — Z803 Family history of malignant neoplasm of breast: Secondary | ICD-10-CM

## 2018-08-06 DIAGNOSIS — Z1231 Encounter for screening mammogram for malignant neoplasm of breast: Secondary | ICD-10-CM

## 2018-08-06 NOTE — Telephone Encounter (Signed)
Left message to return call to our office.  

## 2018-08-18 ENCOUNTER — Encounter: Payer: Self-pay | Admitting: Licensed Clinical Social Worker

## 2018-08-18 ENCOUNTER — Telehealth: Payer: Self-pay | Admitting: Licensed Clinical Social Worker

## 2018-08-18 NOTE — Telephone Encounter (Signed)
A genetic counseling appt has been scheduled for the pt to see Faith Rogue on 6/11 at 11am. Letter mailed.

## 2018-08-20 ENCOUNTER — Other Ambulatory Visit: Payer: Self-pay | Admitting: Family Medicine

## 2018-08-20 DIAGNOSIS — F410 Panic disorder [episodic paroxysmal anxiety] without agoraphobia: Secondary | ICD-10-CM

## 2018-08-21 MED ORDER — ALPRAZOLAM 0.5 MG PO TABS: 0.5000 mg | ORAL_TABLET | Freq: Two times a day (BID) | ORAL | 0 refills | Status: DC | PRN

## 2018-09-02 ENCOUNTER — Ambulatory Visit: Payer: BLUE CROSS/BLUE SHIELD | Admitting: Family Medicine

## 2018-09-02 ENCOUNTER — Encounter: Payer: Self-pay | Admitting: Family Medicine

## 2018-09-02 ENCOUNTER — Ambulatory Visit (INDEPENDENT_AMBULATORY_CARE_PROVIDER_SITE_OTHER): Payer: BLUE CROSS/BLUE SHIELD | Admitting: Family Medicine

## 2018-09-02 ENCOUNTER — Other Ambulatory Visit: Payer: Self-pay

## 2018-09-02 VITALS — Ht 67.0 in | Wt 133.0 lb

## 2018-09-02 DIAGNOSIS — F5101 Primary insomnia: Secondary | ICD-10-CM | POA: Diagnosis not present

## 2018-09-02 DIAGNOSIS — F411 Generalized anxiety disorder: Secondary | ICD-10-CM | POA: Diagnosis not present

## 2018-09-02 DIAGNOSIS — F3341 Major depressive disorder, recurrent, in partial remission: Secondary | ICD-10-CM

## 2018-09-02 DIAGNOSIS — F17210 Nicotine dependence, cigarettes, uncomplicated: Secondary | ICD-10-CM | POA: Diagnosis not present

## 2018-09-02 DIAGNOSIS — F410 Panic disorder [episodic paroxysmal anxiety] without agoraphobia: Secondary | ICD-10-CM

## 2018-09-02 NOTE — Progress Notes (Deleted)
   Virtual Visit via Video   I connected with Terri Wilson on 09/02/18 at 10:20 AM EDT by a video enabled telemedicine application and verified that I am speaking with the correct person using two identifiers. Location patient: Home Location provider: [facility], Office Persons participating in the virtual visit: Tesla, Keeler, DO   I discussed the limitations of evaluation and management by telemedicine and the availability of in person appointments. The patient expressed understanding and agreed to proceed.  Subjective:   HPI: ***  ROS: See pertinent positives and negatives per HPI.  Patient Active Problem List   Diagnosis Date Noted  . Malaise and fatigue 07/20/2018  . Severe recurrent major depression ([place]) 07/04/2018  . Family history of von Willebrand disease 06/24/2018  . History of gestational diabetes 06/24/2018  . PVC (premature ventricular contraction) 09/21/2015  . Pulmonary nodule 12/13/2014  . Cigarette smoker 12/04/2014  . ADD (attention deficit disorder) 03/17/2014  . Migraine 04/25/2012  . AR (allergic rhinitis) 04/25/2012  . Anxiety and depression   . Panic attack     Social History   Tobacco Use  . Smoking status: Former Smoker    Packs/day: 1.00    Years: 10.00    Pack years: 10.00    Types: Cigarettes  . Smokeless tobacco: Former Network engineer Use Topics  . Alcohol use: No    Alcohol/week: 0.0 standard drinks    Current Outpatient Medications:  .  ALPRAZolam (XANAX) 0.5 MG tablet, Take 1 tablet (0.5 mg total) by mouth 2 (two) times daily as needed for anxiety., Disp: 60 tablet, Rfl: 0 .  citalopram (CELEXA) 40 MG tablet, Take 40 mg by mouth daily., Disp: , Rfl:  .  ibuprofen (ADVIL,MOTRIN) 600 MG tablet, Take 1 tablet (600 mg total) by mouth every 6 (six) hours., Disp: 30 tablet, Rfl: 0  Allergies  Allergen Reactions  . Lamictal [Lamotrigine] Anaphylaxis  . Vancomycin Other (See Comments)    Feels hot     Objective:   VITALS: Per patient if applicable, see vitals. GENERAL: Alert, appears well and in no acute distress. HEENT: Atraumatic, conjunctiva clear, no obvious abnormalities on inspection of external nose and ears. NECK: Normal movements of the head and neck. CARDIOPULMONARY: No increased WOB. Speaking in clear sentences. I:E ratio WNL.  MS: Moves all visible extremities without noticeable abnormality. PSYCH: Pleasant and cooperative, well-groomed. Speech normal rate and rhythm. Affect is appropriate. Insight and judgement are appropriate. Attention is focused, linear, and appropriate.  NEURO: CN grossly intact. Oriented as arrived to appointment on time with no prompting. Moves both UE equally.  SKIN: No obvious lesions, wounds, erythema, or cyanosis noted on face or hands.  Assessment and Plan:   There are no diagnoses linked to this encounter.  . Reviewed expectations re: course of current medical issues. . Discussed self-management of symptoms. . Outlined signs and symptoms indicating need for more acute intervention. . Patient verbalized understanding and all questions were answered. Marland Kitchen Health Maintenance issues including appropriate healthy diet, exercise, and smoking avoidance were discussed with patient. . See orders for this visit as documented in the electronic medical record.  Briscoe Deutscher, DO 09/02/2018

## 2018-09-02 NOTE — Progress Notes (Addendum)
Virtual Visit via Video   I connected with Terri Wilson by a video enabled telemedicine application and verified that I am speaking with the correct person using two identifiers. Location patient: Home Location provider:  [facility], Office Persons participating in the virtual visit: Terri, [place], DO   I discussed the limitations of evaluation and management by telemedicine and the availability of in person appointments. The patient expressed understanding and agreed to proceed.  Subjective:   HPI: Patient is stable to improved re: depression. She was holding off on medications due to breast feeding. She was also donation milk to her sister that has just started chemo for breast cancer. She has decided that she was going to stop breast feeding due to depression. She is almost completely done with that.  She feels like the Celexa is working good right now and does not want to change.   Xanax has been incredibly helpful for anxiety and sleep.   Still working on smoking cessation. Wants to try nicotine patches.   ROS: See pertinent positives and negatives per HPI.  Patient Active Problem List   Diagnosis Date Noted  . GAD (generalized anxiety disorder) 09/04/2018  . Primary insomnia 09/04/2018  . Recurrent major depressive disorder, in partial remission ([place]) 09/04/2018  . Malaise and fatigue 07/20/2018  . Severe recurrent major depression ([place]) 07/04/2018  . Family history of von Willebrand disease 06/24/2018  . History of gestational diabetes 06/24/2018  . PVC (premature ventricular contraction) 09/21/2015  . Pulmonary nodule 12/13/2014  . Cigarette smoker 12/04/2014  . ADD (attention deficit disorder) 03/17/2014  . Migraine 04/25/2012  . AR (allergic rhinitis) 04/25/2012  . Anxiety and depression   . Panic attack     Social History   Tobacco Use  . Smoking status: Former Smoker    Packs/day: 1.00    Years: 10.00    Pack years: 10.00    Types:  Cigarettes  . Smokeless tobacco: Former Network engineer Use Topics  . Alcohol use: No    Alcohol/week: 0.0 standard drinks    Current Outpatient Medications:  .  ALPRAZolam (XANAX) 0.5 MG tablet, Take 1 tablet (0.5 mg total) by mouth 2 (two) times daily as needed for anxiety., Disp: 60 tablet, Rfl: 0 .  citalopram (CELEXA) 40 MG tablet, Take 1 tablet (40 mg total) by mouth daily., Disp: 90 tablet, Rfl: 2 .  ibuprofen (ADVIL,MOTRIN) 600 MG tablet, Take 1 tablet (600 mg total) by mouth every 6 (six) hours., Disp: 30 tablet, Rfl: 0  Allergies  Allergen Reactions  . Lamictal [Lamotrigine] Anaphylaxis  . Vancomycin Other (See Comments)    Feels hot    Objective:   VITALS: Per patient if applicable, see vitals. GENERAL: Alert, appears well and in no acute distress. HEENT: Atraumatic, conjunctiva clear, no obvious abnormalities on inspection of external nose and ears. NECK: Normal movements of the head and neck. CARDIOPULMONARY: No increased WOB. Speaking in clear sentences. I:E ratio WNL.  MS: Moves all visible extremities without noticeable abnormality. PSYCH: Pleasant and cooperative, well-groomed. Speech normal rate and rhythm. Affect is appropriate. Insight and judgement are appropriate. Attention is focused, linear, and appropriate.  NEURO: CN grossly intact. Oriented as arrived to appointment on time with no prompting. Moves both UE equally.  SKIN: No obvious lesions, wounds, erythema, or cyanosis noted on face or hands.  Assessment and Plan:   Terri Wilson was seen today for medication refill.  Diagnoses and all orders for this visit:  Recurrent  major depressive disorder, in partial remission ([place]) Comments: Improved. Continue current treatment.  Orders: -     citalopram (CELEXA) 40 MG tablet; Take 1 tablet (40 mg total) by mouth daily.  Cigarette smoker Comments: Discussed proper dose of nicotine patches.   Primary insomnia Comments: Improved with Xanax q hs.   GAD  (generalized anxiety disorder) Comments: Doing well. Discussed coping skills.   Panic attack -     ALPRAZolam (XANAX) 0.5 MG tablet; Take 1 tablet (0.5 mg total) by mouth 2 (two) times daily as needed for anxiety.    . Reviewed expectations re: course of current medical issues. . Discussed self-management of symptoms. . Outlined signs and symptoms indicating need for more acute intervention. . Patient verbalized understanding and all questions were answered. Marland Kitchen Health Maintenance issues including appropriate healthy diet, exercise, and smoking avoidance were discussed with patient. . See orders for this visit as documented in the electronic medical record.  Briscoe Deutscher, DO

## 2018-09-04 ENCOUNTER — Encounter: Payer: Self-pay | Admitting: Family Medicine

## 2018-09-04 DIAGNOSIS — F3341 Major depressive disorder, recurrent, in partial remission: Secondary | ICD-10-CM | POA: Insufficient documentation

## 2018-09-04 DIAGNOSIS — F411 Generalized anxiety disorder: Secondary | ICD-10-CM | POA: Insufficient documentation

## 2018-09-04 DIAGNOSIS — F5101 Primary insomnia: Secondary | ICD-10-CM | POA: Insufficient documentation

## 2018-09-04 MED ORDER — CITALOPRAM HYDROBROMIDE 40 MG PO TABS: 40.0000 mg | ORAL_TABLET | Freq: Every day | ORAL | 2 refills | Status: DC

## 2018-09-04 MED ORDER — ALPRAZOLAM 0.5 MG PO TABS: 0.5000 mg | ORAL_TABLET | Freq: Two times a day (BID) | ORAL | 0 refills | Status: DC | PRN

## 2018-10-14 ENCOUNTER — Ambulatory Visit
Admission: RE | Admit: 2018-10-14 | Discharge: 2018-10-14 | Disposition: A | Discharge status: Home / Self Care | Payer: BLUE CROSS/BLUE SHIELD | Source: Ambulatory Visit | Attending: Family Medicine | Admitting: Family Medicine

## 2018-10-14 ENCOUNTER — Other Ambulatory Visit: Payer: Self-pay

## 2018-10-14 DIAGNOSIS — Z1231 Encounter for screening mammogram for malignant neoplasm of breast: Secondary | ICD-10-CM

## 2018-10-15 ENCOUNTER — Other Ambulatory Visit: Payer: Self-pay | Admitting: Family Medicine

## 2018-10-15 DIAGNOSIS — R928 Other abnormal and inconclusive findings on diagnostic imaging of breast: Secondary | ICD-10-CM

## 2018-10-21 ENCOUNTER — Ambulatory Visit
Admission: RE | Admit: 2018-10-21 | Discharge: 2018-10-21 | Disposition: A | Discharge status: Home / Self Care | Payer: BLUE CROSS/BLUE SHIELD | Source: Ambulatory Visit | Attending: Family Medicine | Admitting: Family Medicine

## 2018-10-21 ENCOUNTER — Other Ambulatory Visit: Payer: Self-pay

## 2018-10-21 ENCOUNTER — Ambulatory Visit: Payer: BLUE CROSS/BLUE SHIELD

## 2018-10-21 DIAGNOSIS — N6489 Other specified disorders of breast: Secondary | ICD-10-CM | POA: Diagnosis not present

## 2018-10-21 DIAGNOSIS — R922 Inconclusive mammogram: Secondary | ICD-10-CM | POA: Diagnosis not present

## 2018-10-21 DIAGNOSIS — R928 Other abnormal and inconclusive findings on diagnostic imaging of breast: Secondary | ICD-10-CM

## 2018-10-29 ENCOUNTER — Telehealth: Payer: Self-pay | Admitting: Licensed Clinical Social Worker

## 2018-10-29 NOTE — Telephone Encounter (Signed)
Called patient regarding upcoming Web[facility] appointment, test run complete and e-mail has been sent. Patient agreed to do virtual visit so lab appointment has been cancelled.

## 2018-10-30 ENCOUNTER — Other Ambulatory Visit: Payer: BLUE CROSS/BLUE SHIELD

## 2018-10-30 ENCOUNTER — Encounter: Payer: Self-pay | Admitting: Licensed Clinical Social Worker

## 2018-10-30 ENCOUNTER — Inpatient Hospital Stay: Payer: BC Managed Care – PPO | Attending: Genetic Counselor | Admitting: Licensed Clinical Social Worker

## 2018-10-30 DIAGNOSIS — Z808 Family history of malignant neoplasm of other organs or systems: Secondary | ICD-10-CM | POA: Insufficient documentation

## 2018-10-30 DIAGNOSIS — Z8481 Family history of carrier of genetic disease: Secondary | ICD-10-CM | POA: Insufficient documentation

## 2018-10-30 DIAGNOSIS — Z803 Family history of malignant neoplasm of breast: Secondary | ICD-10-CM

## 2018-10-30 DIAGNOSIS — Z7183 Encounter for nonprocreative genetic counseling: Secondary | ICD-10-CM

## 2018-10-30 DIAGNOSIS — Z8042 Family history of malignant neoplasm of prostate: Secondary | ICD-10-CM | POA: Insufficient documentation

## 2018-10-30 NOTE — Progress Notes (Signed)
REFERRING PROVIDER: Briscoe Deutscher, [address],  [place] [address]  PRIMARY PROVIDER:  Briscoe Deutscher, DO  PRIMARY REASON FOR VISIT:  1. Family history of breast cancer   2. Family history of prostate cancer   3. Family history of melanoma   4. Family history of gene mutation    I connected with Webex on 10/30/2018 at 11:00 AM EDT by Webex and verified that I am speaking with the correct person using two identifiers.    Patient location: home Provider location: clinic  HISTORY OF PRESENT ILLNESS:   Terri Wilson, a 33 y.o. female, was seen for a [facility] cancer genetics consultation at the request of Dr. Juleen China due to a family history of cancer, and her sister's recent genetic testing that revealed an [place] pathogenic variant and FH likely pathogenic variant.  Terri Wilson presents to clinic today to discuss the possibility of a hereditary predisposition to cancer, genetic testing, and to further clarify her future cancer risks, as well as potential cancer risks for family members.    Terri Wilson is a 33 y.o. female with no personal history of cancer.    RISK FACTORS:  Menarche was at age 62.  First live birth at age 47.  OCP use for approximately 2 years.  Ovaries intact: yes.  Hysterectomy: no.  Menopausal status: premenopausal.  HRT use: 0 years. Colonoscopy: no; not examined. Mammogram within the last year: yes. Number of breast biopsies: 0. Up to date with pelvic exams: yes. Any excessive radiation exposure in the past: no  Past Medical History:  Diagnosis Date   Allergic rhinitis    Anxiety and depression    Chiari malformation    7 mm   Depression    Family history of breast cancer    Family history of gene mutation    Family history of melanoma    Family history of prostate cancer    Gestational diabetes    Herpes labialis    Migraine    Panic attack     Past Surgical History:  Procedure Laterality Date   TONSILLECTOMY  AND ADENOIDECTOMY  2001   WISDOM TOOTH EXTRACTION      Social History   Socioeconomic History   Marital status: Married    Spouse name: [name]   Number of children: 1   Years of education: 14   Highest education level: Not on file  Occupational History   Occupation: Administrator: [employer]    Comment: [employer]  Social Designer, fashion/clothing strain: Not on file   Food insecurity    Worry: Not on file    Inability: Not on file   Transportation needs    Medical: Not on file    Non-medical: Not on file  Tobacco Use   Smoking status: Former Smoker    Packs/day: 1.00    Years: 10.00    Pack years: 10.00    Types: Cigarettes   Smokeless tobacco: Former Network engineer and Sexual Activity   Alcohol use: No    Alcohol/week: 0.0 standard drinks   Drug use: No   Sexual activity: Yes    Partners: Male  Lifestyle   Physical activity    Days per week: Not on file    Minutes per session: Not on file   Stress: Not on file  Relationships   Social connections    Talks on phone: Not on file    Gets together: Not on file  Attends religious service: Not on file    Active member of club or organization: Not on file    Attends meetings of clubs or organizations: Not on file    Relationship status: Not on file  Other Topics Concern   Not on file  Social History Narrative   Lives with husband. Caffeine Use: 40 mg/day.     FAMILY HISTORY:  We obtained a detailed, 4-generation family history.  Significant diagnoses are listed below: Family History  Problem Relation Age of Onset   Asthma Mother    Heart disease Father 88   Alcohol abuse Father    Depression Father    Heart attack Father    Von Willebrand disease Sister    Cancer Sister    Breast cancer Maternal Grandmother    Colon cancer Maternal Grandfather    Hyperlipidemia Other    Hypertension Other    Obesity Other    Heart disease Other    Heart disease Paternal  Aunt    Cancer Paternal Grandfather    Terri Wilson has a son and a daughter, no cancers. She has one sister who was diagnosed with breast cancer at 76 and had genetic testing that revealed an SDHA pathogenic variant as well as an FH likely pathogenic variant. Her sister had 3 sons.   Terri Wilson mother had a melanoma at 57 and is living at 5. Patient has 1 maternal uncle and 1 maternal aunt. Her uncle had prostate cancer at 55. She has a maternal cousin who had testicular cancer at 2. Her maternal grandmother had breast cancer at 23, died at 42. Her maternal grandfather had prostate cancer and died at 57.  Terri Wilson father died at 72 by suicide. She has 1 paternal aunt, 1 paternal uncle. Her aunt had breast cancer at 73 and no genetic testing. No cancers in her paternal cousins. Her paternal grandmother is living at 67, paternal grandfather died at 27 and had myeloma. Her grandmother's sister, the patient's great aunt, had breast cancer at 43, and this great aunt had a daughter who had breast cancer at 14 and reportedly negative genetic testing.   Terri Wilson is aware of previous family history of genetic testing for hereditary cancer risks.There is no reported Ashkenazi Jewish ancestry. There is no known consanguinity.  GENETIC COUNSELING ASSESSMENT: Terri Wilson is a 32 y.o. female with a family history of breast cancer and a family history of SDHA pathogenic variant and FH likely pathogenic variant. We, therefore, discussed and recommended the following at today's visit.   DISCUSSION: We discussed her sister's results in detail, noting that the results do not explain the family history of breast cancer. We talked about the SDHA gene, paragangliomas, pheochromocytomas, GISTs, and potential management should she test positive for this variant. We also discussed the FH gene, including the features of HLRCC such as renal cell cancer, cutaneous leiomyomas and uterine leiomyomas.  Potential management could include MRIs, dermatology exams and gyn exams. We discussed autosomal dominant inheritance and recessive inheritance, as the SDHA gene and FH gene both have recessive conditons associated with them as well (Mitochondrial complex II deficiency and fumurate hydratase deficiency). Terri Wilson has a 50% chance to have inherited each of the variants found in her sister.   We reviewed the characteristics, features and inheritance patterns of hereditary cancer syndromes. We also discussed genetic testing, including the appropriate family members to test, the process of testing, insurance coverage and turn-around-time for results. We discussed the implications of a  negative, positive and/or variant of uncertain significant result. We offered the option of purusing familial variant testing vs a panel. Given the family history of cancer that is unexplained by her sister's result, the patient elected to pursue genetic testing for the [facility] Multi-Cancer gene panel.   The Multi-Cancer Panel offered by [facility] includes sequencing and/or deletion duplication testing of the following 85 genes: AIP, ALK, APC, ATM, AXIN2,BAP1,  BARD1, BLM, BMPR1A, BRCA1, BRCA2, BRIP1, CASR, CDC73, CDH1, CDK4, CDKN1B, CDKN1C, CDKN2A (p14ARF), CDKN2A (p16INK4a), CEBPA, CHEK2, CTNNA1, DICER1, DIS3L2, EGFR (c.2369C>T, p.Thr790Met variant only), EPCAM (Deletion/duplication testing only), FH, FLCN, GATA2, GPC3, GREM1 (Promoter region deletion/duplication testing only), HOXB13 (c.251G>A, p.Gly84Glu), HRAS, KIT, MAX, MEN1, MET, MITF (c.952G>A, p.Glu318Lys variant only), MLH1, MSH2, MSH3, MSH6, MUTYH, NBN, NF1, NF2, NTHL1, PALB2, PDGFRA, PHOX2B, PMS2, POLD1, POLE, POT1, PRKAR1A, PTCH1, PTEN, RAD50, RAD51C, RAD51D, RB1, RECQL4, RET, RNF43, RUNX1, SDHAF2, SDHA (sequence changes only), SDHB, SDHC, SDHD, SMAD4, SMARCA4, SMARCB1, SMARCE1, STK11, SUFU, TERC, TERT, TMEM127, TP53, TSC1, TSC2, VHL, WRN and WT1.   Based on Ms.  Wilson's family history of SDHA+, FH+ and  cancer, she meets medical criteria for genetic testing. Despite that she meets criteria, she may still have an out of pocket cost. The lab will notify her of an OOP if any.  Based on the patient's family history, a statistical model Air cabin crew) was used to estimate her risk of developing breast cancer. This estimates her lifetime risk of developing breast cancer to be approximately 23%. This estimation does not consider any genetic testing results.  The patient's lifetime breast cancer risk is a preliminary estimate based on available information using one of several models endorsed by the [employer] (ACS). The ACS recommends consideration of breast MRI screening as an adjunct to mammography for patients at high risk (defined as 20% or greater lifetime risk).     Terri Wilson has been determined to be at high risk for breast cancer.  Therefore, we recommend that annual screening with mammography and breast MRI be performed.   We discussed that Terri Wilson should discuss her individual situation with her referring physician and determine a breast cancer screening plan with which they are both comfortable.    PLAN: After considering the risks, benefits, and limitations, Terri Wilson provided informed consent to pursue genetic testing. A saliva kit was mailed to her home and she will mail this to [facility] for analysis of the Multi-Cancer Panel. Results should be available within approximately 2-3 weeks' time, at which point they will be disclosed by telephone to Terri Wilson, as will any additional recommendations warranted by these results. Terri Wilson will receive a summary of her genetic counseling visit and a copy of her results once available. This information will also be available in Epic.   Based on Terri Wilson's family history, we recommended her mother and other relatives have genetic counseling and testing. Ms.  Wilson will let us know if we can be of any assistance in coordinating genetic counseling and/or testing for this family member.   Lastly, we encouraged Terri Wilson to remain in contact with cancer genetics annually so that we can continuously update the family history and inform her of any changes in cancer genetics and testing that may be of benefit for this family.   Terri Wilson questions were answered to her satisfaction today. Our contact information was provided should additional questions or concerns arise. Thank you for the referral and allowing [place] to share in the care of your  patient.   Faith Rogue, MS Genetic Counselor [email or web address] Phone: [phone]  The patient was seen for a total of 40 minutes in virtual genetic counseling.  Drs. Magrinat, Lindi Adie and/or Burr Medico were available for discussion regarding this case.

## 2018-11-03 DIAGNOSIS — Z803 Family history of malignant neoplasm of breast: Secondary | ICD-10-CM | POA: Diagnosis not present

## 2018-11-03 DIAGNOSIS — Z808 Family history of malignant neoplasm of other organs or systems: Secondary | ICD-10-CM | POA: Diagnosis not present

## 2018-11-03 DIAGNOSIS — Z8481 Family history of carrier of genetic disease: Secondary | ICD-10-CM | POA: Diagnosis not present

## 2018-11-03 DIAGNOSIS — Z8042 Family history of malignant neoplasm of prostate: Secondary | ICD-10-CM | POA: Diagnosis not present

## 2018-11-04 ENCOUNTER — Telehealth: Payer: Self-pay | Admitting: Physical Therapy

## 2018-11-04 ENCOUNTER — Other Ambulatory Visit: Payer: Self-pay

## 2018-11-04 ENCOUNTER — Ambulatory Visit: Payer: BLUE CROSS/BLUE SHIELD | Admitting: Physician Assistant

## 2018-11-04 ENCOUNTER — Emergency Department (HOSPITAL_COMMUNITY): Payer: BC Managed Care – PPO

## 2018-11-04 ENCOUNTER — Emergency Department (HOSPITAL_COMMUNITY)
Admission: EM | Admit: 2018-11-04 | Discharge: 2018-11-04 | Disposition: A | Discharge status: Home / Self Care | Payer: BC Managed Care – PPO | Attending: Emergency Medicine | Admitting: Emergency Medicine

## 2018-11-04 DIAGNOSIS — R079 Chest pain, unspecified: Secondary | ICD-10-CM | POA: Diagnosis not present

## 2018-11-04 DIAGNOSIS — Z79899 Other long term (current) drug therapy: Secondary | ICD-10-CM | POA: Diagnosis not present

## 2018-11-04 DIAGNOSIS — R0602 Shortness of breath: Secondary | ICD-10-CM | POA: Diagnosis not present

## 2018-11-04 DIAGNOSIS — Z87891 Personal history of nicotine dependence: Secondary | ICD-10-CM | POA: Insufficient documentation

## 2018-11-04 DIAGNOSIS — R5383 Other fatigue: Secondary | ICD-10-CM | POA: Diagnosis not present

## 2018-11-04 LAB — BASIC METABOLIC PANEL
Anion gap: 10 (ref 5–15)
BUN: 13 mg/dL (ref 6–20)
CO2: 25 mmol/L (ref 22–32)
Calcium: 9.4 mg/dL (ref 8.9–10.3)
Chloride: 105 mmol/L (ref 98–111)
Creatinine, Ser: 0.66 mg/dL (ref 0.44–1.00)
GFR calc Af Amer: >60 mL/min (ref >60)
GFR calc non Af Amer: >60 mL/min (ref >60)
Glucose, Bld: 113 mg/dL — ABNORMAL HIGH (ref 70–99)
Potassium: 3.8 mmol/L (ref 3.5–5.1)
Sodium: 140 mmol/L (ref 135–145)

## 2018-11-04 LAB — TSH: TSH: 1.641 u[IU]/mL (ref 0.350–4.500)

## 2018-11-04 LAB — TROPONIN I: Troponin I: <0.03 ng/mL (ref <0.03)

## 2018-11-04 LAB — CBC
HCT: 43 % (ref 36.0–46.0)
Hemoglobin: 14.5 g/dL (ref 12.0–15.0)
MCH: 30.5 pg (ref 26.0–34.0)
MCHC: 33.7 g/dL (ref 30.0–36.0)
MCV: 90.5 fL (ref 80.0–100.0)
Platelets: 230 10*3/uL (ref 150–400)
RBC: 4.75 MIL/uL (ref 3.87–5.11)
RDW: 12.5 % (ref 11.5–15.5)
WBC: 7.4 10*3/uL (ref 4.0–10.5)
nRBC: 0.3 % — ABNORMAL HIGH (ref 0.0–0.2)

## 2018-11-04 LAB — T4, FREE: Free T4: 0.81 ng/dL (ref 0.61–1.12)

## 2018-11-04 LAB — D-DIMER, QUANTITATIVE: D-Dimer, Quant: 0.33 ug/mL-FEU (ref 0.00–0.50)

## 2018-11-04 MED ORDER — LORAZEPAM 0.5 MG PO TABS
0.5000 mg | ORAL_TABLET | Freq: Once | ORAL | Status: DC
  Filled 2018-11-04: qty 1

## 2018-11-04 MED ORDER — ALBUTEROL SULFATE HFA 108 (90 BASE) MCG/ACT IN AERS
INHALATION_SPRAY | RESPIRATORY_TRACT | Status: AC
  Administered 2018-11-04: 2
  Filled 2018-11-04: qty 6.7

## 2018-11-04 NOTE — Telephone Encounter (Signed)
Called patient she has had increasing chest pressure and shortness of breath with exertion. She has had some nausea  for about a week with no vomiting. She has not had any congestion or fever but has had ongoing dry cough. She has history of pvc but has not been on meds or seen cardiology in over 5 yrs. She states that she has had symptoms in the past but have gotten much stronger and more frequent in the last three weeks. She is not able to sweep the floor with out extreme fatigue and shortness of breath. Patient advised to go to ED. She agrees will call if any further issues. She will call to make an appointment for follow up in the next few days.

## 2018-11-04 NOTE — Telephone Encounter (Signed)
Called patient back answered questions

## 2018-11-04 NOTE — ED Triage Notes (Signed)
Per Pt: Pt called her PCP for a cardiologist consult and her PCP sent her here. Pt reports a hx of PVC's and other "minor" cardiac issues.  Pt reports difficulty catching her breath and reports chest discomfort.

## 2018-11-04 NOTE — Discharge Instructions (Signed)
You were seen in the ER for shortness of breath.   Work up today was vastly reassuring.  The cause of your symptoms is unclear.   We have given you a prescription for albuterol for comfort.   Follow up with your primary care doctor for further discussion of your symptoms.

## 2018-11-04 NOTE — Telephone Encounter (Signed)
Agree with the with plan and advice given.

## 2018-11-04 NOTE — ED Provider Notes (Signed)
[facility] DEPT Provider Note   CSN: [id number] Arrival date & time: 11/04/18  1728    History   Chief Complaint Chief Complaint  Patient presents with  . Chest Pain  . Shortness of Breath    HPI Terri Wilson is a 33 y.o. female with h/o panic disorder, anxiety, depression, PVCs, migraines presents to the ER for evaluation of shortness of breath "breathlessness" for 2-3 weeks, progressively worsening.  She feels more winded.  States that she can barely read a book to her children, sweep floors, talk without being breathless.  Shortness of breath is worse with exertion but also when she is laying down to rest.  Has had associated extreme fatigue, exhaustion, "foggy brain", decreased appetite, intermittent nausea, feeling more cold than usual and intermittent night sweats.  She has lost 10 to 12 pounds in the last 3 to 4 months, unintentional normal but admits that she has been eating less because she has no appetite.  States she has had chest discomfort for years that feels like a "pressure and tightness" in the center of her chest that comes and goes, exertional but also at rest.  This chest discomfort has not necessarily gotten worse but now she is concerned because of the shortness of breath.  She has a history of anxiety and this is not typical of her anxiety attacks.  She was diagnosed with PVCs by cardiologist 5 years ago and was prescribed a beta-blocker but she did not want to take it.  She called her PCP for referral to a cardiologist but PCP recommended ER evaluation.  States she did not want to come to the ER for this.  She denies any fevers, congestion, cough.  No palpitations, orthopnea, leg swelling.  No history of DVT/PE, hormone therapy, recent surgeries or prolonged immobilization, hemoptysis, cancer history.  No history of anemia.  Had recent pregnancy March 2019 and menstrual cycles have been shorter and lighter.  No interventions.  No  modifying factors.    HPI  Past Medical History:  Diagnosis Date  . Allergic rhinitis   . Anxiety and depression   . Chiari malformation    7 mm  . Depression   . Family history of breast cancer   . Family history of gene mutation   . Family history of melanoma   . Family history of prostate cancer   . Gestational diabetes   . Herpes labialis   . Migraine   . Panic attack     Patient Active Problem List   Diagnosis Date Noted  . Family history of breast cancer   . Family history of prostate cancer   . Family history of melanoma   . Family history of gene mutation   . GAD (generalized anxiety disorder) 09/04/2018  . Primary insomnia 09/04/2018  . Recurrent major depressive disorder, in partial remission ([facility]) 09/04/2018  . Malaise and fatigue 07/20/2018  . Severe recurrent major depression ([facility]) 07/04/2018  . Family history of von Willebrand disease 06/24/2018  . History of gestational diabetes 06/24/2018  . PVC (premature ventricular contraction) 09/21/2015  . Pulmonary nodule 12/13/2014  . Cigarette smoker 12/04/2014  . ADD (attention deficit disorder) 03/17/2014  . Migraine 04/25/2012  . AR (allergic rhinitis) 04/25/2012  . Anxiety and depression   . Panic attack     Past Surgical History:  Procedure Laterality Date  . TONSILLECTOMY AND ADENOIDECTOMY  2001  . WISDOM TOOTH EXTRACTION       OB History  Gravida  2   Para  2   Term  2   Preterm  0   AB  0   Living  2     SAB  0   TAB  0   Ectopic  0   Multiple  0   Live Births  2            Home Medications    Prior to Admission medications   Medication Sig Start Date End Date Taking? Authorizing Provider  ALPRAZolam Duanne Moron) 0.5 MG tablet Take 1 tablet (0.5 mg total) by mouth 2 (two) times daily as needed for anxiety. 09/04/18  Yes Briscoe Deutscher, DO  citalopram (CELEXA) 40 MG tablet Take 1 tablet (40 mg total) by mouth daily. Patient taking differently: Take 40 mg by mouth at  bedtime.  09/04/18  Yes Briscoe Deutscher, DO  ibuprofen (ADVIL) 200 MG tablet Take 800 mg by mouth every 6 (six) hours as needed for mild pain.   Yes [provider]  Multiple Vitamins-Minerals (MULTIVITAMIN WOMEN) TABS Take 1 tablet by mouth daily.   Yes [provider]  ibuprofen (ADVIL,MOTRIN) 600 MG tablet Take 1 tablet (600 mg total) by mouth every 6 (six) hours. Patient not taking: Reported on 11/04/2018 08/03/17   Juliene Pina, CNM    Family History Family History  Problem Relation Age of Onset  . Asthma Mother   . Heart disease Father 10  . Alcohol abuse Father   . Depression Father   . Heart attack Father   . Von Willebrand disease Sister   . Cancer Sister   . Breast cancer Maternal Grandmother   . Colon cancer Maternal Grandfather   . Hyperlipidemia Other   . Hypertension Other   . Obesity Other   . Heart disease Other   . Heart disease Paternal Aunt   . Cancer Paternal Grandfather     Social History Social History   Tobacco Use  . Smoking status: Former Smoker    Packs/day: 1.00    Years: 10.00    Pack years: 10.00    Types: Cigarettes  . Smokeless tobacco: Former Network engineer Use Topics  . Alcohol use: No    Alcohol/week: 0.0 standard drinks  . Drug use: No     Allergies   Lamictal [lamotrigine] and Vancomycin   Review of Systems Review of Systems  Constitutional: Positive for fatigue.  Respiratory: Positive for shortness of breath.   Cardiovascular: Positive for chest pain.  Gastrointestinal: Positive for nausea.  All other systems reviewed and are negative.    Physical Exam Updated Vital Signs BP 117/84   Pulse 81   Temp 98.2 F (36.8 C) (Oral)   Resp (!) 21   LMP 10/29/2018 (Exact Date)   SpO2 99%   Breastfeeding Yes   Physical Exam Vitals signs and nursing note reviewed.  Constitutional:      General: She is not in acute distress.    Appearance: She is well-developed.     Comments: NAD.  Appears slightly  anxious.  Frequently takes deep breaths during conversation.  HENT:     Head: Normocephalic and atraumatic.     Right Ear: External ear normal.     Left Ear: External ear normal.     Nose: Nose normal.  Eyes:     General: No scleral icterus.    Conjunctiva/sclera: Conjunctivae normal.  Neck:     Musculoskeletal: Normal range of motion and neck supple.  Cardiovascular:  Rate and Rhythm: Normal rate and regular rhythm.     Heart sounds: Normal heart sounds.     Comments: 1+ radial and DP pulses bilaterally.  No lower extremity edema.  No calf tenderness.  No murmurs. Pulmonary:     Effort: Pulmonary effort is normal.     Breath sounds: Normal breath sounds. No wheezing.     Comments: Speaking in full sentences.  No significant respiratory distress or increased work of breathing.  No chest wall tenderness. Abdominal:     General: Abdomen is flat.     Tenderness: There is no abdominal tenderness.     Comments: No epigastric tenderness.  Musculoskeletal: Normal range of motion.        General: No deformity.  Skin:    General: Skin is warm and dry.     Capillary Refill: Capillary refill takes less than 2 seconds.  Neurological:     Mental Status: She is alert and oriented to person, place, and time.  Psychiatric:        Behavior: Behavior normal.        Thought Content: Thought content normal.        Judgment: Judgment normal.      ED Treatments / Results  Labs (all labs ordered are listed, but only abnormal results are displayed) Labs Reviewed  BASIC METABOLIC PANEL - Abnormal; Notable for the following components:      Result Value   Glucose, Bld 113 (*)    All other components within normal limits  CBC - Abnormal; Notable for the following components:   nRBC 0.3 (*)    All other components within normal limits  TROPONIN I  T4, FREE  D-DIMER, QUANTITATIVE (NOT AT [facility])  TSH    EKG EKG Interpretation  Date/Time:  Tuesday November 04 2018 17:38:27 EDT Ventricular  Rate:  79 PR Interval:    QRS Duration: 117 QT Interval:  402 QTC Calculation: 461 R Axis:   -98 Text Interpretation:  Sinus rhythm LAD, consider left anterior fascicular block Low voltage, precordial leads Baseline wander in lead(s) I III aVL V1 No significant change since last tracing Confirmed by Blanchie Dessert [phone]) on 11/04/2018 6:49:36 PM   Radiology Dg Chest 2 View  Result Date: 11/04/2018 CLINICAL DATA:  Shortness of breath EXAM: CHEST - 2 VIEW COMPARISON:  10/31/2018 FINDINGS: The heart size and mediastinal contours are within normal limits. Both lungs are clear. The visualized skeletal structures are unremarkable. IMPRESSION: No active cardiopulmonary disease. Electronically Signed   By: Constance Holster M.D.   On: 11/04/2018 18:44    Procedures Procedures (including critical care time)  Medications Ordered in ED Medications  LORazepam (ATIVAN) tablet 0.5 mg (0.5 mg Oral Refused 11/04/18 2047)  albuterol (VENTOLIN HFA) 108 (90 Base) MCG/ACT inhaler (2 puffs  Given 11/04/18 2046)     Initial Impression / Assessment and Plan / ED Course  I have reviewed the triage vital signs and the nursing notes.  Pertinent labs & imaging results that were available during my care of the patient were reviewed by me and considered in my medical decision making (see chart for details).  Clinical Course as of Nov 04 2110  Tue Nov 04, 2018  2047 Sinus rhythm LAD, consider left anterior fascicular block Low voltage, precordial leads Baseline wander in lead(s) I III aVL V1 No significant change since last tracing Confirmed by Blanchie Dessert [phone]) on 11/04/2018 6:49:36 PM  ED EKG [CG]    Clinical Course User Index [CG]  Kinnie Feil, PA-C      33 year old is here with shortness of breath for 3 weeks.  Exertional but also at rest.  Associated with exhaustion, fatigue, decreased appetite, feeling more cold, weight loss.  She has had chest discomfort for several years,  unchanged. H/o PVCs untreated.  Exam is benign. NSR. No signs of hypervolemia. No LE edema or calf tenderness. No respiratory distress. Appears slightly anxious.   Ddx includes anemia vs thyroid process. Chest discomfort is chronic, atypical, non pleuritic. Chronicity of symptoms, lack of pleuritic CP doubt PE. Wells score is 0, will add d-dimer.  No h/o HF, no signs of hypervolemia and HF exacerbation unlikely. No infectious symptoms to cause PNA, COVID. No exposures to COVID.   Work up vastly reassuring. D-dimer negative. Trop undetectable, her chest discomfort has been present for more than 3 hours and repeat not indicated. I doubt ACS. Hgb normal. T4 normal, pending TSH. CXR unremarkable. EKG without ischemia.    Pt given albuterol for comfort. Discussed work up so far. Etiology unclear but doubt life threatening process. Will dc with supportive care and strict return precautions. She asked for albuterol for comfort, rx given. Encouraged PCP f/u for persistent symptoms.  Final Clinical Impressions(s) / ED Diagnoses   Final diagnoses:  Shortness of breath    ED Discharge Orders    None       Arlean Hopping 11/04/18 2113    Blanchie Dessert, MD 11/05/18 2249

## 2018-11-04 NOTE — Telephone Encounter (Signed)
Copied from [place] [phone]. Topic: General - Other >> Nov 04, 2018 12:34 PM Carolyn Stare wrote: Pt said she spoke to [facility] and she call to ask if she will call er back

## 2018-11-04 NOTE — Telephone Encounter (Signed)
Copied from [place] [phone]. Topic: Appointment Scheduling - Scheduling Inquiry for Clinic >> Nov 04, 2018 11:20 AM Lennox Solders wrote: Reason for CRM: pt has been having sob and chest pains for over 2 wks. Pt would like a referral to cardiologist. Pt last seen cardiologist was about 6 yrs ago. Pt suppose to be taking beta blocker

## 2018-11-11 ENCOUNTER — Ambulatory Visit: Payer: BLUE CROSS/BLUE SHIELD | Admitting: Family Medicine

## 2018-11-12 ENCOUNTER — Ambulatory Visit: Payer: Self-pay | Admitting: Licensed Clinical Social Worker

## 2018-11-12 ENCOUNTER — Encounter: Payer: Self-pay | Admitting: Licensed Clinical Social Worker

## 2018-11-12 ENCOUNTER — Telehealth: Payer: Self-pay | Admitting: Licensed Clinical Social Worker

## 2018-11-12 DIAGNOSIS — Z1379 Encounter for other screening for genetic and chromosomal anomalies: Secondary | ICD-10-CM | POA: Insufficient documentation

## 2018-11-12 DIAGNOSIS — Z8481 Family history of carrier of genetic disease: Secondary | ICD-10-CM

## 2018-11-12 DIAGNOSIS — Z803 Family history of malignant neoplasm of breast: Secondary | ICD-10-CM

## 2018-11-12 DIAGNOSIS — Z8042 Family history of malignant neoplasm of prostate: Secondary | ICD-10-CM

## 2018-11-12 DIAGNOSIS — Z808 Family history of malignant neoplasm of other organs or systems: Secondary | ICD-10-CM

## 2018-11-12 NOTE — Progress Notes (Signed)
HPI:  Terri Wilson was previously seen in the [place] clinic due to a family history of breast cancer and her sister's recent genetic testing that revealed SDHA and FH mutations. Please refer to our prior cancer genetics clinic note for more information regarding our discussion, assessment and recommendations, at the time. Terri Wilson recent genetic test results were disclosed to her, as were recommendations warranted by these results. These results and recommendations are discussed in more detail below.  CANCER HISTORY:  Oncology History   No history exists.    FAMILY HISTORY:  We obtained a detailed, 4-generation family history.  Significant diagnoses are listed below: Family History  Problem Relation Age of Onset  . Asthma Mother   . Heart disease Father 51  . Alcohol abuse Father   . Depression Father   . Heart attack Father   . Von Willebrand disease Sister   . Cancer Sister   . Breast cancer Maternal Grandmother   . Colon cancer Maternal Grandfather   . Hyperlipidemia Other   . Hypertension Other   . Obesity Other   . Heart disease Other   . Heart disease Paternal Aunt   . Cancer Paternal Grandfather     [name] has a son and a daughter, no cancers. She has one sister who was diagnosed with breast cancer at 57 and had genetic testing that revealed an SDHA pathogenic variant as well as an FH likely pathogenic variant. Her sister had 3 sons.   Terri Wilson mother had a melanoma at 18 and is living at 38. Patient has 1 maternal uncle and 1 maternal aunt. Her uncle had prostate cancer at 73. She has a maternal cousin who had testicular cancer at 59. Her maternal grandmother had breast cancer at 80, died at 32. Her maternal grandfather had prostate cancer and died at 72.  Terri Wilson father died at 77 by suicide. She has 1 paternal aunt, 1 paternal uncle. Her aunt had breast cancer at 21 and no genetic testing. No cancers in her paternal  cousins. Her paternal grandmother is living at 86, paternal grandfather died at 54 and had myeloma. Her grandmother's sister, the patient's great aunt, had breast cancer at 35, and this great aunt had a daughter who had breast cancer at 66 and reportedly negative genetic testing.   Terri Wilson is aware of previous family history of genetic testing for hereditary cancer risks.There is no reported Ashkenazi Jewish ancestry. There is no known consanguinity.  GENETIC TEST RESULTS: Genetic testing reported out on 11/12/2018 through the [facility] Multi- cancer panel found no pathogenic mutations.   The Multi-Cancer Panel offered by [facility] includes sequencing and/or deletion duplication testing of the following 85 genes: AIP, ALK, APC, ATM, AXIN2,BAP1,  BARD1, BLM, BMPR1A, BRCA1, BRCA2, BRIP1, CASR, CDC73, CDH1, CDK4, CDKN1B, CDKN1C, CDKN2A (p14ARF), CDKN2A (p16INK4a), CEBPA, CHEK2, CTNNA1, DICER1, DIS3L2, EGFR (c.2369C>T, p.Thr790Met variant only), EPCAM (Deletion/duplication testing only), FH, FLCN, GATA2, GPC3, GREM1 (Promoter region deletion/duplication testing only), HOXB13 (c.251G>A, p.Gly84Glu), HRAS, KIT, MAX, MEN1, MET, MITF (c.952G>A, p.Glu318Lys variant only), MLH1, MSH2, MSH3, MSH6, MUTYH, NBN, NF1, NF2, NTHL1, PALB2, PDGFRA, PHOX2B, PMS2, POLD1, POLE, POT1, PRKAR1A, PTCH1, PTEN, RAD50, RAD51C, RAD51D, RB1, RECQL4, RET, RNF43, RUNX1, SDHAF2, SDHA (sequence changes only), SDHB, SDHC, SDHD, SMAD4, SMARCA4, SMARCB1, SMARCE1, STK11, SUFU, TERC, TERT, TMEM127, TP53, TSC1, TSC2, VHL, WRN and WT1.   The test report has been scanned into EPIC and is located under the Molecular Pathology section of the Results Review tab.  A portion of the result report is included below for reference.      We recommended Terri Wilson pursue testing for the familial hereditary cancer gene mutations called SDHA c.91c>T and FH [id number].  Terri Wilson test was normal and did not reveal the familial mutations. We  call this result a true negative result because cancer-causing mutations were identified in Ms. Arrasmith's family, and she did not inherit it.  Given this negative result, [name]'s chances of developing SDHA/FH-related cancers are the same as they are in the general population.  We did discuss that we still do not have a hereditary explanation for the breast cancer in her family, and that there could still be a gene we have not discovered yet that is responsible for these cancer diagnoses.  ADDITIONAL GENETIC TESTING: We discussed with Terri Wilson that her genetic testing was fairly extensive.  If there are genes identified to increase cancer risk that can be analyzed in the future, we would be happy to discuss and coordinate this testing at that time.    CANCER SCREENING RECOMMENDATIONS: Terri Wilson test result is considered negative (normal).  This means that we have not identified the known familial mutations in her.   While reassuring, this does not definitively rule out a hereditary predisposition to cancer. It is still possible that there could be genetic mutations that are undetectable by current technology. There could be genetic mutations in genes that have not been tested or identified to increase cancer risk.  Therefore, it is recommended she continue to follow the cancer management and screening guidelines provided by her primary healthcare provider.   An individual's cancer risk and medical management are not determined by genetic test results alone. Overall cancer risk assessment incorporates additional factors, including personal medical history, family history, and any available genetic information that may result in a personalized plan for cancer prevention and surveillance  Based on Terri Wilson's personal and family history of cancer, as well as her genetic test results, statistical models [name]) was used to estimate her risk of developing breast cancer. These  estimate her lifetime risk of developing breast cancer to be approximately 27.9%.  The patient's lifetime breast cancer risk is a preliminary estimate based on available information using one of several models endorsed by the [place] (ACS). The ACS recommends consideration of breast MRI screening as an adjunct to mammography for patients at high risk (defined as 20% or greater lifetime risk). A more detailed breast cancer risk assessment can be considered, if clinically indicated.     Ms. Nakama has been determined to be at high risk for breast cancer.  Therefore, we recommend that annual screening with mammography and breast MRI begin at age 64, or 10 years prior to the age of breast cancer diagnosis in a relative (whichever is earlier).  We discussed that Ms. Hegg should discuss her individual situation with her referring physician and determine a breast cancer screening plan with which they are both comfortable.    RECOMMENDATIONS FOR FAMILY MEMBERS:  Relatives in this family might be at some increased risk of developing cancer, over the general population risk, simply due to the family history of cancer.  We recommended female relatives in this family have a yearly mammogram beginning at age 81, or 21 years younger than the earliest onset of cancer, an annual clinical breast exam, and perform monthly breast self-exams. Female relatives in this family should also have a gynecological exam as recommended by their primary  provider. All family members should have a colonoscopy by age 15, or as directed by their physicians.  Based on Ms. Loconte's family history, we recommended her mother and paternal relatives have genetic counseling and testing for both the known familial mutations, and in the paternal relatives' case, perhaps a full panel given her paternal aunt with young breast cancer. Ms. Schmelzer will let us know if we can be of any assistance in coordinating genetic  counseling and/or testing for these family members.  FOLLOW-UP: Lastly, we discussed with Ms. Deamer that cancer genetics is a rapidly advancing field and it is possible that new genetic tests will be appropriate for her and/or her family members in the future. We encouraged her to remain in contact with cancer genetics on an annual basis so we can update her personal and family histories and let her know of advances in cancer genetics that may benefit this family.   Our contact number was provided. Ms. Radich questions were answered to her satisfaction, and she knows she is welcome to call us at anytime with additional questions or concerns.   Faith Rogue, MS Genetic Counselor [email or web address] Phone: [phone]

## 2018-11-12 NOTE — Telephone Encounter (Signed)
Revealed negative genetic testing. The known familial mutations in [facility] [place] were not identified in her. Revealed that a VUS in POLD1 was identified. This normal result is reassuring. It is unlikely that there is an increased risk of another cancer due to a mutation in one of these genes.  However, genetic testing is not perfect, and cannot definitively rule out a hereditary cause.  It will be important for her to keep in contact with genetics to learn if any additional testing may be needed in the future.  We also discussed that we still do not have a reason for the family history of breast cancer, and that she is considered high risk for breast cancer and therefore does quality for increased screening.

## 2018-11-14 ENCOUNTER — Encounter: Payer: Self-pay | Admitting: Cardiology

## 2018-11-14 ENCOUNTER — Other Ambulatory Visit: Payer: Self-pay

## 2018-11-14 ENCOUNTER — Ambulatory Visit (INDEPENDENT_AMBULATORY_CARE_PROVIDER_SITE_OTHER): Payer: BC Managed Care – PPO | Admitting: Cardiology

## 2018-11-14 VITALS — Ht 67.0 in | Wt 132.0 lb

## 2018-11-14 DIAGNOSIS — Z8249 Family history of ischemic heart disease and other diseases of the circulatory system: Secondary | ICD-10-CM | POA: Diagnosis not present

## 2018-11-14 DIAGNOSIS — R0789 Other chest pain: Secondary | ICD-10-CM | POA: Diagnosis not present

## 2018-11-14 DIAGNOSIS — I493 Ventricular premature depolarization: Secondary | ICD-10-CM

## 2018-11-14 DIAGNOSIS — R0602 Shortness of breath: Secondary | ICD-10-CM

## 2018-11-14 DIAGNOSIS — Z720 Tobacco use: Secondary | ICD-10-CM

## 2018-11-14 MED ORDER — DILTIAZEM HCL ER COATED BEADS 120 MG PO CP24: 120.0000 mg | ORAL_CAPSULE | Freq: Every day | ORAL | 1 refills | Status: DC

## 2018-11-14 NOTE — Progress Notes (Signed)
Primary Physician:  Briscoe Deutscher, DO   Patient ID: Terri Wilson, female    DOB: 10/12/1985, 33 y.o.   MRN: [id number]  Subjective:    Chief Complaint  Patient presents with  . Chest Pain  . New Patient (Initial Visit)  . Shortness of Breath  . Dizziness    HPI: Terri Wilson  is a 33 y.o. female  with history of gestational diabetes, frequent PVC's, anxiety/depression, migraines, tobacco use, last seen by [place] in 2017 for atypical chest pain and palpitations, referred back to [place] for palpitations.   No history of hypertension, hyperlipidemia, thyroid disorders. Previously palpitations felt to be related to adderall dosage. Had normal treadmill stress test and echocardiogram in 2017. She was started on low dose Metoprolol succinate in 2017, but did not take it. She was seen in ER on 11/04/2018 for evaluation of shortness of breath "breathlessness" for 2-3 weeks, progressively worsening.  She feels more winded.  States that she can barely read a book to her children, sweep floors, talk without being breathless.  Shortness of breath is worse with exertion but also when she is laying down to rest.  Has had associated extreme fatigue, exhaustion, "foggy brain", decreased appetite, intermittent nausea, feeling more cold than usual and intermittent night sweats.  She has lost 10 to 12 pounds in the last 3 to 4 months, unintentional normal but admits that she has been eating less because she has no appetite.  Had normal TSH, CXR, no evidence of ischemia by EKG or elevated troponin, recommended outpatient follow up.   She has continued to have symptoms since being seen in ER. Symptoms never resolved since in 2017, but have progressively worsened. Continues to have occasional chest heaviness, shortness of breath with exertion and just with talking, and palpitations/heart racing intermittently. States that "something is just not right".  She does have 2 small children (5 and 1). She is smoking  5-10 cigarettes per day. She has had lung function testing 3 years ago. She does report family history of heart disease on both maternal and paternal sides of her family in the ages of 60-40.    Past Medical History:  Diagnosis Date  . Allergic rhinitis   . Anxiety and depression   . Chiari malformation    7 mm  . Depression   . Family history of breast cancer   . Family history of gene mutation   . Family history of melanoma   . Family history of prostate cancer   . Gestational diabetes   . Herpes labialis   . Migraine   . Panic attack     Past Surgical History:  Procedure Laterality Date  . TONSILLECTOMY AND ADENOIDECTOMY  2001  . WISDOM TOOTH EXTRACTION      Social History   Socioeconomic History  . Marital status: Married    Spouse name: [name]  . Number of children: 2  . Years of education: 95  . Highest education level: Not on file  Occupational History  . Occupation: Administrator: [employer]    Comment: [employer]  Social Needs  . Financial resource strain: Not on file  . Food insecurity    Worry: Not on file    Inability: Not on file  . Transportation needs    Medical: Not on file    Non-medical: Not on file  Tobacco Use  . Smoking status: Current Every Day Smoker    Packs/day: 0.50    Years:  10.00    Pack years: 5.00    Types: Cigarettes  . Smokeless tobacco: Former Network engineer and Sexual Activity  . Alcohol use: Yes    Alcohol/week: 0.0 standard drinks    Comment: occ  . Drug use: No  . Sexual activity: Yes    Partners: Male  Lifestyle  . Physical activity    Days per week: Not on file    Minutes per session: Not on file  . Stress: Not on file  Relationships  . Social Herbalist on phone: Not on file    Gets together: Not on file    Attends religious service: Not on file    Active member of club or organization: Not on file    Attends meetings of clubs or organizations: Not on file    Relationship status: Not  on file  . Intimate partner violence    Fear of current or ex partner: Not on file    Emotionally abused: Not on file    Physically abused: Not on file    Forced sexual activity: Not on file  Other Topics Concern  . Not on file  Social History Narrative   Lives with husband. Caffeine Use: 40 mg/day.    Review of Systems  Constitution: Positive for malaise/fatigue and weight loss. Negative for decreased appetite and weight gain.  Eyes: Negative for visual disturbance.  Cardiovascular: Positive for chest pain, dyspnea on exertion and palpitations. Negative for claudication, leg swelling, orthopnea and syncope.  Respiratory: Positive for shortness of breath. Negative for hemoptysis and wheezing.   Endocrine: Negative for cold intolerance and heat intolerance.  Hematologic/Lymphatic: Does not bruise/bleed easily.  Skin: Negative for nail changes.  Musculoskeletal: Negative for muscle weakness and myalgias.  Gastrointestinal: Negative for abdominal pain, change in bowel habit, nausea and vomiting.  Neurological: Negative for difficulty with concentration, dizziness, focal weakness and headaches.  Psychiatric/Behavioral: Negative for altered mental status and suicidal ideas.  All other systems reviewed and are negative.     Objective:  Height '5\' 7"'$  (1.702 m), weight 132 lb (59.9 kg), last menstrual period 10/29/2018, SpO2 100 %, not currently breastfeeding. Body mass index is 20.67 kg/m.   BP supine: 106/74 BP sitting: 103/76 BP standing: 117/83     Physical Exam  Constitutional: She is oriented to person, place, and time. Vital signs are normal. She appears well-developed and well-nourished.  HENT:  Head: Normocephalic and atraumatic.  Neck: Normal range of motion.  Cardiovascular: Normal rate, regular rhythm, normal heart sounds and intact distal pulses.  Occasional extrasystoles are present.  Pulmonary/Chest: Effort normal and breath sounds normal. No accessory muscle usage. No  respiratory distress.  Abdominal: Soft. Bowel sounds are normal.  Musculoskeletal: Normal range of motion.  Neurological: She is alert and oriented to person, place, and time.  Skin: Skin is warm and dry.  Vitals reviewed.  Radiology: No results found.  Laboratory examination:    CMP Latest Ref Rng & Units 11/04/2018 06/24/2018 06/01/2018  Glucose 70 - 99 mg/dL 113(H) 87 116(H)  BUN 6 - 20 mg/dL '13 14 16  '$ Creatinine 0.44 - 1.00 mg/dL 0.66 0.68 0.72  Sodium 135 - 145 mmol/L 140 141 138  Potassium 3.5 - 5.1 mmol/L 3.8 4.1 3.7  Chloride 98 - 111 mmol/L 105 106 102  CO2 22 - 32 mmol/L '25 29 27  '$ Calcium 8.9 - 10.3 mg/dL 9.4 9.3 9.0  Total Protein 6.0 - 8.3 g/dL - 6.4 -  Total Bilirubin  0.2 - 1.2 mg/dL - 0.5 -  Alkaline Phos 39 - 117 U/L - 67 -  AST 0 - 37 U/L - 11 -  ALT 0 - 35 U/L - 11 -   CBC Latest Ref Rng & Units 11/04/2018 06/24/2018 06/01/2018  WBC 4.0 - 10.5 K/uL 7.4 5.6 6.3  Hemoglobin 12.0 - 15.0 g/dL 14.5 13.5 14.5  Hematocrit 36.0 - 46.0 % 43.0 39.7 43.9  Platelets 150 - 400 K/uL 230 203.0 196   Lipid Panel     Component Value Date/Time   CHOL 180 06/24/2018 1027   TRIG 71.0 06/24/2018 1027   HDL 57.10 06/24/2018 1027   CHOLHDL 3 06/24/2018 1027   VLDL 14.2 06/24/2018 1027   LDLCALC 109 (H) 06/24/2018 1027   HEMOGLOBIN A1C Lab Results  Component Value Date   HGBA1C 5.4 06/24/2018   TSH Recent Labs    06/01/18 1733 06/24/18 1027 11/04/18 2004  TSH 2.177 1.47 1.641    PRN Meds:. Medications Discontinued During This Encounter  Medication Reason  . ibuprofen (ADVIL,MOTRIN) 600 MG tablet Error   Current Meds  Medication Sig  . ALPRAZolam (XANAX) 0.5 MG tablet Take 1 tablet (0.5 mg total) by mouth 2 (two) times daily as needed for anxiety.  . citalopram (CELEXA) 40 MG tablet Take 1 tablet (40 mg total) by mouth daily. (Patient taking differently: Take 40 mg by mouth at bedtime. )  . ibuprofen (ADVIL) 200 MG tablet Take 800 mg by mouth every 6 (six) hours as  needed for mild pain.  . Multiple Vitamins-Minerals (MULTIVITAMIN WOMEN) TABS Take 1 tablet by mouth daily.    Cardiac Studies:   48 hours Holter-08/16/2015- frequent PVCs, occasional couplets, rare PACs.   Echocardiogram 08/25/2015: Left ventricle cavity is normal in size. Normal global wall motion. Normal diastolic filling pattern. Calculated EF 56%. Trace mitral regurgitation. Trace tricuspid regurgitation. Unable to estimate PA pressure due to absence/minimal TR signal. Trace pulmonic regurgitation. Essentially normal echo. No change from 12/09/2014.  Treadmill exercise stress test 08/26/2015: Indication: Chest pain The patient exercised according to [facility] Protocol, Total time recorded 11:00 min achieving max heart rate of 182 which was 94% of MPHR for age and 13.34 METS of work. Normal BP response. Resting ECG showing a NSR. There was no ST-T changes of ischemia with exercise stress test. No arrythmias noted during exercise. Stress terminated due to shortness of breath and THR >85% [facility] met. Excellent effort.  Assessment:     ICD-10-CM   1. Shortness of breath  R06.02 CT CORONARY MORPH W/CTA COR W/SCORE W/CA W/CM &/OR WO/CM    PCV ECHOCARDIOGRAM COMPLETE  2. Atypical chest pain  R07.89 EKG 12-Lead    CT CORONARY MORPH W/CTA COR W/SCORE W/CA W/CM &/OR WO/CM  3. Family history of heart disease  Z82.49 CT CORONARY MORPH W/CTA COR W/SCORE W/CA W/CM &/OR WO/CM  4. PVC (premature ventricular contraction)  I49.3 PCV ECHOCARDIOGRAM COMPLETE  5. Tobacco use  Z72.0     EKG 11/14/2018: Normal sinus rhythm at 82 bpm, 2 PVC's, IRBBB, no evidence of ischemia.    Recommendations:   Patient presents for evaluation of progressively worsening shortness of breath, chest pain, palpitations, and dizziness. She is noted to have occasional PVC on EKG today and has history of this on previous event monitor, suspect that her symptoms are related to this. She is noted to have shortness of breath  even with speaking on exam today. Physical exam is unremarkable. She has previously had negative treadmill stress  test with excellent exercise capacity. She does have family history of early CAD. Will schedule for coronary CTA for further evaluation. Will also obtain echocardiogram for evaluation of any structural abnormalities given her worsening shortness of breath and PVC's. If cardiac workup is unyielding, would consider pulmonary evaluation as I do suspect that her smoking may be contributing to her symptoms as well. She has previously been able to quit during pregnancy and due to her psychiatric history, medications are not good options. I have encouraged her to work to quit to further decrease her risk factors.   Patient did not tolerate low dose beta blocker in the past due to hypotension. She does have borderline soft blood pressure today, no orthostatic hypotension. In view of her PVC's, will start Diltiazem 120 mg daily. I have encouraged her to stay well hydrated to help with preventing hypotension and if she does have worsening dizziness with the medication to try taking every other day. I will see her back after the test for further recommendations and reevaluation.    *I have discussed this case with Dr. Virgina Jock and he personally examined the patient and participated in formulating the plan.*    Miquel Dunn, MSN, APRN, FNP-C [facility]. [place] Office: [phone] Fax: [phone]

## 2018-11-17 ENCOUNTER — Encounter: Payer: Self-pay | Admitting: Cardiology

## 2018-11-19 ENCOUNTER — Other Ambulatory Visit: Payer: Self-pay

## 2018-11-19 ENCOUNTER — Ambulatory Visit (INDEPENDENT_AMBULATORY_CARE_PROVIDER_SITE_OTHER): Payer: BC Managed Care – PPO | Admitting: Family Medicine

## 2018-11-19 ENCOUNTER — Encounter: Payer: Self-pay | Admitting: Family Medicine

## 2018-11-19 VITALS — BP 90/60 | HR 100 | Ht 67.0 in | Wt 128.0 lb

## 2018-11-19 DIAGNOSIS — R5383 Other fatigue: Secondary | ICD-10-CM

## 2018-11-19 MED ORDER — BUPROPION HCL 75 MG PO TABS: 75.0000 mg | ORAL_TABLET | Freq: Two times a day (BID) | ORAL | 1 refills | Status: DC

## 2018-11-19 NOTE — Progress Notes (Signed)
Virtual Visit via Video   Due to the COVID-19 pandemic, this visit was completed with telemedicine (audio/video) technology to reduce patient and provider exposure as well as to preserve personal protective equipment.   I connected with Terri Wilson by a video enabled telemedicine application and verified that I am speaking with the correct person using two identifiers. Location patient: Home Location provider: [facility], Office Persons participating in the virtual visit: Azie, Mcconahy, DO   I discussed the limitations of evaluation and management by telemedicine and the availability of in person appointments. The patient expressed understanding and agreed to proceed.  Care Team   Patient Care Team: Briscoe Deutscher, DO as PCP - General (Family Medicine) Obgyn, Erling Conte as Consulting Physician  Subjective:   HPI: ED visit reviewed. 33 year old is here with shortness of breath for 3 weeks.  Exertional but also at rest.  Associated with exhaustion, fatigue, decreased appetite, feeling more cold, weight loss.  She has had chest discomfort for several years, unchanged. H/o PVCs untreated.  Exam is benign. NSR. No signs of hypervolemia. No LE edema or calf tenderness. No respiratory distress. Appears slightly anxious.   Ddx includes anemia vs thyroid process. Chest discomfort is chronic, atypical, non pleuritic. Chronicity of symptoms, lack of pleuritic CP doubt PE. Wells score is 0, will add d-dimer.  No h/o HF, no signs of hypervolemia and HF exacerbation unlikely. No infectious symptoms to cause PNA, COVID. No exposures to COVID.   Work up vastly reassuring. D-dimer negative. Trop undetectable, her chest discomfort has been present for more than 3 hours and repeat not indicated. I doubt ACS. Hgb normal. T4 normal, pending TSH. CXR unremarkable. EKG without ischemia.    Pt given albuterol for comfort. Discussed work up so far. Etiology unclear but doubt life  threatening process. Will dc with supportive care and strict return precautions. She asked for albuterol for comfort, rx given. Encouraged PCP f/u for persistent symptoms.  ROS   Patient Active Problem List   Diagnosis Date Noted  . Genetic testing 11/12/2018  . Family history of breast cancer   . Family history of prostate cancer   . Family history of melanoma   . Family history of gene mutation   . GAD (generalized anxiety disorder) 09/04/2018  . Primary insomnia 09/04/2018  . Recurrent major depressive disorder, in partial remission ([place]) 09/04/2018  . Malaise and fatigue 07/20/2018  . Severe recurrent major depression ([place]) 07/04/2018  . Family history of von Willebrand disease 06/24/2018  . History of gestational diabetes 06/24/2018  . PVC (premature ventricular contraction) 09/21/2015  . Pulmonary nodule 12/13/2014  . Cigarette smoker 12/04/2014  . ADD (attention deficit disorder) 03/17/2014  . Migraine 04/25/2012  . AR (allergic rhinitis) 04/25/2012  . Anxiety and depression   . Panic attack     Social History   Tobacco Use  . Smoking status: Current Every Day Smoker    Packs/day: 0.50    Years: 10.00    Pack years: 5.00    Types: Cigarettes  . Smokeless tobacco: Former Network engineer Use Topics  . Alcohol use: Yes    Alcohol/week: 0.0 standard drinks    Comment: occ    Current Outpatient Medications:  .  ALPRAZolam (XANAX) 0.5 MG tablet, Take 1 tablet (0.5 mg total) by mouth 2 (two) times daily as needed for anxiety., Disp: 60 tablet, Rfl: 0 .  citalopram (CELEXA) 40 MG tablet, Take 1 tablet (40 mg total) by mouth daily. (  Patient taking differently: Take 40 mg by mouth at bedtime. ), Disp: 90 tablet, Rfl: 2 .  ibuprofen (ADVIL) 200 MG tablet, Take 800 mg by mouth every 6 (six) hours as needed for mild pain., Disp: , Rfl:  .  Multiple Vitamins-Minerals (MULTIVITAMIN WOMEN) TABS, Take 1 tablet by mouth daily., Disp: , Rfl:  .  buPROPion (WELLBUTRIN) 75 MG tablet,  Take 1 tablet (75 mg total) by mouth 2 (two) times daily., Disp: 60 tablet, Rfl: 1  Allergies  Allergen Reactions  . Lamictal [Lamotrigine] Anaphylaxis  . Vancomycin Other (See Comments)    Feels hot    Objective:   VITALS: Per patient if applicable, see vitals. GENERAL: Alert, appears well and in no acute distress. HEENT: Atraumatic, conjunctiva clear, no obvious abnormalities on inspection of external nose and ears. NECK: Normal movements of the head and neck. CARDIOPULMONARY: No increased WOB. Speaking in clear sentences. I:E ratio WNL.  MS: Moves all visible extremities without noticeable abnormality. PSYCH: Pleasant and cooperative, well-groomed. Speech normal rate and rhythm. Affect is appropriate. Insight and judgement are appropriate. Attention is focused, linear, and appropriate.  NEURO: CN grossly intact. Oriented as arrived to appointment on time with no prompting. Moves both UE equally.  SKIN: No obvious lesions, wounds, erythema, or cyanosis noted on face or hands.  Depression screen [facility] 2/9 11/19/2018 06/24/2018 08/08/2016  Decreased Interest 0 1 3  Down, Depressed, Hopeless 0 1 2  PHQ - 2 Score 0 2 5  Altered sleeping 3 1 3   Tired, decreased energy 3 3 3   Change in appetite 3 3 3   Feeling bad or failure about yourself  3 1 3   Trouble concentrating 0 1 3  Moving slowly or fidgety/restless 0 3 3  Suicidal thoughts 0 1 (No Data)  PHQ-9 Score 12 15 23   Difficult doing work/chores Extremely dIfficult Extremely dIfficult -    Assessment and Plan:   Monay was seen today for hospitalization follow-up.  Diagnoses and all orders for this visit:  Other fatigue Comments: Symptoms improving. No longer breastfeeding, so okay to start Wellbutrin as previously discussed. See orders. Orders: -     SAR CoV2 Serology (COVID 19)AB(IGG)IA; Future -     POCT urine pregnancy; Future -     Iron, TIBC and Ferritin Panel; Future -     Vitamin B12; Future -     Thyroid Peroxidase  Antibodies (TPO) (REFL); Future -     buPROPion (WELLBUTRIN) 75 MG tablet; Take 1 tablet (75 mg total) by mouth 2 (two) times daily.    Marland Kitchen COVID-19 Education: The signs and symptoms of COVID-19 were discussed with the patient and how to seek care for testing if needed. The importance of social distancing was discussed today. . Reviewed expectations re: course of current medical issues. . Discussed self-management of symptoms. . Outlined signs and symptoms indicating need for more acute intervention. . Patient verbalized understanding and all questions were answered. Marland Kitchen Health Maintenance issues including appropriate healthy diet, exercise, and smoking avoidance were discussed with patient. . See orders for this visit as documented in the electronic medical record.  Briscoe Deutscher, DO  Records requested if needed. Time spent: 25 minutes, of which >50% was spent in obtaining information about her symptoms, reviewing her previous labs, evaluations, and treatments, counseling her about her condition (please see the discussed topics above), and developing a plan to further investigate it; she had a number of questions which I addressed.

## 2018-12-06 ENCOUNTER — Encounter: Payer: Self-pay | Admitting: Family Medicine

## 2018-12-09 ENCOUNTER — Other Ambulatory Visit: Payer: Self-pay | Admitting: Cardiology

## 2018-12-09 NOTE — Telephone Encounter (Signed)
She not taking it PCP took her off medication, ill refuse it.

## 2018-12-09 NOTE — Telephone Encounter (Signed)
Unsure if patient is taking this

## 2018-12-12 ENCOUNTER — Other Ambulatory Visit: Payer: Self-pay | Admitting: Family Medicine

## 2018-12-12 DIAGNOSIS — R5383 Other fatigue: Secondary | ICD-10-CM

## 2018-12-15 ENCOUNTER — Other Ambulatory Visit: Payer: BC Managed Care – PPO

## 2018-12-24 ENCOUNTER — Other Ambulatory Visit: Payer: Self-pay | Admitting: Family Medicine

## 2018-12-24 DIAGNOSIS — F410 Panic disorder [episodic paroxysmal anxiety] without agoraphobia: Secondary | ICD-10-CM

## 2018-12-25 ENCOUNTER — Ambulatory Visit: Payer: BC Managed Care – PPO | Admitting: Cardiology

## 2018-12-25 NOTE — Telephone Encounter (Signed)
Last OV: 11/19/18 Next OV: None scheduled at this time PMP website checked: last filled on 10/29/18

## 2018-12-31 ENCOUNTER — Encounter: Payer: Self-pay | Admitting: Family Medicine

## 2019-01-02 ENCOUNTER — Other Ambulatory Visit: Payer: Self-pay

## 2019-01-02 ENCOUNTER — Encounter: Payer: Self-pay | Admitting: Family Medicine

## 2019-01-02 ENCOUNTER — Ambulatory Visit (INDEPENDENT_AMBULATORY_CARE_PROVIDER_SITE_OTHER): Payer: BC Managed Care – PPO | Admitting: Family Medicine

## 2019-01-02 VITALS — Ht 67.0 in | Wt 125.0 lb

## 2019-01-02 DIAGNOSIS — F411 Generalized anxiety disorder: Secondary | ICD-10-CM

## 2019-01-02 DIAGNOSIS — F633 Trichotillomania: Secondary | ICD-10-CM

## 2019-01-02 DIAGNOSIS — F988 Other specified behavioral and emotional disorders with onset usually occurring in childhood and adolescence: Secondary | ICD-10-CM | POA: Diagnosis not present

## 2019-01-02 DIAGNOSIS — F17210 Nicotine dependence, cigarettes, uncomplicated: Secondary | ICD-10-CM | POA: Diagnosis not present

## 2019-01-02 DIAGNOSIS — F3341 Major depressive disorder, recurrent, in partial remission: Secondary | ICD-10-CM | POA: Diagnosis not present

## 2019-01-02 NOTE — Telephone Encounter (Signed)
Called patient app made.

## 2019-01-02 NOTE — Progress Notes (Signed)
Virtual Visit via Video   Due to the COVID-19 pandemic, this visit was completed with telemedicine (audio/video) technology to reduce patient and provider exposure as well as to preserve personal protective equipment.   I connected with Terri Wilson by a video enabled telemedicine application and verified that I am speaking with the correct person using two identifiers. Location patient: Home Location provider: [facility], Office Persons participating in the virtual visit: Terri Wilson, [place], DO Terri Wilson, CMA acting as scribe for Dr. Briscoe Wilson.   I discussed the limitations of evaluation and management by telemedicine and the availability of in person appointments. The patient expressed understanding and agreed to proceed.  Care Team   Patient Care Team: Terri Deutscher, DO as PCP - General (Family Medicine) Obgyn, Terri Wilson as Consulting Physician  Subjective:   HPI:   From My chart message:  It's been several weeks since I started taking Bupropion, and I feel like the side effects are outweighing the positive. The positive is that I've lost 10 lbs. The negative is, I'm jittery and anxious (which has made me feel like smoking more), I have very little appetite causing my energy to plummet, and headaches. I've also had a recurrence of pulling my hair out which I only did for the years I was on Adderall. I feel like we might try something else? Let me know if/when you want to see me  Review of Systems  Constitutional: Negative for chills and fever.  HENT: Negative for hearing loss and tinnitus.   Eyes: Negative for blurred vision and double vision.  Respiratory: Negative for cough and wheezing.   Cardiovascular: Negative for chest pain, palpitations and leg swelling.  Gastrointestinal: Negative for nausea and vomiting.  Genitourinary: Negative for dysuria and urgency.  Neurological: Negative for dizziness and headaches.  Psychiatric/Behavioral:  Negative for depression and suicidal ideas.    Patient Active Problem List   Diagnosis Date Noted  . Genetic testing 11/12/2018  . Family history of breast cancer   . Family history of prostate cancer   . Family history of melanoma   . GAD (generalized anxiety disorder) 09/04/2018  . Primary insomnia 09/04/2018  . Recurrent major depressive disorder, in partial remission (Terri Wilson) 09/04/2018  . Severe recurrent major depression (Terri Wilson) 07/04/2018  . Family history of von Willebrand disease 06/24/2018  . History of gestational diabetes 06/24/2018  . PVC (premature ventricular contraction) 09/21/2015  . Pulmonary nodule 12/13/2014  . Cigarette smoker 12/04/2014  . ADD (attention deficit disorder) 03/17/2014  . Migraine 04/25/2012  . AR (allergic rhinitis) 04/25/2012  . Anxiety and depression   . Panic attack     Social History   Tobacco Use  . Smoking status: Current Every Day Smoker    Packs/day: 0.50    Years: 10.00    Pack years: 5.00    Types: Cigarettes  . Smokeless tobacco: Former Network engineer Use Topics  . Alcohol use: Yes    Alcohol/week: 0.0 standard drinks    Comment: occ   Current Outpatient Medications:  .  ALPRAZolam (XANAX) 0.5 MG tablet, TAKE 1 TABLET (0.5 MG TOTAL) BY MOUTH 2 TIMES DAILY AS NEEDED FOR ANXIETY., Disp: 60 tablet, Rfl: 0 .  buPROPion (WELLBUTRIN) 75 MG tablet, TAKE 1 TABLET BY MOUTH TWICE A DAY, Disp: 180 tablet, Rfl: 1 .  citalopram (CELEXA) 40 MG tablet, Take 1 tablet (40 mg total) by mouth daily. (Patient taking differently: Take 40 mg by mouth at bedtime. ), Disp:  90 tablet, Rfl: 2 .  ibuprofen (ADVIL) 200 MG tablet, Take 800 mg by mouth every 6 (six) hours as needed for mild pain., Disp: , Rfl:  .  Multiple Vitamins-Minerals (MULTIVITAMIN WOMEN) TABS, Take 1 tablet by mouth daily., Disp: , Rfl:   Allergies  Allergen Reactions  . Lamictal [Lamotrigine] Anaphylaxis  . Wellbutrin [Bupropion] Other (See Comments)    Increased anxiety, weight  loss, OCD  . Vancomycin Other (See Comments)    Feels hot    Objective:   VITALS: Per patient if applicable, see vitals. GENERAL: Alert, appears well and in no acute distress. HEENT: Atraumatic, conjunctiva clear, no obvious abnormalities on inspection of external nose and ears. NECK: Normal movements of the head and neck. CARDIOPULMONARY: No increased WOB. Speaking in clear sentences. I:E ratio WNL.  MS: Moves all visible extremities without noticeable abnormality. PSYCH: Pleasant and cooperative, well-groomed. Speech normal rate and rhythm. Affect is appropriate. Insight and judgement are appropriate. Attention is focused, linear, and appropriate.  NEURO: CN grossly intact. Oriented as arrived to appointment on time with no prompting. Moves both UE equally.  SKIN: No obvious lesions, wounds, erythema, or cyanosis noted on face or hands.  Depression screen [facility] 2/9 01/02/2019 11/19/2018 06/24/2018  Decreased Interest 0 0 1  Down, Depressed, Hopeless 0 0 1  PHQ - 2 Score 0 0 2  Altered sleeping 0 3 1  Tired, decreased energy 3 3 3   Change in appetite 3 3 3   Feeling bad or failure about yourself  0 3 1  Trouble concentrating 0 0 1  Moving slowly or fidgety/restless 0 0 3  Suicidal thoughts 0 0 1  PHQ-9 Score 6 12 15   Difficult doing work/chores Extremely dIfficult Extremely dIfficult Extremely dIfficult    Assessment and Plan:   Terri Wilson was seen today for follow-up.  Diagnoses and all orders for this visit:  GAD (generalized anxiety disorder) Comments: Worsened with Wellbutrin.  Situationally, dealing with COVID, sister with breast cancer, son having trouble in his new school.  Attention deficit disorder, unspecified hyperactivity presence  Recurrent major depressive disorder, in partial remission (Terri Wilson) Comments: Depression is actually decreased with Celexa.  Issue is anxiety and OCD at the moment.  Cigarette smoker Comments: Up to a pack per day.  Feels more anxious on  Wellbutrin which is actually making her smoke more.  Trichotillomania Comments: Previously well controlled.  Happened in the past when on Adderall.  Will stop Wellbutrin.  May need to increase Celexa to 60 mg.    . COVID-19 Education: The signs and symptoms of COVID-19 were discussed with the patient and how to seek care for testing if needed. The importance of social distancing was discussed today. . Reviewed expectations re: course of current medical issues. . Discussed self-management of symptoms. . Outlined signs and symptoms indicating need for more acute intervention. . Patient verbalized understanding and all questions were answered. Marland Kitchen Health Maintenance issues including appropriate healthy diet, exercise, and smoking avoidance were discussed with patient. . See orders for this visit as documented in the electronic medical record.  Terri Deutscher, DO  Records requested if needed. Time spent: 25 minutes, of which >50% was spent in obtaining information about her symptoms, reviewing her previous labs, evaluations, and treatments, counseling her about her condition (please see the discussed topics above), and developing a plan to further investigate it; she had a number of questions which I addressed.

## 2019-01-29 DIAGNOSIS — Z3200 Encounter for pregnancy test, result unknown: Secondary | ICD-10-CM | POA: Diagnosis not present

## 2019-02-20 ENCOUNTER — Other Ambulatory Visit: Payer: Self-pay

## 2019-02-20 DIAGNOSIS — F3341 Major depressive disorder, recurrent, in partial remission: Secondary | ICD-10-CM

## 2019-02-20 DIAGNOSIS — F411 Generalized anxiety disorder: Secondary | ICD-10-CM

## 2019-03-07 DIAGNOSIS — Z20828 Contact with and (suspected) exposure to other viral communicable diseases: Secondary | ICD-10-CM | POA: Diagnosis not present

## 2019-07-10 ENCOUNTER — Ambulatory Visit (HOSPITAL_COMMUNITY)
Admission: EM | Admit: 2019-07-10 | Discharge: 2019-07-10 | Disposition: A | Discharge status: Home / Self Care | Payer: BC Managed Care – PPO | Attending: Emergency Medicine | Admitting: Emergency Medicine

## 2019-07-10 ENCOUNTER — Other Ambulatory Visit: Payer: Self-pay

## 2019-07-10 ENCOUNTER — Ambulatory Visit (INDEPENDENT_AMBULATORY_CARE_PROVIDER_SITE_OTHER): Payer: BC Managed Care – PPO

## 2019-07-10 ENCOUNTER — Encounter (HOSPITAL_COMMUNITY): Payer: Self-pay

## 2019-07-10 DIAGNOSIS — M545 Low back pain, unspecified: Secondary | ICD-10-CM

## 2019-07-10 DIAGNOSIS — S39012A Strain of muscle, fascia and tendon of lower back, initial encounter: Secondary | ICD-10-CM

## 2019-07-10 DIAGNOSIS — R509 Fever, unspecified: Secondary | ICD-10-CM | POA: Insufficient documentation

## 2019-07-10 DIAGNOSIS — R5383 Other fatigue: Secondary | ICD-10-CM | POA: Insufficient documentation

## 2019-07-10 DIAGNOSIS — Y999 Unspecified external cause status: Secondary | ICD-10-CM | POA: Insufficient documentation

## 2019-07-10 DIAGNOSIS — F329 Major depressive disorder, single episode, unspecified: Secondary | ICD-10-CM | POA: Insufficient documentation

## 2019-07-10 DIAGNOSIS — F17210 Nicotine dependence, cigarettes, uncomplicated: Secondary | ICD-10-CM | POA: Diagnosis not present

## 2019-07-10 DIAGNOSIS — F419 Anxiety disorder, unspecified: Secondary | ICD-10-CM | POA: Diagnosis not present

## 2019-07-10 DIAGNOSIS — Z791 Long term (current) use of non-steroidal anti-inflammatories (NSAID): Secondary | ICD-10-CM | POA: Insufficient documentation

## 2019-07-10 DIAGNOSIS — R300 Dysuria: Secondary | ICD-10-CM | POA: Insufficient documentation

## 2019-07-10 DIAGNOSIS — Y929 Unspecified place or not applicable: Secondary | ICD-10-CM | POA: Insufficient documentation

## 2019-07-10 DIAGNOSIS — Z20822 Contact with and (suspected) exposure to covid-19: Secondary | ICD-10-CM | POA: Diagnosis not present

## 2019-07-10 DIAGNOSIS — Z3202 Encounter for pregnancy test, result negative: Secondary | ICD-10-CM

## 2019-07-10 DIAGNOSIS — Z79899 Other long term (current) drug therapy: Secondary | ICD-10-CM | POA: Insufficient documentation

## 2019-07-10 DIAGNOSIS — R519 Headache, unspecified: Secondary | ICD-10-CM | POA: Insufficient documentation

## 2019-07-10 DIAGNOSIS — Y939 Activity, unspecified: Secondary | ICD-10-CM | POA: Insufficient documentation

## 2019-07-10 LAB — CBC WITH DIFFERENTIAL/PLATELET
Abs Immature Granulocytes: 0.02 10*3/uL (ref 0.00–0.07)
Basophils Absolute: 0 10*3/uL (ref 0.0–0.1)
Basophils Relative: 0 %
Eosinophils Absolute: 0.1 10*3/uL (ref 0.0–0.5)
Eosinophils Relative: 2 %
HCT: 41.2 % (ref 36.0–46.0)
Hemoglobin: 14 g/dL (ref 12.0–15.0)
Immature Granulocytes: 0 %
Lymphocytes Relative: 31 %
Lymphs Abs: 1.9 10*3/uL (ref 0.7–4.0)
MCH: 29.9 pg (ref 26.0–34.0)
MCHC: 34 g/dL (ref 30.0–36.0)
MCV: 88 fL (ref 80.0–100.0)
Monocytes Absolute: 0.7 10*3/uL (ref 0.1–1.0)
Monocytes Relative: 12 %
Neutro Abs: 3.4 10*3/uL (ref 1.7–7.7)
Neutrophils Relative %: 55 %
Platelets: 205 10*3/uL (ref 150–400)
RBC: 4.68 MIL/uL (ref 3.87–5.11)
RDW: 11.9 % (ref 11.5–15.5)
WBC: 6.2 10*3/uL (ref 4.0–10.5)
nRBC: 0 % (ref 0.0–0.2)

## 2019-07-10 LAB — POCT URINALYSIS DIP (DEVICE)
Bilirubin Urine: NEGATIVE
Glucose, UA: NEGATIVE mg/dL
Ketones, ur: NEGATIVE mg/dL
Nitrite: NEGATIVE
Protein, ur: NEGATIVE mg/dL
Specific Gravity, Urine: >=1.03 (ref 1.005–1.030)
Urobilinogen, UA: 0.2 mg/dL (ref 0.0–1.0)
pH: 6 (ref 5.0–8.0)

## 2019-07-10 LAB — COMPREHENSIVE METABOLIC PANEL
ALT: 10 U/L (ref 0–44)
AST: 16 U/L (ref 15–41)
Albumin: 4.3 g/dL (ref 3.5–5.0)
Alkaline Phosphatase: 56 U/L (ref 38–126)
Anion gap: 9 (ref 5–15)
BUN: 16 mg/dL (ref 6–20)
CO2: 25 mmol/L (ref 22–32)
Calcium: 9.3 mg/dL (ref 8.9–10.3)
Chloride: 105 mmol/L (ref 98–111)
Creatinine, Ser: 0.78 mg/dL (ref 0.44–1.00)
GFR calc Af Amer: >60 mL/min (ref >60)
GFR calc non Af Amer: >60 mL/min (ref >60)
Glucose, Bld: 110 mg/dL — ABNORMAL HIGH (ref 70–99)
Potassium: 4.3 mmol/L (ref 3.5–5.1)
Sodium: 139 mmol/L (ref 135–145)
Total Bilirubin: 0.4 mg/dL (ref 0.3–1.2)
Total Protein: 6.9 g/dL (ref 6.5–8.1)

## 2019-07-10 LAB — POC URINE PREG, ED
Preg Test, Ur: NEGATIVE
Preg Test, Ur: NEGATIVE

## 2019-07-10 LAB — POCT PREGNANCY, URINE: Preg Test, Ur: NEGATIVE

## 2019-07-10 MED ORDER — NITROFURANTOIN MONOHYD MACRO 100 MG PO CAPS: 100.0000 mg | ORAL_CAPSULE | Freq: Two times a day (BID) | ORAL | 0 refills | Status: AC

## 2019-07-10 MED ORDER — NAPROXEN 500 MG PO TABS: 500.0000 mg | ORAL_TABLET | Freq: Two times a day (BID) | ORAL | 0 refills | Status: DC

## 2019-07-10 MED ORDER — KETOROLAC TROMETHAMINE 30 MG/ML IJ SOLN
30.0000 mg | Freq: Once | INTRAMUSCULAR | Status: AC
  Administered 2019-07-10: 30 mg via INTRAMUSCULAR

## 2019-07-10 MED ORDER — CYCLOBENZAPRINE HCL 10 MG PO TABS: 10.0000 mg | ORAL_TABLET | Freq: Two times a day (BID) | ORAL | 0 refills | Status: DC | PRN

## 2019-07-10 MED ORDER — KETOROLAC TROMETHAMINE 30 MG/ML IJ SOLN
INTRAMUSCULAR | Status: AC
  Filled 2019-07-10: qty 1

## 2019-07-10 NOTE — ED Provider Notes (Signed)
[place]    CSN: [id number] Arrival date & time: 07/10/19  1757      History   Chief Complaint Chief Complaint  Patient presents with  . Back Pain  . Abdominal Pain  . Fever    HPI Terri Wilson is a 34 y.o. female.   Patient reports lower left back pain for the last 2 weeks. Reports that she has not really tried to treat at home. Reports that she took ibuprofen today with little relief. Reports that she is fatigued, had a fever of 101 earlier today, complaints of the back pain radiating around to her left lower abdomen. Reports that the abdominal pain just started today. Reports headaches, body aches. Denies sore throat, nausea, vomiting, diarrhea, rash or other symptoms.  ROS per HPI  The history is provided by the patient.    Past Medical History:  Diagnosis Date  . Allergic rhinitis   . Anxiety and depression   . Chiari malformation    7 mm  . Depression   . Family history of breast cancer   . Family history of gene mutation   . Family history of melanoma   . Family history of prostate cancer   . Gestational diabetes   . Herpes labialis   . Migraine   . Panic attack     Patient Active Problem List   Diagnosis Date Noted  . Genetic testing 11/12/2018  . Family history of breast cancer   . Family history of prostate cancer   . Family history of melanoma   . GAD (generalized anxiety disorder) 09/04/2018  . Primary insomnia 09/04/2018  . Recurrent major depressive disorder, in partial remission ([place]) 09/04/2018  . Severe recurrent major depression ([place]) 07/04/2018  . Family history of von Willebrand disease 06/24/2018  . History of gestational diabetes 06/24/2018  . PVC (premature ventricular contraction) 09/21/2015  . Pulmonary nodule 12/13/2014  . Cigarette smoker 12/04/2014  . ADD (attention deficit disorder) 03/17/2014  . Migraine 04/25/2012  . AR (allergic rhinitis) 04/25/2012  . Anxiety and depression   . Panic attack     Past  Surgical History:  Procedure Laterality Date  . TONSILLECTOMY AND ADENOIDECTOMY  2001  . WISDOM TOOTH EXTRACTION      OB History    Gravida  2   Para  2   Term  2   Preterm  0   AB  0   Living  2     SAB  0   TAB  0   Ectopic  0   Multiple  0   Live Births  2            Home Medications    Prior to Admission medications   Medication Sig Start Date End Date Taking? Authorizing Provider  ALPRAZolam (XANAX) 0.5 MG tablet TAKE 1 TABLET (0.5 MG TOTAL) BY MOUTH 2 TIMES DAILY AS NEEDED FOR ANXIETY. 12/26/18   Briscoe Deutscher, DO  citalopram (CELEXA) 40 MG tablet Take 1 tablet (40 mg total) by mouth daily. Patient taking differently: Take 40 mg by mouth at bedtime.  09/04/18   Briscoe Deutscher, DO  cyclobenzaprine (FLEXERIL) 10 MG tablet Take 1 tablet (10 mg total) by mouth 2 (two) times daily as needed for muscle spasms. 07/10/19   Faustino Congress, NP  ibuprofen (ADVIL) 200 MG tablet Take 800 mg by mouth every 6 (six) hours as needed for mild pain.    [provider]  Multiple Vitamins-Minerals (MULTIVITAMIN WOMEN) TABS  Take 1 tablet by mouth daily.    [provider]  naproxen (NAPROSYN) 500 MG tablet Take 1 tablet (500 mg total) by mouth 2 (two) times daily. 07/10/19   Faustino Congress, NP  nitrofurantoin, macrocrystal-monohydrate, (MACROBID) 100 MG capsule Take 1 capsule (100 mg total) by mouth 2 (two) times daily for 5 days. 07/10/19 07/15/19  Faustino Congress, NP    Family History Family History  Problem Relation Age of Onset  . Asthma Mother   . Heart disease Father 31  . Alcohol abuse Father   . Depression Father   . Heart attack Father   . Von Willebrand disease Sister   . Cancer Sister   . Breast cancer Maternal Grandmother   . Colon cancer Maternal Grandfather   . Hyperlipidemia Other   . Hypertension Other   . Obesity Other   . Heart disease Other   . Heart disease Paternal Aunt   . Cancer Paternal Grandfather     Social  History Social History   Tobacco Use  . Smoking status: Current Every Day Smoker    Packs/day: 0.50    Years: 10.00    Pack years: 5.00    Types: Cigarettes  . Smokeless tobacco: Former Network engineer Use Topics  . Alcohol use: Yes    Alcohol/week: 0.0 standard drinks    Comment: occ  . Drug use: No     Allergies   Lamictal [lamotrigine], Wellbutrin [bupropion], and Vancomycin   Review of Systems Review of Systems   Physical Exam Triage Vital Signs ED Triage Vitals  Enc Vitals Group     BP 07/10/19 1830 110/80     Pulse Rate 07/10/19 1830 (!) 109     Resp 07/10/19 1830 20     Temp 07/10/19 1830 98.3 F (36.8 C)     Temp Source 07/10/19 1830 Oral     SpO2 07/10/19 1830 97 %     Weight --      Height --      Head Circumference --      Peak Flow --      Pain Score 07/10/19 1827 8     Pain Loc --      Pain Edu? --      Excl. in [place]? --    No data found.  Updated Vital Signs BP 110/80 (BP Location: Right Arm)   Pulse (!) 109   Temp 98.3 F (36.8 C) (Oral)   Resp 20   LMP 07/01/2019 (Exact Date)   SpO2 97%      Physical Exam Vitals and nursing note reviewed.  Constitutional:      General: She is not in acute distress.    Appearance: She is well-developed. She is ill-appearing.  HENT:     Head: Normocephalic and atraumatic.  Eyes:     Conjunctiva/sclera: Conjunctivae normal.  Cardiovascular:     Rate and Rhythm: Normal rate and regular rhythm.     Heart sounds: No murmur.  Pulmonary:     Effort: Pulmonary effort is normal. No respiratory distress.     Breath sounds: Normal breath sounds. No stridor. No wheezing, rhonchi or rales.  Chest:     Chest wall: No tenderness.  Abdominal:     General: Abdomen is flat. Bowel sounds are normal.     Palpations: Abdomen is soft.     Tenderness: There is abdominal tenderness in the left lower quadrant. Negative signs include Murphy's sign, Rovsing's sign, McBurney's sign and psoas sign.  Musculoskeletal:      Cervical back: Neck supple.  Skin:    General: Skin is warm and dry.     Capillary Refill: Capillary refill takes less than 2 seconds.  Neurological:     General: No focal deficit present.     Mental Status: She is alert.  Psychiatric:        Mood and Affect: Mood normal.        Behavior: Behavior normal.      UC Treatments / Results  Labs (all labs ordered are listed, but only abnormal results are displayed) Labs Reviewed  POCT URINALYSIS DIP (DEVICE) - Abnormal; Notable for the following components:      Result Value   Hgb urine dipstick TRACE (*)    Leukocytes,Ua TRACE (*)    All other components within normal limits  URINE CULTURE  CBC WITH DIFFERENTIAL/PLATELET  COMPREHENSIVE METABOLIC PANEL  POCT PREGNANCY, URINE  POC URINE PREG, ED  POC URINE PREG, ED    EKG   Radiology DG Lumbar Spine Complete  Result Date: 07/10/2019 CLINICAL DATA:  34 year old female with low back pain. EXAM: LUMBAR SPINE - COMPLETE 4+ VIEW COMPARISON:  Lumbar spine radiograph dated 07/23/2014. FINDINGS: Five lumbar type vertebral. There is no acute fracture or subluxation of the lumbar spine. The vertebral body heights and disc spaces are maintained. The visualized posterior elements are intact. There is mild levoscoliosis. An IUD is noted over the pelvis. The soft tissues are unremarkable. IMPRESSION: 1. No acute fracture or subluxation. 2. Mild lumbar levoscoliosis. Electronically Signed   By: Anner Crete M.D.   On: 07/10/2019 19:28    Procedures Procedures (including critical care time)  Medications Ordered in UC Medications  ketorolac (TORADOL) 30 MG/ML injection 30 mg (30 mg Intramuscular Given 07/10/19 1914)    Initial Impression / Assessment and Plan / UC Course  I have reviewed the triage vital signs and the nursing notes.  Pertinent labs & imaging results that were available during my care of the patient were reviewed by me and considered in my medical decision making (see  chart for details).     Patient is ill-appearing, and upon walking into the room she is shifting her weight from side to side, will not sit down. She appears very uncomfortable.  X-ray negative in office today.  Ordered 30 mg Toradol IM injection in office today. UA today significant for trace amounts of blood and leuks. Will culture and inform patient of results. Will treat today with Macrobid 100 mg twice daily x7 days since she is symptomatic in the office today. Instructed on how to take her medication. I have also sent naproxen and Flexeril to her pharmacy. Instructed not to drive or operate heavy machinery while taking Flexeril. Instructed to follow-up with primary care physician as needed. Instructed on when to go to the ED. Final Clinical Impressions(s) / UC Diagnoses   Final diagnoses:  Strain of lumbar region, initial encounter  Acute left-sided low back pain, unspecified whether sciatica present  Fever, unspecified  Fatigue, unspecified type  Dysuria     Discharge Instructions     Your urinalysis in the office today showed trace amounts of blood and white blood cells in your urine. We will culture your urine, and let you know what results of these are as soon as we have that back.  I have gone ahead and sent an antibiotic into your pharmacy since you are symptomatic today.  You also have a pulled muscle in your  back. This could be attributing to some of the pain. You received a Toradol injection in office. Do not take ibuprofen anymore tonight.  Follow-up with primary care as needed.  Your x-ray was negative in office today.  Follow-up at the ER if you are having shortness of breath, unrelenting pain, high fever cough or other concerning symptoms.     ED Prescriptions    Medication Sig Dispense Auth. Provider   cyclobenzaprine (FLEXERIL) 10 MG tablet Take 1 tablet (10 mg total) by mouth 2 (two) times daily as needed for muscle spasms. 20 tablet Faustino Congress, NP    naproxen (NAPROSYN) 500 MG tablet Take 1 tablet (500 mg total) by mouth 2 (two) times daily. 30 tablet Faustino Congress, NP   nitrofurantoin, macrocrystal-monohydrate, (MACROBID) 100 MG capsule Take 1 capsule (100 mg total) by mouth 2 (two) times daily for 5 days. 10 capsule Faustino Congress, NP     I have reviewed the PDMP during this encounter.   Faustino Congress, NP 07/10/19 1935

## 2019-07-10 NOTE — Discharge Instructions (Addendum)
Your urinalysis in the office today showed trace amounts of blood and white blood cells in your urine. We will culture your urine, and let you know what results of these are as soon as we have that back.  I have gone ahead and sent an antibiotic into your pharmacy since you are symptomatic today.  You also have a pulled muscle in your back. This could be attributing to some of the pain. You received a Toradol injection in office. Do not take ibuprofen anymore tonight.  Follow-up with primary care as needed.  Your x-ray was negative in office today.  Follow-up at the ER if you are having shortness of breath, unrelenting pain, high fever cough or other concerning symptoms.

## 2019-07-10 NOTE — ED Triage Notes (Addendum)
Pt reports having back pain x 3 weeks, worsened in the past 3 days. Back pain radiates to abdomen. Pt reports pain is worse when she walks or  bend over. Pt reports having numbness, tingling and pain in her right leg. Pt reports she had fever today 101.0 F.

## 2019-07-11 ENCOUNTER — Telehealth (HOSPITAL_COMMUNITY): Payer: Self-pay | Admitting: *Deleted

## 2019-07-11 LAB — URINE CULTURE

## 2019-07-11 NOTE — Telephone Encounter (Signed)
Pt states she received msg from [facility] stating the urine culture sample needs to be recollected due to multiple species being present in sample.  Pt continues to state that she is feeling worse.  Continues with fever, diarrhea, severe back pain, "I have a headache and can barely open my eyes".  Reminded pt per Kirkland Hun instructions earlier today that if she is feeling worse, she needs to go to ED.  Pt verbalized understanding.

## 2019-07-11 NOTE — Telephone Encounter (Signed)
Returned Terri Wilson phone call.  Terri Wilson states she took one dose of Macrobid last night, now today she's having "constant diarrhea", along with abd cramping and HA, and the back pain with fever of 101 continues.  Denies any n/v.  Discussed with Kirkland Hun, NP: states to offer to have Terri Wilson D/C the Macrobid until her urine cx is back, recommend probiotic, and if she feels the diarrhea is severe enough that it's causing her to be dehydrated, then she needs to go to ED.  Verbalized all above recommendations to Terri Wilson, Terri Wilson verbalized understanding.  Reiterated to Terri Wilson that she should go to ED for any worsening pain, inability to keep PO fluids down, constant diarrhea with dehydration, lightheadedness, dizziness, or any other concerns.  Terri Wilson verbalized understanding.

## 2019-07-13 ENCOUNTER — Encounter: Payer: Self-pay | Admitting: Physician Assistant

## 2019-07-13 ENCOUNTER — Emergency Department (HOSPITAL_BASED_OUTPATIENT_CLINIC_OR_DEPARTMENT_OTHER): Payer: BC Managed Care – PPO

## 2019-07-13 ENCOUNTER — Encounter (HOSPITAL_BASED_OUTPATIENT_CLINIC_OR_DEPARTMENT_OTHER): Payer: Self-pay | Admitting: *Deleted

## 2019-07-13 ENCOUNTER — Emergency Department (HOSPITAL_BASED_OUTPATIENT_CLINIC_OR_DEPARTMENT_OTHER)
Admission: EM | Admit: 2019-07-13 | Discharge: 2019-07-13 | Disposition: A | Discharge status: Home / Self Care | Payer: BC Managed Care – PPO | Attending: Emergency Medicine | Admitting: Emergency Medicine

## 2019-07-13 ENCOUNTER — Ambulatory Visit (INDEPENDENT_AMBULATORY_CARE_PROVIDER_SITE_OTHER): Payer: BC Managed Care – PPO | Admitting: Physician Assistant

## 2019-07-13 ENCOUNTER — Other Ambulatory Visit: Payer: Self-pay

## 2019-07-13 VITALS — BP 100/70 | HR 106 | Temp 98.0°F | Ht 67.0 in | Wt 138.4 lb

## 2019-07-13 DIAGNOSIS — Z87891 Personal history of nicotine dependence: Secondary | ICD-10-CM | POA: Insufficient documentation

## 2019-07-13 DIAGNOSIS — Z888 Allergy status to other drugs, medicaments and biological substances status: Secondary | ICD-10-CM | POA: Diagnosis not present

## 2019-07-13 DIAGNOSIS — M5441 Lumbago with sciatica, right side: Secondary | ICD-10-CM | POA: Insufficient documentation

## 2019-07-13 DIAGNOSIS — M5440 Lumbago with sciatica, unspecified side: Secondary | ICD-10-CM

## 2019-07-13 DIAGNOSIS — Z881 Allergy status to other antibiotic agents status: Secondary | ICD-10-CM | POA: Diagnosis not present

## 2019-07-13 DIAGNOSIS — M545 Low back pain, unspecified: Secondary | ICD-10-CM

## 2019-07-13 DIAGNOSIS — N390 Urinary tract infection, site not specified: Secondary | ICD-10-CM | POA: Diagnosis not present

## 2019-07-13 DIAGNOSIS — M5127 Other intervertebral disc displacement, lumbosacral region: Secondary | ICD-10-CM | POA: Diagnosis not present

## 2019-07-13 DIAGNOSIS — R1084 Generalized abdominal pain: Secondary | ICD-10-CM | POA: Diagnosis not present

## 2019-07-13 DIAGNOSIS — R1032 Left lower quadrant pain: Secondary | ICD-10-CM | POA: Diagnosis not present

## 2019-07-13 DIAGNOSIS — M5442 Lumbago with sciatica, left side: Secondary | ICD-10-CM | POA: Diagnosis not present

## 2019-07-13 DIAGNOSIS — Z79899 Other long term (current) drug therapy: Secondary | ICD-10-CM | POA: Diagnosis not present

## 2019-07-13 DIAGNOSIS — M5126 Other intervertebral disc displacement, lumbar region: Secondary | ICD-10-CM | POA: Diagnosis not present

## 2019-07-13 DIAGNOSIS — R509 Fever, unspecified: Secondary | ICD-10-CM | POA: Diagnosis not present

## 2019-07-13 DIAGNOSIS — M549 Dorsalgia, unspecified: Secondary | ICD-10-CM | POA: Diagnosis not present

## 2019-07-13 LAB — CBC WITH DIFFERENTIAL/PLATELET
Abs Immature Granulocytes: 0.01 10*3/uL (ref 0.00–0.07)
Basophils Absolute: 0 10*3/uL (ref 0.0–0.1)
Basophils Relative: 1 %
Eosinophils Absolute: 0.1 10*3/uL (ref 0.0–0.5)
Eosinophils Relative: 2 %
HCT: 41.8 % (ref 36.0–46.0)
Hemoglobin: 14.1 g/dL (ref 12.0–15.0)
Immature Granulocytes: 0 %
Lymphocytes Relative: 34 %
Lymphs Abs: 1.5 10*3/uL (ref 0.7–4.0)
MCH: 30 pg (ref 26.0–34.0)
MCHC: 33.7 g/dL (ref 30.0–36.0)
MCV: 88.9 fL (ref 80.0–100.0)
Monocytes Absolute: 0.5 10*3/uL (ref 0.1–1.0)
Monocytes Relative: 11 %
Neutro Abs: 2.2 10*3/uL (ref 1.7–7.7)
Neutrophils Relative %: 52 %
Platelets: 143 10*3/uL — ABNORMAL LOW (ref 150–400)
RBC: 4.7 MIL/uL (ref 3.87–5.11)
RDW: 11.9 % (ref 11.5–15.5)
WBC: 4.3 10*3/uL (ref 4.0–10.5)
nRBC: 0 % (ref 0.0–0.2)

## 2019-07-13 LAB — URINALYSIS, ROUTINE W REFLEX MICROSCOPIC
Bilirubin Urine: NEGATIVE
Glucose, UA: NEGATIVE mg/dL
Ketones, ur: NEGATIVE mg/dL
Leukocytes,Ua: NEGATIVE
Nitrite: NEGATIVE
Protein, ur: NEGATIVE mg/dL
Specific Gravity, Urine: 1.02 (ref 1.005–1.030)
pH: 6 (ref 5.0–8.0)

## 2019-07-13 LAB — POCT URINALYSIS DIPSTICK
Bilirubin, UA: NEGATIVE
Blood, UA: NEGATIVE
Glucose, UA: NEGATIVE
Ketones, UA: NEGATIVE
Leukocytes, UA: NEGATIVE
Nitrite, UA: NEGATIVE
Protein, UA: NEGATIVE
Spec Grav, UA: <=1.005 — AB (ref 1.010–1.025)
Urobilinogen, UA: 0.2 E.U./dL
pH, UA: 6 (ref 5.0–8.0)

## 2019-07-13 LAB — URINALYSIS, MICROSCOPIC (REFLEX)

## 2019-07-13 LAB — C-REACTIVE PROTEIN: CRP: 0.6 mg/dL (ref <1.0)

## 2019-07-13 LAB — COMPREHENSIVE METABOLIC PANEL
ALT: 15 U/L (ref 0–44)
AST: 19 U/L (ref 15–41)
Albumin: 4 g/dL (ref 3.5–5.0)
Alkaline Phosphatase: 61 U/L (ref 38–126)
Anion gap: 10 (ref 5–15)
BUN: 8 mg/dL (ref 6–20)
CO2: 24 mmol/L (ref 22–32)
Calcium: 9 mg/dL (ref 8.9–10.3)
Chloride: 105 mmol/L (ref 98–111)
Creatinine, Ser: 0.7 mg/dL (ref 0.44–1.00)
GFR calc Af Amer: >60 mL/min (ref >60)
GFR calc non Af Amer: >60 mL/min (ref >60)
Glucose, Bld: 101 mg/dL — ABNORMAL HIGH (ref 70–99)
Potassium: 4.2 mmol/L (ref 3.5–5.1)
Sodium: 139 mmol/L (ref 135–145)
Total Bilirubin: 0.3 mg/dL (ref 0.3–1.2)
Total Protein: 7 g/dL (ref 6.5–8.1)

## 2019-07-13 LAB — NOVEL CORONAVIRUS, NAA (HOSP ORDER, SEND-OUT TO REF LAB; TAT 18-24 HRS): SARS-CoV-2, NAA: NOT DETECTED

## 2019-07-13 LAB — SEDIMENTATION RATE: Sed Rate: 6 mm/hr (ref 0–22)

## 2019-07-13 LAB — LACTIC ACID, PLASMA: Lactic Acid, Venous: 0.6 mmol/L (ref 0.5–1.9)

## 2019-07-13 LAB — PREGNANCY, URINE: Preg Test, Ur: NEGATIVE

## 2019-07-13 LAB — LIPASE, BLOOD: Lipase: 21 U/L (ref 11–51)

## 2019-07-13 MED ORDER — ONDANSETRON HCL 4 MG/2ML IJ SOLN
4.0000 mg | Freq: Once | INTRAMUSCULAR | Status: AC
  Administered 2019-07-13: 4 mg via INTRAVENOUS
  Filled 2019-07-13: qty 2

## 2019-07-13 MED ORDER — IOHEXOL 300 MG/ML  SOLN
100.0000 mL | Freq: Once | INTRAMUSCULAR | Status: AC | PRN
  Administered 2019-07-13: 100 mL via INTRAVENOUS

## 2019-07-13 MED ORDER — KETOROLAC TROMETHAMINE 30 MG/ML IJ SOLN
30.0000 mg | Freq: Once | INTRAMUSCULAR | Status: AC
  Administered 2019-07-13: 30 mg via INTRAVENOUS
  Filled 2019-07-13: qty 1

## 2019-07-13 MED ORDER — DEXAMETHASONE SODIUM PHOSPHATE 10 MG/ML IJ SOLN
10.0000 mg | Freq: Once | INTRAMUSCULAR | Status: AC
  Administered 2019-07-13: 10 mg via INTRAVENOUS
  Filled 2019-07-13: qty 1

## 2019-07-13 MED ORDER — NAPROXEN 500 MG PO TABS: 500.0000 mg | ORAL_TABLET | Freq: Two times a day (BID) | ORAL | 0 refills | Status: DC

## 2019-07-13 MED ORDER — MORPHINE SULFATE (PF) 4 MG/ML IV SOLN
4.0000 mg | Freq: Once | INTRAVENOUS | Status: AC
  Administered 2019-07-13: 4 mg via INTRAVENOUS
  Filled 2019-07-13: qty 1

## 2019-07-13 MED ORDER — LIDOCAINE 5 % EX PTCH: 1.0000 | MEDICATED_PATCH | CUTANEOUS | 0 refills | Status: DC

## 2019-07-13 NOTE — Discharge Instructions (Addendum)
You were evaluated in the Emergency Department and after careful evaluation, we did not find any emergent condition requiring admission or further testing in the hospital.  Your exam/testing today is overall reassuring.  Your symptoms seem to be due to a disc herniation in the lower lumbar spine.  As discussed, please take the medications provided and follow-up with the orthopedic specialist.  Please return to the Emergency Department if you experience any worsening of your condition.  We encourage you to follow up with a primary care provider.  Thank you for allowing [place] to be a part of your care.

## 2019-07-13 NOTE — ED Notes (Signed)
ED Provider at bedside. 

## 2019-07-13 NOTE — ED Triage Notes (Signed)
Back pain on and off for a month. She was seen at [facility] ED and treated for UTI and muscle strain.

## 2019-07-13 NOTE — ED Notes (Signed)
Patient transported to CT 

## 2019-07-13 NOTE — ED Provider Notes (Signed)
  Provider Note MRN:  [id number]  Arrival date & time: 07/13/19    ED Course and Medical Decision Making  Assumed care from Dr. Rex Kras at shift change.  Lower back pain with evidence of disc herniation CT.  No neurological deficits, no bowel or bladder dysfunction, nothing to suggest myelopathy.  Patient is feeling better after pain medication here in the ED, able to ambulate, she feels well enough for discharge and will follow up with PCP and possibly orthopedics if not feeling better soon.  Strict return precautions for numbness or weakness or issues with bowel or bladder dysfunction.  Procedures  Final Clinical Impressions(s) / ED Diagnoses     ICD-10-CM   1. Bilateral low back pain with sciatica, sciatica laterality unspecified, unspecified chronicity  M54.40   2. Low back pain  M54.5   3. Lumbosacral disc herniation  M51.27     ED Discharge Orders         Ordered    naproxen (NAPROSYN) 500 MG tablet  2 times daily     07/13/19 1546    lidocaine (LIDODERM) 5 %  Every 24 hours     07/13/19 1546            Discharge Instructions     You were evaluated in the Emergency Department and after careful evaluation, we did not find any emergent condition requiring admission or further testing in the hospital.  Your exam/testing today is overall reassuring.  Your symptoms seem to be due to a disc herniation in the lower lumbar spine.  As discussed, please take the medications provided and follow-up with the orthopedic specialist.  Please return to the Emergency Department if you experience any worsening of your condition.  We encourage you to follow up with a primary care provider.  Thank you for allowing [place] to be a part of your care.    Barth Kirks. [place]onia Small, [place] [email or web address]    Maudie Flakes, MD 07/13/19 [phone]

## 2019-07-13 NOTE — Patient Instructions (Signed)
It was great to see you!  Please go to [facility] ER  Address: [address] [place] [place], [place] [address]  [phone]  Take care,  Inda Coke PA-C

## 2019-07-13 NOTE — Progress Notes (Signed)
Terri Wilson is a 34 y.o. female here for a new problem.  I acted as a Education administrator for [employer], PA-C Anselmo Pickler, LPN  History of Present Illness:   Chief Complaint  Patient presents with  . Back Pain  . Fever  . Headache    HPI   Back pain Pt c/o low back pain radiating up back and around to abdomen. Started on Wednesday, she went to Urgent care on Friday 07/10/19. Urgent care dx with pulled muscle and UTI, started her on macrobid, flexeril, naproxen. Took one dose of Macrobid and now has had significant diarrhea x 3 days. Spent all weekend in the bed with fever 99-101. Also having headache. Denies dysuria.  She reports that she is in severe pain. On the ride over, every bump caused worsening pain and symptoms.   CMP and CBC were essentially WNL. Urine pregnancy test was negative. Urine culture stated "multiple species present, recommend recollection."  COVID test pending.   Past Medical History:  Diagnosis Date  . Allergic rhinitis   . Anxiety and depression   . Chiari malformation    7 mm  . Depression   . Family history of breast cancer   . Family history of gene mutation   . Family history of melanoma   . Family history of prostate cancer   . Gestational diabetes   . Herpes labialis   . Migraine   . Panic attack      Social History   Socioeconomic History  . Marital status: Married    Spouse name: [name]  . Number of children: 2  . Years of education: 74  . Highest education level: Not on file  Occupational History  . Occupation: Administrator: [employer]    Comment: [employer]  Tobacco Use  . Smoking status: Former Smoker    Packs/day: 0.50    Years: 10.00    Pack years: 5.00    Types: Cigarettes    Quit date: 02/19/2019    Years since quitting: 0.3  . Smokeless tobacco: Former Network engineer and Sexual Activity  . Alcohol use: Yes    Alcohol/week: 0.0 standard drinks    Comment: occ  . Drug use: No  . Sexual activity:  Yes    Partners: Male  Other Topics Concern  . Not on file  Social History Narrative   Lives with husband. Caffeine Use: 40 mg/day.   Social Determinants of Health   Financial Resource Strain:   . Difficulty of Paying Living Expenses: Not on file  Food Insecurity:   . Worried About Charity fundraiser in the Last Year: Not on file  . Ran Out of Food in the Last Year: Not on file  Transportation Needs:   . Lack of Transportation (Medical): Not on file  . Lack of Transportation (Non-Medical): Not on file  Physical Activity:   . Days of Exercise per Week: Not on file  . Minutes of Exercise per Session: Not on file  Stress:   . Feeling of Stress : Not on file  Social Connections:   . Frequency of Communication with Friends and Family: Not on file  . Frequency of Social Gatherings with Friends and Family: Not on file  . Attends Religious Services: Not on file  . Active Member of Clubs or Organizations: Not on file  . Attends Archivist Meetings: Not on file  . Marital Status: Not on file  Intimate Partner Violence:   .  Fear of Current or Ex-Partner: Not on file  . Emotionally Abused: Not on file  . Physically Abused: Not on file  . Sexually Abused: Not on file    Past Surgical History:  Procedure Laterality Date  . TONSILLECTOMY AND ADENOIDECTOMY  2001  . WISDOM TOOTH EXTRACTION      Family History  Problem Relation Age of Onset  . Asthma Mother   . Heart disease Father 45  . Alcohol abuse Father   . Depression Father   . Heart attack Father   . Von Willebrand disease Sister   . Cancer Sister   . Breast cancer Maternal Grandmother   . Colon cancer Maternal Grandfather   . Hyperlipidemia Other   . Hypertension Other   . Obesity Other   . Heart disease Other   . Heart disease Paternal Aunt   . Cancer Paternal Grandfather     Allergies  Allergen Reactions  . Lamictal [Lamotrigine] Anaphylaxis  . Wellbutrin [Bupropion] Other (See Comments)     Increased anxiety, weight loss, OCD  . Vancomycin Other (See Comments)    Feels hot    Current Medications:   Current Outpatient Medications:  .  ibuprofen (ADVIL) 200 MG tablet, Take 800 mg by mouth every 6 (six) hours as needed for mild pain., Disp: , Rfl:  .  ALPRAZolam (XANAX) 0.5 MG tablet, TAKE 1 TABLET (0.5 MG TOTAL) BY MOUTH 2 TIMES DAILY AS NEEDED FOR ANXIETY. (Patient not taking: Reported on 07/13/2019), Disp: 60 tablet, Rfl: 0 .  citalopram (CELEXA) 40 MG tablet, Take 1 tablet (40 mg total) by mouth daily. (Patient not taking: Reported on 07/13/2019), Disp: 90 tablet, Rfl: 2 .  cyclobenzaprine (FLEXERIL) 10 MG tablet, Take 1 tablet (10 mg total) by mouth 2 (two) times daily as needed for muscle spasms. (Patient not taking: Reported on 07/13/2019), Disp: 20 tablet, Rfl: 0 .  Multiple Vitamins-Minerals (MULTIVITAMIN WOMEN) TABS, Take 1 tablet by mouth daily., Disp: , Rfl:  .  naproxen (NAPROSYN) 500 MG tablet, Take 1 tablet (500 mg total) by mouth 2 (two) times daily. (Patient not taking: Reported on 07/13/2019), Disp: 30 tablet, Rfl: 0 .  nitrofurantoin, macrocrystal-monohydrate, (MACROBID) 100 MG capsule, Take 1 capsule (100 mg total) by mouth 2 (two) times daily for 5 days. (Patient not taking: Reported on 07/13/2019), Disp: 10 capsule, Rfl: 0   Review of Systems:   ROS  Negative unless otherwise specified per HPI.  Vitals:   Vitals:   07/13/19 1035  BP: 100/70  Pulse: (!) 106  Temp: 98 F (36.7 C)  TempSrc: Temporal  SpO2: 98%  Weight: 138 lb 6.1 oz (62.8 kg)  Height: 5\' 7"  (1.702 m)     Body mass index is 21.67 kg/m.  Physical Exam:   Physical Exam Constitutional:      General: She is in acute distress.     Appearance: She is well-developed. She is ill-appearing.  HENT:     Head: Normocephalic and atraumatic.  Eyes:     Conjunctiva/sclera: Conjunctivae normal.  Pulmonary:     Effort: Pulmonary effort is normal.  Abdominal:     General: Abdomen is flat. Bowel  sounds are normal.     Palpations: Abdomen is soft.     Tenderness: There is abdominal tenderness.  Musculoskeletal:        General: Normal range of motion.     Cervical back: Normal range of motion and neck supple.  Skin:    General: Skin is warm and dry.  Neurological:     Mental Status: She is alert and oriented to person, place, and time.  Psychiatric:        Behavior: Behavior normal.        Thought Content: Thought content normal.        Judgment: Judgment normal.     Results for orders placed or performed in visit on 07/13/19  POCT urinalysis dipstick  Result Value Ref Range   Color, UA [place]    Clarity, UA Clear    Glucose, UA Negative Negative   Bilirubin, UA Negative    Ketones, UA Negative    Spec Grav, UA <=1.005 (A) 1.010 - 1.025   Blood, UA Negative    pH, UA 6.0 5.0 - 8.0   Protein, UA Negative Negative   Urobilinogen, UA 0.2 0.2 or 1.0 E.U./dL   Nitrite, UA Negative    Leukocytes, UA Negative Negative   Appearance     Odor      Assessment and Plan:   Faatimah was seen today for back pain, fever and headache.  Diagnoses and all orders for this visit:  Generalized abdominal pain -     POCT urinalysis dipstick -     Urine Culture   Concern for acute abdomen. Patient is in significant distress. Her mother drove her to the office today. Recommended that she proceed to the ER for further evaluation and treatment. Patient in agreement to plan and verbalized that she would go at this time, and her mother will take her.  . Reviewed expectations re: course of current medical issues. . Discussed self-management of symptoms. . Outlined signs and symptoms indicating need for more acute intervention. . Patient verbalized understanding and all questions were answered. . See orders for this visit as documented in the electronic medical record. . Patient received an After-Visit Summary.  CMA or LPN served as scribe during this visit. History, Physical, and Plan  performed by medical provider. The above documentation has been reviewed and is accurate and complete.   Inda Coke, PA-C

## 2019-07-13 NOTE — ED Provider Notes (Signed)
[facility] Provider Note   CSN: [id number] Arrival date & time: 07/13/19  1112     History Chief Complaint  Patient presents with   Back Pain    Terri Wilson is a 34 y.o. female.  HPI   Patient presents with a several month history of lumbar back pain that acutely got worse roughly 5 days ago.  Initially the back pain was located on the left side with radiation to the left lower abdomen.  During the last 5 days the pain has become bilateral.  She has also had associated paresthesias in her lower extremities.  Patient was seen in urgent care over the weekend was found to have a urinary tract infection.  She was treated with Macrobid.  Patient states that she took a single dose and started to have significant diarrhea immediately stopped taking the medication.  Admits to at least one episode of fever at 24 F with night sweats.  Otherwise patient denies any other symptoms of systemic infection including arthralgias or myalgias.  The only medication that she admits to taking over the last week are Flexeril and ibuprofen.  She denies any trauma to her back or abdomen, sick contacts, eating anything out of the ordinary, or recent travel.  She denies any chest pain, shortness of breath, cough.     Past Medical History:  Diagnosis Date   Allergic rhinitis    Anxiety and depression    Chiari malformation    7 mm   Depression    Family history of breast cancer    Family history of gene mutation    Family history of melanoma    Family history of prostate cancer    Gestational diabetes    Herpes labialis    Migraine    Panic attack     Patient Active Problem List   Diagnosis Date Noted   Genetic testing 11/12/2018   Family history of breast cancer    Family history of prostate cancer    Family history of melanoma    GAD (generalized anxiety disorder) 09/04/2018   Primary insomnia 09/04/2018   Recurrent major depressive  disorder, in partial remission ([place]) 09/04/2018   Severe recurrent major depression ([place]) 07/04/2018   Family history of von Willebrand disease 06/24/2018   History of gestational diabetes 06/24/2018   PVC (premature ventricular contraction) 09/21/2015   Pulmonary nodule 12/13/2014   Cigarette smoker 12/04/2014   ADD (attention deficit disorder) 03/17/2014   Migraine 04/25/2012   AR (allergic rhinitis) 04/25/2012   Anxiety and depression    Panic attack     Past Surgical History:  Procedure Laterality Date   TONSILLECTOMY AND ADENOIDECTOMY  2001   WISDOM TOOTH EXTRACTION       OB History    Gravida  2   Para  2   Term  2   Preterm  0   AB  0   Living  2     SAB  0   TAB  0   Ectopic  0   Multiple  0   Live Births  2           Family History  Problem Relation Age of Onset   Asthma Mother    Heart disease Father 13   Alcohol abuse Father    Depression Father    Heart attack Father    Von Willebrand disease Sister    Cancer Sister    Breast cancer Maternal Grandmother  Colon cancer Maternal Grandfather    Hyperlipidemia Other    Hypertension Other    Obesity Other    Heart disease Other    Heart disease Paternal Aunt    Cancer Paternal Grandfather     Social History   Tobacco Use   Smoking status: Former Smoker    Packs/day: 0.50    Years: 10.00    Pack years: 5.00    Types: Cigarettes    Quit date: 02/19/2019    Years since quitting: 0.3   Smokeless tobacco: Former Systems developer  Substance Use Topics   Alcohol use: Yes    Alcohol/week: 0.0 standard drinks    Comment: occ   Drug use: No    Home Medications Prior to Admission medications   Medication Sig Start Date End Date Taking? Authorizing Provider  ALPRAZolam (XANAX) 0.5 MG tablet TAKE 1 TABLET (0.5 MG TOTAL) BY MOUTH 2 TIMES DAILY AS NEEDED FOR ANXIETY. Patient not taking: Reported on 07/13/2019 12/26/18   Briscoe Deutscher, DO  citalopram (CELEXA) 40 MG  tablet Take 1 tablet (40 mg total) by mouth daily. Patient not taking: Reported on 07/13/2019 09/04/18   Briscoe Deutscher, DO  cyclobenzaprine (FLEXERIL) 10 MG tablet Take 1 tablet (10 mg total) by mouth 2 (two) times daily as needed for muscle spasms. Patient not taking: Reported on 07/13/2019 07/10/19   Faustino Congress, NP  ibuprofen (ADVIL) 200 MG tablet Take 800 mg by mouth every 6 (six) hours as needed for mild pain.    [provider]  Multiple Vitamins-Minerals (MULTIVITAMIN WOMEN) TABS Take 1 tablet by mouth daily.    [provider]  naproxen (NAPROSYN) 500 MG tablet Take 1 tablet (500 mg total) by mouth 2 (two) times daily. Patient not taking: Reported on 07/13/2019 07/10/19   Faustino Congress, NP  nitrofurantoin, macrocrystal-monohydrate, (MACROBID) 100 MG capsule Take 1 capsule (100 mg total) by mouth 2 (two) times daily for 5 days. Patient not taking: Reported on 07/13/2019 07/10/19 07/15/19  Faustino Congress, NP    Allergies    Lamictal [lamotrigine], Wellbutrin [bupropion], and Vancomycin  Review of Systems   Review of Systems  Physical Exam Updated Vital Signs BP 118/86    Pulse (!) 102    Temp 98 F (36.7 C) (Oral)    Resp 15    Ht '5\' 7"'$  (1.702 m)    Wt 61.2 kg    LMP 07/01/2019 (Exact Date)    SpO2 98%    BMI 21.14 kg/m   Physical Exam  ED Results / Procedures / Treatments   Labs (all labs ordered are listed, but only abnormal results are displayed) Labs Reviewed  CBC WITH DIFFERENTIAL/PLATELET - Abnormal; Notable for the following components:      Result Value   Platelets 143 (*)    All other components within normal limits  COMPREHENSIVE METABOLIC PANEL - Abnormal; Notable for the following components:   Glucose, Bld 101 (*)    All other components within normal limits  URINALYSIS, ROUTINE W REFLEX MICROSCOPIC - Abnormal; Notable for the following components:   Hgb urine dipstick TRACE (*)    All other components within normal limits    URINALYSIS, MICROSCOPIC (REFLEX) - Abnormal; Notable for the following components:   Bacteria, UA FEW (*)    All other components within normal limits  URINE CULTURE  CULTURE, BLOOD (ROUTINE X 2)  CULTURE, BLOOD (ROUTINE X 2)  PREGNANCY, URINE  LACTIC ACID, PLASMA  LIPASE, BLOOD  SEDIMENTATION RATE  C-REACTIVE PROTEIN  EKG None  Radiology CT ABDOMEN PELVIS W CONTRAST  Result Date: 07/13/2019 CLINICAL DATA:  Low back pain radiating to abdomen since Wednesday, UTI EXAM: CT ABDOMEN AND PELVIS WITH CONTRAST TECHNIQUE: Multidetector CT imaging of the abdomen and pelvis was performed using the standard protocol following bolus administration of intravenous contrast. CONTRAST:  111m OMNIPAQUE IOHEXOL 300 MG/ML  SOLN COMPARISON:  None. FINDINGS: Lower chest: No acute abnormality. Hepatobiliary: No solid liver abnormality is seen. No gallstones, gallbladder wall thickening, or biliary dilatation. Pancreas: Unremarkable. No pancreatic ductal dilatation or surrounding inflammatory changes. Spleen: Normal in size without significant abnormality. Adrenals/Urinary Tract: Adrenal glands are unremarkable. Kidneys are normal, without renal calculi, solid lesion, or hydronephrosis. Bladder is unremarkable. Stomach/Bowel: Stomach is within normal limits. Appendix appears normal (series 5, image 42). No evidence of bowel wall thickening, distention, or inflammatory changes. Vascular/Lymphatic: No significant vascular findings are present. No enlarged abdominal or pelvic lymph nodes. Reproductive: No mass or other significant abnormality. IUD is present in the endometrial cavity. Fluid attenuation cysts or follicles of the ovaries. Other: No abdominal wall hernia or abnormality. No abdominopelvic ascites. Musculoskeletal: No acute or significant osseous findings. IMPRESSION: 1.  No acute CT findings of the abdomen or pelvis to explain pain. 2.  No CT findings of urinary tract infection. Electronically Signed    By: AEddie CandleM.D.   On: 07/13/2019 14:12   CT L-SPINE NO CHARGE  Result Date: 07/13/2019 CLINICAL DATA:  Low back pain radiating to abdomen EXAM: CT LUMBAR SPINE WITH CONTRAST TECHNIQUE: Multidetector CT imaging of the lumbar spine was performed with intravenous contrast administration. CONTRAST:  1046mOMNIPAQUE IOHEXOL 300 MG/ML  SOLN COMPARISON:  None. FINDINGS: Segmentation: 5 lumbar type vertebrae. Alignment: Normal. Vertebrae: No acute fracture or focal pathologic process. Paraspinal and other soft tissues: Negative. Disc levels: Small, broad-based central posterior disc bulge of L5-S1. Disc spaces are otherwise intact. IMPRESSION: 1.  No acute CT abnormality of the lumbar spine. 2. Small, broad-based central posterior disc bulge of L5-S1. Disc spaces are otherwise intact and vertebral body heights are preserved. Electronically Signed   By: AlEddie Candle.D.   On: 07/13/2019 14:15    Procedures Procedures (including critical care time)  Medications Ordered in ED Medications  ondansetron (ZOFRAN) injection 4 mg (4 mg Intravenous Given 07/13/19 1247)  morphine 4 MG/ML injection 4 mg (4 mg Intravenous Given 07/13/19 1247)  iohexol (OMNIPAQUE) 300 MG/ML solution 100 mL (100 mLs Intravenous Contrast Given 07/13/19 1351)  ketorolac (TORADOL) 30 MG/ML injection 30 mg (30 mg Intravenous Given 07/13/19 1513)  dexamethasone (DECADRON) injection 10 mg (10 mg Intravenous Given 07/13/19 1513)    ED Course  I have reviewed the triage vital signs and the nursing notes.  Pertinent labs & imaging results that were available during my care of the patient were reviewed by me and considered in my medical decision making (see chart for details).    MDM Rules/Calculators/A&P                      Lumbar back pain with radiculopathy Lumbosacral disc herniation Patient presented with progression of her lumbar back pain.  Patient's work-up for infectious etiologies has been within normal limits. Her white  blood count, ESR, CT of the abdomen pelvis are within normal limits.  CT of the lumbar spine does show a disc herniation at the level of L5-S1 could be contributing to her lumbar symptoms.  Her pain has been controlled with single dose of  morphine.  Patient was counseled on the next step in her work-up including an MRI of her back.  She states that she would prefer to follow-up in the outpatient setting with an MRI.  We will give her Decadron 10 mg IV and Toradol 10 mg IV for further pain management and discharged with close outpatient follow-up.  Patient is understanding return precautions. -Discharged outpatient follow-up with PCP -Patient was given Toradol 30 mg IV -Patient was given Decadron 10 mg IV   Final Clinical Impression(s) / ED Diagnoses Final diagnoses:  Low back pain  Bilateral low back pain with sciatica, sciatica laterality unspecified, unspecified chronicity  Lumbosacral disc herniation    Rx / DC Orders ED Discharge Orders    None       Marianna Payment, MD 07/13/19 [place], Wenda Overland, MD 07/14/19 1353

## 2019-07-14 ENCOUNTER — Telehealth: Payer: Self-pay | Admitting: *Deleted

## 2019-07-14 DIAGNOSIS — M545 Low back pain, unspecified: Secondary | ICD-10-CM

## 2019-07-14 LAB — URINE CULTURE
Culture: NO GROWTH
MICRO NUMBER:: 10173363
Result:: NO GROWTH
SPECIMEN QUALITY:: ADEQUATE

## 2019-07-14 NOTE — Telephone Encounter (Signed)
Spoke to pt , asked her how she was feeling today? Pt said better, now that she knows that the Naproxen is helping her. She said she took one this morning and is now able to function and pain has eased. Told her good. Told her Aldona Bar reviewed the ED note and said that if you are okay she will put in refer her urgently to Dr. Sherene Sires to evaluate her back? He can help [place] determine if MRI is warranted. Pt verbalized understanding and would like referral. Told pt I will place referral and someone will contact you to schedule. Pt verbalized understanding.

## 2019-07-15 ENCOUNTER — Encounter: Payer: Self-pay | Admitting: Family Medicine

## 2019-07-15 ENCOUNTER — Other Ambulatory Visit: Payer: Self-pay

## 2019-07-15 ENCOUNTER — Ambulatory Visit (INDEPENDENT_AMBULATORY_CARE_PROVIDER_SITE_OTHER): Payer: BC Managed Care – PPO | Admitting: Family Medicine

## 2019-07-15 VITALS — BP 116/80 | HR 108 | Ht 67.0 in | Wt 139.0 lb

## 2019-07-15 DIAGNOSIS — R29898 Other symptoms and signs involving the musculoskeletal system: Secondary | ICD-10-CM | POA: Diagnosis not present

## 2019-07-15 DIAGNOSIS — M5416 Radiculopathy, lumbar region: Secondary | ICD-10-CM

## 2019-07-15 MED ORDER — PREDNISONE 50 MG PO TABS: 50.0000 mg | ORAL_TABLET | Freq: Every day | ORAL | 0 refills | Status: DC

## 2019-07-15 MED ORDER — OXYCODONE-ACETAMINOPHEN 5-325 MG PO TABS: 1.0000 | ORAL_TABLET | ORAL | 0 refills | Status: DC | PRN

## 2019-07-15 MED ORDER — BACLOFEN 10 MG PO TABS: 10.0000 mg | ORAL_TABLET | Freq: Three times a day (TID) | ORAL | 1 refills | Status: DC | PRN

## 2019-07-15 MED ORDER — ONDANSETRON 4 MG PO TBDP: 4.0000 mg | ORAL_TABLET | Freq: Three times a day (TID) | ORAL | 0 refills | Status: DC | PRN

## 2019-07-15 MED ORDER — GABAPENTIN 300 MG PO CAPS: ORAL_CAPSULE | ORAL | 3 refills | Status: DC

## 2019-07-15 NOTE — Progress Notes (Signed)
Subjective:    I'm seeing this patient as a consultation for:  Terri Wilson, Terri Wilson. Note will be routed back to referring provider/PCP.  CC: Low back pain  I, Molly Weber, LAT, ATC, am serving as scribe for Dr. Lynne Leader.  HPI: Pt is a 34 y/o female presenting w/ c/o low back pain and radiating pain into her B LEs x several months that worsened 5 days ago.  Three months ago she was vacuuming and felt sharp pain when she stood up from having been vacuuming at a low level.  She was most recently seen by both her PCP and at [facility] ED on 07/13/19 and had CTs of her L-spine and abdomen/pelvis.  While at the ED, she received IV Decadron (10mg ) and Toradol (30 mg).  Since her visit to the ED, pt reports that her low back pain was severe last night .  She rates her low back pain at a 9-10/10 at it's worst but will have 0/10 pain if in a comfortable position and describes her pain as grabbing/pressure sensation.  She is also experiencing a lot of muscle spasms in her back.  Radiating pain: proximally into t-spine Numbness/tingling: Yes into B LEs all the way to her feet and toes Weakness: Yes in her B LEs Aggravating factors:forward flexion; picking up items of any weight; driving; transitioning from sit-to-stand and vice-versa Treatments tried: Flexeril, IBU; hydrocodone (prescribed previously but not on med list) and naproxen  Patient also notes that she is having some urinary incontinence and notes that her legs feel weaker than normal.  She notes numbness into her feet as well.  Diagnostic testing: L-spine XR- 07/10/19; L-spine and abdomen/pelvis CT- 07/13/19  Past medical history, Surgical history, Family history, Social history, Allergies, and medications have been entered into the medical record, reviewed.   Review of Systems: No fevers or chills.  Objective:    Vitals:   07/15/19 1009  BP: 116/80  Pulse: (!) 108  SpO2: 98%   General: Well Developed, well nourished, and in  no acute distress.  Tearful at times. Neuro/Psych: Alert and oriented x3, extra-ocular muscles intact, able to move all 4 extremities, sensation grossly intact. Skin: Warm and dry, no rashes noted.  Respiratory: Not using accessory muscles, speaking in full sentences, trachea midline.  Cardiovascular: Pulses palpable, no extremity edema. Abdomen: Does not appear distended. MSK:  L-spine: Nontender to spinal midline.  Mildly tender palpation bilateral lumbar paraspinal musculature.   Significantly decreased lumbar motion. Sensation is intact bilateral lower extremities to light touch. Lower extremity strength. Hip flexion 4/5 bilaterally. Hip abduction 4/5 bilaterally. Hip adduction 3+/5 bilaterally. Knee extension 4/5 bilaterally. Knee flexion 4/5 bilaterally. Foot dorsiflexion 4/5 bilaterally. Foot plantarflexion 3+/5 bilaterally. Significant antalgic gait. Reflexes diminished but equal bilateral knees and ankles.   Lab and Radiology Results Results for orders placed or performed during the hospital encounter of 07/13/19 (from the past 72 hour(s))  Blood culture (routine x 2)     Status: None (Preliminary result)   Collection Time: 07/13/19 12:31 PM   Specimen: BLOOD  Result Value Ref Range   Specimen Description      BLOOD RIGHT ANTECUBITAL Performed at [facility], [address] [place], [place] [address]    Special Requests      BOTTLES DRAWN AEROBIC AND ANAEROBIC Blood Culture adequate volume Performed at [facility], [address], [place], [place] [address]    Culture  NO GROWTH < 24 HOURS Performed at [facility] [address] [place], [place] [address]    Report Status PENDING   CBC with Differential     Status: Abnormal   Collection Time: 07/13/19 12:33 PM  Result Value Ref Range   WBC 4.3 4.0 - 10.5 K/uL   RBC 4.70 3.87 - 5.11 MIL/uL   Hemoglobin 14.1 12.0 - 15.0 g/dL   HCT 41.8 36.0 - 46.0 %   MCV 88.9 80.0 - 100.0  fL   MCH 30.0 26.0 - 34.0 pg   MCHC 33.7 30.0 - 36.0 g/dL   RDW 11.9 11.5 - 15.5 %   Platelets 143 (L) 150 - 400 K/uL   nRBC 0.0 0.0 - 0.2 %   Neutrophils Relative % 52 %   Neutro Abs 2.2 1.7 - 7.7 K/uL   Lymphocytes Relative 34 %   Lymphs Abs 1.5 0.7 - 4.0 K/uL   Monocytes Relative 11 %   Monocytes Absolute 0.5 0.1 - 1.0 K/uL   Eosinophils Relative 2 %   Eosinophils Absolute 0.1 0.0 - 0.5 K/uL   Basophils Relative 1 %   Basophils Absolute 0.0 0.0 - 0.1 K/uL   Immature Granulocytes 0 %   Abs Immature Granulocytes 0.01 0.00 - 0.07 K/uL    Comment: Performed at [facility], [address] [place], [place] [address]  Comprehensive metabolic panel     Status: Abnormal   Collection Time: 07/13/19 12:33 PM  Result Value Ref Range   Sodium 139 135 - 145 mmol/L   Potassium 4.2 3.5 - 5.1 mmol/L   Chloride 105 98 - 111 mmol/L   CO2 24 22 - 32 mmol/L   Glucose, Bld 101 (H) 70 - 99 mg/dL   BUN 8 6 - 20 mg/dL   Creatinine, Ser 0.70 0.44 - 1.00 mg/dL   Calcium 9.0 8.9 - 10.3 mg/dL   Total Protein 7.0 6.5 - 8.1 g/dL   Albumin 4.0 3.5 - 5.0 g/dL   AST 19 15 - 41 U/L   ALT 15 0 - 44 U/L   Alkaline Phosphatase 61 38 - 126 U/L   Total Bilirubin 0.3 0.3 - 1.2 mg/dL   GFR calc non Af Amer >60 >60 mL/min   GFR calc Af Amer >60 >60 mL/min   Anion gap 10 5 - 15    Comment: Performed at [facility], [address] [place], [place] [address]  Urinalysis, Routine w reflex microscopic     Status: Abnormal   Collection Time: 07/13/19 12:33 PM  Result Value Ref Range   Color, Urine YELLOW YELLOW   APPearance CLEAR CLEAR   Specific Gravity, Urine 1.020 1.005 - 1.030   pH 6.0 5.0 - 8.0   Glucose, UA NEGATIVE NEGATIVE mg/dL   Hgb urine dipstick TRACE (A) NEGATIVE   Bilirubin Urine NEGATIVE NEGATIVE   Ketones, ur NEGATIVE NEGATIVE mg/dL   Protein, ur NEGATIVE NEGATIVE mg/dL   Nitrite NEGATIVE NEGATIVE   Leukocytes,Ua NEGATIVE NEGATIVE    Comment: Performed at [facility], [address] [place], [place] [address]  Urine culture     Status: None   Collection Time: 07/13/19 12:33 PM   Specimen: Urine, Clean Catch  Result Value Ref Range   Specimen Description      URINE, CLEAN CATCH Performed at [facility], [address] [place], [place] [address]    Special Requests  NONE Performed at [facility], [address] [place], [place] [address]    Culture      NO GROWTH Performed at [facility], [place] [address], [place], [place] [address]    Report Status 07/14/2019 FINAL   Pregnancy, urine     Status: None   Collection Time: 07/13/19 12:33 PM  Result Value Ref Range   Preg Test, Ur NEGATIVE NEGATIVE    Comment:        THE SENSITIVITY OF THIS METHODOLOGY IS >20 mIU/mL. Performed at [facility], [address] [place], [place] [address]   Lactic acid, plasma     Status: None   Collection Time: 07/13/19 12:33 PM  Result Value Ref Range   Lactic Acid, Venous 0.6 0.5 - 1.9 mmol/L    Comment: Performed at [facility], [address] [place], [place] [address]  Lipase, blood     Status: None   Collection Time: 07/13/19 12:33 PM  Result Value Ref Range   Lipase 21 11 - 51 U/L    Comment: Performed at [facility], [address] [place], [place] [address]  Urinalysis, Microscopic (reflex)     Status: Abnormal   Collection Time: 07/13/19 12:33 PM  Result Value Ref Range   RBC / HPF 0-5 0 - 5 RBC/hpf   WBC, UA 0-5 0 - 5 WBC/hpf   Bacteria, UA FEW (A) NONE SEEN   Squamous Epithelial / LPF 6-10 0 - 5    Comment: Performed at [facility], [address] [place], [place] [address]  Sedimentation rate     Status: None   Collection Time: 07/13/19 12:33 PM  Result Value Ref Range   Sed Rate 6 0 - 22 mm/hr    Comment: Performed at [facility], [address] [place], [place] [address]  Blood culture (routine x 2)     Status: None (Preliminary  result)   Collection Time: 07/13/19 12:34 PM   Specimen: BLOOD  Result Value Ref Range   Specimen Description      BLOOD LEFT ANTECUBITAL Performed at [facility], [address] [place], [place] [address]    Special Requests      BOTTLES DRAWN AEROBIC AND ANAEROBIC Blood Culture adequate volume Performed at [facility], [address] [place], [place] [address]    Culture      NO GROWTH < 24 HOURS Performed at [facility], [place] [address], [place], [place] [address]    Report Status PENDING   C-reactive protein     Status: None   Collection Time: 07/13/19  1:25 PM  Result Value Ref Range   CRP 0.6 <1.0 mg/dL    Comment: Performed at [facility], [place] [address] [place], [place] [address]   CT ABDOMEN PELVIS W CONTRAST  Result Date: 07/13/2019 CLINICAL DATA:  Low back pain radiating to abdomen since Wednesday, UTI EXAM: CT ABDOMEN AND PELVIS WITH CONTRAST TECHNIQUE: Multidetector CT imaging of the abdomen and pelvis was performed using the standard protocol following bolus administration of intravenous contrast. CONTRAST:  14mL OMNIPAQUE IOHEXOL 300 MG/ML  SOLN COMPARISON:  None. FINDINGS: Lower chest: No acute abnormality. Hepatobiliary: No solid liver abnormality is seen. No gallstones, gallbladder wall thickening, or biliary dilatation. Pancreas: Unremarkable. No pancreatic ductal dilatation or surrounding inflammatory changes. Spleen: Normal in size without significant  abnormality. Adrenals/Urinary Tract: Adrenal glands are unremarkable. Kidneys are normal, without renal calculi, solid lesion, or hydronephrosis. Bladder is unremarkable. Stomach/Bowel: Stomach is within normal limits. Appendix appears normal (series 5, image 42). No evidence of bowel wall thickening, distention, or inflammatory changes. Vascular/Lymphatic: No significant vascular findings are present. No enlarged abdominal or pelvic lymph nodes. Reproductive: No mass or other  significant abnormality. IUD is present in the endometrial cavity. Fluid attenuation cysts or follicles of the ovaries. Other: No abdominal wall hernia or abnormality. No abdominopelvic ascites. Musculoskeletal: No acute or significant osseous findings. IMPRESSION: 1.  No acute CT findings of the abdomen or pelvis to explain pain. 2.  No CT findings of urinary tract infection. Electronically Signed   By: Eddie Candle M.D.   On: 07/13/2019 14:12   CT L-SPINE NO CHARGE  Result Date: 07/13/2019 CLINICAL DATA:  Low back pain radiating to abdomen EXAM: CT LUMBAR SPINE WITH CONTRAST TECHNIQUE: Multidetector CT imaging of the lumbar spine was performed with intravenous contrast administration. CONTRAST:  172mL OMNIPAQUE IOHEXOL 300 MG/ML  SOLN COMPARISON:  None. FINDINGS: Segmentation: 5 lumbar type vertebrae. Alignment: Normal. Vertebrae: No acute fracture or focal pathologic process. Paraspinal and other soft tissues: Negative. Disc levels: Small, broad-based central posterior disc bulge of L5-S1. Disc spaces are otherwise intact. IMPRESSION: 1.  No acute CT abnormality of the lumbar spine. 2. Small, broad-based central posterior disc bulge of L5-S1. Disc spaces are otherwise intact and vertebral body heights are preserved. Electronically Signed   By: Eddie Candle M.D.   On: 07/13/2019 14:15  I, Lynne Leader, personally (independently) visualized and performed the interpretation of the images attached in this note.   Impression and Recommendations:    Assessment and Plan: 34 y.o. female with severe low back pain with new radicular component with new weakness and urinary incontinence.  Fortunately patient is already had some imaging including conventional x-rays and CT scan of L-spine.  She does not have severe abnormalities noted on these test.  However I am quite concerned given her new weakness and urinary incontinence.  I am concerned that she may have cauda equina syndrome and we will proceed with stat MRI.   Start treatment now with oral prednisone, gabapentin, baclofen and oxycodone. Recheck following MRI.  Precautions reviewed. Severe symptoms.  PDMP reviewed during this encounter. Orders Placed This Encounter  Procedures  . MR Lumbar Spine Wo Contrast    MRI needs to be done ASAP due to Cauda Equina syndrome concern    Standing Status:   Future    Standing Expiration Date:   09/11/2020    Order Specific Question:   What is the patient's sedation requirement?    Answer:   No Sedation    Order Specific Question:   Does the patient have a pacemaker or implanted devices?    Answer:   No    Order Specific Question:   Preferred imaging location?    Answer:   [facility] (table limit-500 lbs)    Order Specific Question:   Radiology Contrast Protocol - do NOT remove file path    Answer:   [email or web address]  . Ambulatory referral to Physical Therapy    Referral Priority:   Routine    Referral Type:   Physical Medicine    Referral Reason:   Specialty Services Required    Requested Specialty:   Physical Therapy   Meds ordered this encounter  Medications  . predniSONE (DELTASONE) 50 MG tablet    Sig: Take  1 tablet (50 mg total) by mouth daily.    Dispense:  5 tablet    Refill:  0  . gabapentin (NEURONTIN) 300 MG capsule    Sig: One tab PO qHS for a week, then BID for a week, then TID. May double weekly to a max of 3,600mg /day for nerve pain    Dispense:  180 capsule    Refill:  3  . baclofen (LIORESAL) 10 MG tablet    Sig: Take 1-2 tablets (10-20 mg total) by mouth 3 (three) times daily as needed for muscle spasms.    Dispense:  60 each    Refill:  1  . oxyCODONE-acetaminophen (PERCOCET/ROXICET) 5-325 MG tablet    Sig: Take 1 tablet by mouth every 4 (four) hours as needed for severe pain.    Dispense:  15 tablet    Refill:  0  . ondansetron (ZOFRAN ODT) 4 MG disintegrating tablet    Sig: Take 1 tablet (4 mg total) by mouth every 8 (eight) hours as  needed for nausea or vomiting.    Dispense:  20 tablet    Refill:  0    Discussed warning signs or symptoms. Please see discharge instructions. Patient expresses understanding.   The above documentation has been reviewed and is accurate and complete Lynne Leader

## 2019-07-15 NOTE — Patient Instructions (Addendum)
Thank you for coming in today. You should her soon about MRI.  Plan for prednisone for 5 days for a pinched nerve.  Use baclofen for muscle spasm  Use gabapentin for nerve pain up to 3x daily.  Use oxycodone for severe pain.  Follow up with me after MRI.  Plan for PT.   Radicular Pain Radicular pain is a type of pain that spreads from your back or neck along a spinal nerve. Spinal nerves are nerves that leave the spinal cord and go to the muscles. Radicular pain is sometimes called radiculopathy, radiculitis, or a pinched nerve. When you have this type of pain, you may also have weakness, numbness, or tingling in the area of your body that is supplied by the nerve. The pain may feel sharp and burning. Depending on which spinal nerve is affected, the pain may occur in the:  Neck area (cervical radicular pain). You may also feel pain, numbness, weakness, or tingling in the arms.  Mid-spine area (thoracic radicular pain). You would feel this pain in the back and chest. This type is rare.  Lower back area (lumbar radicular pain). You would feel this pain as low back pain. You may feel pain, numbness, weakness, or tingling in the buttocks or legs. Sciatica is a type of lumbar radicular pain that shoots down the back of the leg. Radicular pain occurs when one of the spinal nerves becomes irritated or squeezed (compressed). It is often caused by something pushing on a spinal nerve, such as one of the bones of the spine (vertebrae) or one of the round cushions between vertebrae (intervertebral disks). This can result from:  An injury.  Wear and tear or aging of a disk.  The growth of a bone spur that pushes on the nerve. Radicular pain often goes away when you follow instructions from your health care provider for relieving pain at home. Follow these instructions at home: Managing pain      If directed, put ice on the affected area: ? Put ice in a plastic bag. ? Place a towel between your  skin and the bag. ? Leave the ice on for 20 minutes, 2-3 times a day.  If directed, apply heat to the affected area as often as told by your health care provider. Use the heat source that your health care provider recommends, such as a moist heat pack or a heating pad. ? Place a towel between your skin and the heat source. ? Leave the heat on for 20-30 minutes. ? Remove the heat if your skin turns bright red. This is especially important if you are unable to feel pain, heat, or cold. You may have a greater risk of getting burned. Activity   Do not sit or rest in bed for long periods of time.  Try to stay as active as possible. Ask your health care provider what type of exercise or activity is best for you.  Avoid activities that make your pain worse, such as bending and lifting.  Do not lift anything that is heavier than 10 lb (4.5 kg), or the limit that you are told, until your health care provider says that it is safe.  Practice using proper technique when lifting items. Proper lifting technique involves bending your knees and rising up.  Do strength and range-of-motion exercises only as told by your health care provider or physical therapist. General instructions  Take over-the-counter and prescription medicines only as told by your health care provider.  Pay attention  to any changes in your symptoms.  Keep all follow-up visits as told by your health care provider. This is important. ? Your health care provider may send you to a physical therapist to help with this pain. Contact a health care provider if:  Your pain and other symptoms get worse.  Your pain medicine is not helping.  Your pain has not improved after a few weeks of home care.  You have a fever. Get help right away if:  You have severe pain, weakness, or numbness.  You have difficulty with bladder or bowel control. Summary  Radicular pain is a type of pain that spreads from your back or neck along a spinal  nerve.  When you have radicular pain, you may also have weakness, numbness, or tingling in the area of your body that is supplied by the nerve.  The pain may feel sharp or burning.  Radicular pain may be treated with ice, heat, medicines, or physical therapy. This information is not intended to replace advice given to you by your health care provider. Make sure you discuss any questions you have with your health care provider. Document Revised: 11/19/2017 Document Reviewed: 11/19/2017 Else[employer] Patient Education  [employer].

## 2019-07-18 ENCOUNTER — Other Ambulatory Visit: Payer: Self-pay

## 2019-07-18 ENCOUNTER — Ambulatory Visit (INDEPENDENT_AMBULATORY_CARE_PROVIDER_SITE_OTHER): Payer: BC Managed Care – PPO

## 2019-07-18 DIAGNOSIS — R29898 Other symptoms and signs involving the musculoskeletal system: Secondary | ICD-10-CM

## 2019-07-18 DIAGNOSIS — M5416 Radiculopathy, lumbar region: Secondary | ICD-10-CM | POA: Diagnosis not present

## 2019-07-18 DIAGNOSIS — M5126 Other intervertebral disc displacement, lumbar region: Secondary | ICD-10-CM | POA: Diagnosis not present

## 2019-07-18 LAB — CULTURE, BLOOD (ROUTINE X 2)
Culture: NO GROWTH
Culture: NO GROWTH
Special Requests: ADEQUATE
Special Requests: ADEQUATE

## 2019-07-20 NOTE — Progress Notes (Signed)
MRI lumbar spine shows small disc bulging.  No severe nerve compression.  Return to clinic to repeat exam and discuss next steps and MRI results in detail.

## 2019-07-22 ENCOUNTER — Encounter: Payer: Self-pay | Admitting: Family Medicine

## 2019-07-22 ENCOUNTER — Ambulatory Visit (INDEPENDENT_AMBULATORY_CARE_PROVIDER_SITE_OTHER): Payer: BC Managed Care – PPO | Admitting: Family Medicine

## 2019-07-22 VITALS — Ht 67.0 in

## 2019-07-22 DIAGNOSIS — R29898 Other symptoms and signs involving the musculoskeletal system: Secondary | ICD-10-CM | POA: Diagnosis not present

## 2019-07-22 DIAGNOSIS — R202 Paresthesia of skin: Secondary | ICD-10-CM | POA: Diagnosis not present

## 2019-07-22 MED ORDER — PREDNISONE 10 MG (48) PO TBPK: ORAL_TABLET | Freq: Every day | ORAL | 0 refills | Status: DC

## 2019-07-22 NOTE — Progress Notes (Signed)
Virtual Visit  via Video Note  I connected with      Terri Wilson by a video enabled telemedicine application and verified that I am speaking with the correct person using two identifiers.   I discussed the limitations of evaluation and management by telemedicine and the availability of in person appointments. The patient expressed understanding and agreed to proceed.  History of Present Illness: I, Wendy Poet, LAT, ATC, am serving as scribe for Dr. Lynne Leader.  Terri Wilson is a 34 y.o. female who would like to discuss her L-spine MRI results.  She was last seen by Dr. Georgina Snell on 07/15/19 for severe low back pain w/ radiating pain into her B LEs down to her feet and toes, numbness/tingling in her B LEs and weakness in her B LEs.  She was prescribed a variety of medication including Baclofen, Gabapentin, Zofran, oxycodone-acetaminophen and prednisone and an MRI of her L-spine was ordered.  Since her last visit, pt reports that her low back pain has been much improved until today.  She states that her low back is tightening up again and is having some pain.  She notes that the numbness/tingling and the weakness in her B LEs continues. She con't to take the Gabapentin and Naproxen and intermittently takes the Baclofen.  She was prescribed a course of prednisone as noted above.  She notes with prednisone the paresthesias she was experiencing in her legs improved dramatically. She notes also that she had extensive neuro work-up previously about 5 years ago by neurology.  She reports central nervous system MRI and a search for MS.  She notes this was unrevealing.  She denies ever having a nerve conduction study previously.    Diagnostic imaging: L-spine XR- 07/10/19; CT abdomen/pelvis and CT L-spine- 07/13/19; L-spine MRI- 07/18/19  Observations/Objective: Ht 5\' 7"  (1.702 m)   LMP 07/01/2019 (Exact Date)   BMI 21.77 kg/m  Exam: Appearance Normal Speech.  Normal extremity motion.  Lab  and Radiology Results DG Lumbar Spine Complete  Result Date: 07/10/2019 CLINICAL DATA:  34 year old female with low back pain. EXAM: LUMBAR SPINE - COMPLETE 4+ VIEW COMPARISON:  Lumbar spine radiograph dated 07/23/2014. FINDINGS: Five lumbar type vertebral. There is no acute fracture or subluxation of the lumbar spine. The vertebral body heights and disc spaces are maintained. The visualized posterior elements are intact. There is mild levoscoliosis. An IUD is noted over the pelvis. The soft tissues are unremarkable. IMPRESSION: 1. No acute fracture or subluxation. 2. Mild lumbar levoscoliosis. Electronically Signed   By: Anner Crete M.D.   On: 07/10/2019 19:28   MR Lumbar Spine Wo Contrast  Result Date: 07/18/2019 CLINICAL DATA:  Bilateral leg weakness. Lumbar radiculitis. Rule out cauda carina syndrome EXAM: MRI LUMBAR SPINE WITHOUT CONTRAST TECHNIQUE: Multiplanar, multisequence MR imaging of the lumbar spine was performed. No intravenous contrast was administered. COMPARISON:  CT lumbar spine 07/13/2019 FINDINGS: Segmentation:  Normal Alignment:  Normal Vertebrae:  Normal bone marrow. Negative for fracture or mass. Conus medullaris and cauda equina: Conus extends to the L1-2 level. Conus and cauda equina appear normal. Paraspinal and other soft tissues: Negative for paraspinous mass or adenopathy. Disc levels: L1-2: Negative L2-3: Small left paracentral disc protrusion. Negative for spinal or foraminal stenosis. L3-4: Mild disc degeneration. Small left paracentral disc protrusion without spinal or foraminal stenosis L4-5: Negative L5-S1: Small central disc protrusion. Negative for neural impingement IMPRESSION: Mild lumbar spine degenerative change without stenosis or neural impingement. Small left-sided disc  protrusions L2-3 and L3-4. Small central disc protrusion L5-S1. Electronically Signed   By: Franchot Gallo M.D.   On: 07/18/2019 12:53   CT ABDOMEN PELVIS W CONTRAST  Result Date:  07/13/2019 CLINICAL DATA:  Low back pain radiating to abdomen since Wednesday, UTI EXAM: CT ABDOMEN AND PELVIS WITH CONTRAST TECHNIQUE: Multidetector CT imaging of the abdomen and pelvis was performed using the standard protocol following bolus administration of intravenous contrast. CONTRAST:  124mL OMNIPAQUE IOHEXOL 300 MG/ML  SOLN COMPARISON:  None. FINDINGS: Lower chest: No acute abnormality. Hepatobiliary: No solid liver abnormality is seen. No gallstones, gallbladder wall thickening, or biliary dilatation. Pancreas: Unremarkable. No pancreatic ductal dilatation or surrounding inflammatory changes. Spleen: Normal in size without significant abnormality. Adrenals/Urinary Tract: Adrenal glands are unremarkable. Kidneys are normal, without renal calculi, solid lesion, or hydronephrosis. Bladder is unremarkable. Stomach/Bowel: Stomach is within normal limits. Appendix appears normal (series 5, image 42). No evidence of bowel wall thickening, distention, or inflammatory changes. Vascular/Lymphatic: No significant vascular findings are present. No enlarged abdominal or pelvic lymph nodes. Reproductive: No mass or other significant abnormality. IUD is present in the endometrial cavity. Fluid attenuation cysts or follicles of the ovaries. Other: No abdominal wall hernia or abnormality. No abdominopelvic ascites. Musculoskeletal: No acute or significant osseous findings. IMPRESSION: 1.  No acute CT findings of the abdomen or pelvis to explain pain. 2.  No CT findings of urinary tract infection. Electronically Signed   By: Eddie Candle M.D.   On: 07/13/2019 14:12   CT L-SPINE NO CHARGE  Result Date: 07/13/2019 CLINICAL DATA:  Low back pain radiating to abdomen EXAM: CT LUMBAR SPINE WITH CONTRAST TECHNIQUE: Multidetector CT imaging of the lumbar spine was performed with intravenous contrast administration. CONTRAST:  120mL OMNIPAQUE IOHEXOL 300 MG/ML  SOLN COMPARISON:  None. FINDINGS: Segmentation: 5 lumbar type  vertebrae. Alignment: Normal. Vertebrae: No acute fracture or focal pathologic process. Paraspinal and other soft tissues: Negative. Disc levels: Small, broad-based central posterior disc bulge of L5-S1. Disc spaces are otherwise intact. IMPRESSION: 1.  No acute CT abnormality of the lumbar spine. 2. Small, broad-based central posterior disc bulge of L5-S1. Disc spaces are otherwise intact and vertebral body heights are preserved. Electronically Signed   By: Eddie Candle M.D.   On: 07/13/2019 14:15   I, Lynne Leader, personally (independently) visualized and performed the interpretation of the images attached in this note.    Assessment and Plan: 34 y.o. female with lower extremity weakness and paresthesias.  MRI obtained recently does not explain her current symptoms.  She has small bulging but no significant nerve encroachment or foraminal impingement seen on MR.  I am concerned she has either a central nervous system or peripheral nervous system issue such as MS or myasthenia gravis or condition related to [place].  She does have a significant paresthesia component which makes Guillain-Barr less likely.  Given her prior central nervous system and lack of clonus or hyperreflexia video on prior exam will start looking for peripheral nervous system issue first with nerve conduction study.  Additionally will recheck some rheumatologic labs including TSH sed rate and CK.  Refer to neurology and arrange for nerve conduction study.  Keep me updated with symptoms.  Check back in near future.  Prescribe a slightly longer course of prednisone since she had such good benefit to the short 5-day burst.  PDMP not reviewed this encounter. Orders Placed This Encounter  Procedures  . CK    Standing Status:   Future  Standing Expiration Date:   07/21/2020  . Sedimentation rate    Standing Status:   Future    Standing Expiration Date:   07/21/2020  . TSH    Standing Status:   Future    Standing Expiration  Date:   09/21/2019  . Ambulatory referral to Neurology    Referral Priority:   Routine    Referral Type:   Consultation    Referral Reason:   Specialty Services Required    Requested Specialty:   Neurology    Number of Visits Requested:   1  . NCV with EMG(electromyography)    Standing Status:   Future    Standing Expiration Date:   07/21/2020   Meds ordered this encounter  Medications  . predniSONE (STERAPRED UNI-PAK 48 TAB) 10 MG (48) TBPK tablet    Sig: Take by mouth daily. 12 day dosepack po    Dispense:  48 tablet    Refill:  0    Follow Up Instructions:    I discussed the assessment and treatment plan with the patient. The patient was provided an opportunity to ask questions and all were answered. The patient agreed with the plan and demonstrated an understanding of the instructions.   The patient was advised to call back or seek an in-person evaluation if the symptoms worsen or if the condition fails to improve as anticipated.  Time: 25 minutes of intraservice time, with >39 minutes of total time during today's visit.       Historical information moved to improve visibility of documentation.  Past Medical History:  Diagnosis Date  . Allergic rhinitis   . Anxiety and depression   . Chiari malformation    7 mm  . Depression   . Family history of breast cancer   . Family history of gene mutation   . Family history of melanoma   . Family history of prostate cancer   . Gestational diabetes   . Herpes labialis   . Migraine   . Panic attack    Past Surgical History:  Procedure Laterality Date  . TONSILLECTOMY AND ADENOIDECTOMY  2001  . WISDOM TOOTH EXTRACTION     Social History   Tobacco Use  . Smoking status: Former Smoker    Packs/day: 0.50    Years: 10.00    Pack years: 5.00    Types: Cigarettes    Quit date: 02/19/2019    Years since quitting: 0.4  . Smokeless tobacco: Former Network engineer Use Topics  . Alcohol use: Yes    Alcohol/week: 0.0 standard  drinks    Comment: occ   family history includes Alcohol abuse in her father; Asthma in her mother; Breast cancer in her maternal grandmother; Cancer in her paternal grandfather and sister; Colon cancer in her maternal grandfather; Depression in her father; Heart attack in her father; Heart disease in her paternal aunt and another family member; Heart disease (age of onset: 69) in her father; Hyperlipidemia in an other family member; Hypertension in an other family member; Obesity in an other family member; Von Willebrand disease in her sister.  Medications: Current Outpatient Medications  Medication Sig Dispense Refill  . gabapentin (NEURONTIN) 300 MG capsule One tab PO qHS for a week, then BID for a week, then TID. May double weekly to a max of 3,600mg /day for nerve pain 180 capsule 3  . ibuprofen (ADVIL) 200 MG tablet Take 800 mg by mouth every 6 (six) hours as needed for mild pain.    Marland Kitchen  lidocaine (LIDODERM) 5 % Place 1 patch onto the skin daily. Remove & Discard patch within 12 hours or as directed by MD 5 patch 0  . Multiple Vitamins-Minerals (MULTIVITAMIN WOMEN) TABS Take 1 tablet by mouth daily.    . naproxen (NAPROSYN) 500 MG tablet Take 1 tablet (500 mg total) by mouth 2 (two) times daily. 30 tablet 0  . naproxen (NAPROSYN) 500 MG tablet Take 1 tablet (500 mg total) by mouth 2 (two) times daily. 30 tablet 0  . ondansetron (ZOFRAN ODT) 4 MG disintegrating tablet Take 1 tablet (4 mg total) by mouth every 8 (eight) hours as needed for nausea or vomiting. 20 tablet 0  . predniSONE (STERAPRED UNI-PAK 48 TAB) 10 MG (48) TBPK tablet Take by mouth daily. 12 day dosepack po 48 tablet 0   No current facility-administered medications for this visit.   Allergies  Allergen Reactions  . Lamictal [Lamotrigine] Anaphylaxis  . Wellbutrin [Bupropion] Other (See Comments)    Increased anxiety, weight loss, OCD  . Vancomycin Other (See Comments)    Feels hot

## 2019-07-23 ENCOUNTER — Encounter: Payer: Self-pay | Admitting: Neurology

## 2019-07-24 ENCOUNTER — Telehealth: Payer: Self-pay | Admitting: Physician Assistant

## 2019-07-24 ENCOUNTER — Encounter: Payer: Self-pay | Admitting: Physician Assistant

## 2019-07-24 ENCOUNTER — Ambulatory Visit (INDEPENDENT_AMBULATORY_CARE_PROVIDER_SITE_OTHER): Payer: BC Managed Care – PPO | Admitting: Physician Assistant

## 2019-07-24 DIAGNOSIS — F419 Anxiety disorder, unspecified: Secondary | ICD-10-CM | POA: Diagnosis not present

## 2019-07-24 DIAGNOSIS — Z01419 Encounter for gynecological examination (general) (routine) without abnormal findings: Secondary | ICD-10-CM | POA: Diagnosis not present

## 2019-07-24 DIAGNOSIS — F329 Major depressive disorder, single episode, unspecified: Secondary | ICD-10-CM | POA: Diagnosis not present

## 2019-07-24 DIAGNOSIS — F32A Depression, unspecified: Secondary | ICD-10-CM

## 2019-07-24 DIAGNOSIS — Z6821 Body mass index (BMI) 21.0-21.9, adult: Secondary | ICD-10-CM | POA: Diagnosis not present

## 2019-07-24 MED ORDER — CITALOPRAM HYDROBROMIDE 20 MG PO TABS: 20.0000 mg | ORAL_TABLET | Freq: Every day | ORAL | 0 refills | Status: DC

## 2019-07-24 MED ORDER — ALPRAZOLAM 0.5 MG PO TABS: 0.5000 mg | ORAL_TABLET | Freq: Every evening | ORAL | 0 refills | Status: DC | PRN

## 2019-07-24 NOTE — Telephone Encounter (Signed)
Please schedule in 12:00 spot

## 2019-07-24 NOTE — Telephone Encounter (Signed)
Schedule ASAP.

## 2019-07-24 NOTE — Telephone Encounter (Signed)
Patient is scheduled virtually at 12 pm today.

## 2019-07-24 NOTE — Telephone Encounter (Signed)
Pt states she needs refill of Xanax and new RX for antidepressant. Unable to wait until 3/22 appt.

## 2019-07-24 NOTE — Telephone Encounter (Signed)
Please advise if pt can be seen today in a virtual slot? TOC schedule for 3/22.

## 2019-07-24 NOTE — Progress Notes (Signed)
Virtual Visit via Video   I connected with Terri Wilson on 07/24/19 at 12:00 PM EST by a video enabled telemedicine application and verified that I am speaking with the correct person using two identifiers. Location patient: Home Location provider: [facility], Office Persons participating in the virtual visit: [place], Glazewski PA-C,Donna Orphanos, LPN   I discussed the limitations of evaluation and management by telemedicine and the availability of in person appointments. The patient expressed understanding and agreed to proceed.  I acted as a Education administrator for [employer], PA-C [employer], LPN  Subjective:   HPI:   Anxiety & Depression Pt following up.  She reports recent increase of anxiety and depression.  She states that her father's anniversary of suicide is this weekend and always has difficulty this time of year.  Overall her back pain has improved but she is feeling overall overwhelmed.  Prior she was on Wellbutrin but this caused her to have increased anxiety.  She was also on Celexa most recently and overall did okay with this but took herself off because she did not have a PCP anymore.  She has tried several medications to help with sleeping and panic attacks and Xanax is the only medication that works for her.  She is currently taking  Tylenol p.m. nightly without any relief.  Has a good support system.  She denies current suicidal ideation.  Depression screen [facility] 2/9 07/24/2019 01/02/2019 11/19/2018 06/24/2018 08/08/2016  Decreased Interest 3 0 0 1 3  Down, Depressed, Hopeless 3 0 0 1 2  PHQ - 2 Score 6 0 0 2 5  Altered sleeping 3 0 3 1 3   Tired, decreased energy 3 3 3 3 3   Change in appetite 3 3 3 3 3   Feeling bad or failure about yourself  3 0 3 1 3   Trouble concentrating 0 0 0 1 3  Moving slowly or fidgety/restless 0 0 0 3 3  Suicidal thoughts 0 0 0 1 (No Data)  PHQ-9 Score 18 6 12 15 23   Difficult doing work/chores Very difficult Extremely  dIfficult Extremely dIfficult Extremely dIfficult -   GAD 7 : Generalized Anxiety Score 07/24/2019  Nervous, Anxious, on Edge 3  Control/stop worrying 3  Worry too much - different things 3  Trouble relaxing 3  Restless 0  Easily annoyed or irritable 3  Afraid - awful might happen 0  Total GAD 7 Score 15  Anxiety Difficulty Very difficult      ROS: See pertinent positives and negatives per HPI.  Patient Active Problem List   Diagnosis Date Noted  . Genetic testing 11/12/2018  . Family history of breast cancer   . Family history of prostate cancer   . Family history of melanoma   . GAD (generalized anxiety disorder) 09/04/2018  . Primary insomnia 09/04/2018  . Recurrent major depressive disorder, in partial remission ([place]) 09/04/2018  . Severe recurrent major depression ([place]) 07/04/2018  . Family history of von Willebrand disease 06/24/2018  . History of gestational diabetes 06/24/2018  . PVC (premature ventricular contraction) 09/21/2015  . Pulmonary nodule 12/13/2014  . Cigarette smoker 12/04/2014  . ADD (attention deficit disorder) 03/17/2014  . Migraine 04/25/2012  . AR (allergic rhinitis) 04/25/2012  . Anxiety and depression   . Panic attack     Social History   Tobacco Use  . Smoking status: Former Smoker    Packs/day: 0.50    Years: 10.00    Pack years: 5.00  Types: Cigarettes    Quit date: 02/19/2019    Years since quitting: 0.4  . Smokeless tobacco: Former Network engineer Use Topics  . Alcohol use: Yes    Alcohol/week: 0.0 standard drinks    Comment: occ    Current Outpatient Medications:  .  gabapentin (NEURONTIN) 300 MG capsule, One tab PO qHS for a week, then BID for a week, then TID. May double weekly to a max of 3,600mg /day for nerve pain, Disp: 180 capsule, Rfl: 3 .  ibuprofen (ADVIL) 200 MG tablet, Take 800 mg by mouth every 6 (six) hours as needed for mild pain., Disp: , Rfl:  .  lidocaine (LIDODERM) 5 %, Place 1 patch onto the skin daily.  Remove & Discard patch within 12 hours or as directed by MD, Disp: 5 patch, Rfl: 0 .  Multiple Vitamins-Minerals (MULTIVITAMIN WOMEN) TABS, Take 1 tablet by mouth daily., Disp: , Rfl:  .  naproxen (NAPROSYN) 500 MG tablet, Take 1 tablet (500 mg total) by mouth 2 (two) times daily., Disp: 30 tablet, Rfl: 0 .  ondansetron (ZOFRAN ODT) 4 MG disintegrating tablet, Take 1 tablet (4 mg total) by mouth every 8 (eight) hours as needed for nausea or vomiting., Disp: 20 tablet, Rfl: 0 .  ALPRAZolam (XANAX) 0.5 MG tablet, Take 1 tablet (0.5 mg total) by mouth at bedtime as needed for anxiety., Disp: 30 tablet, Rfl: 0 .  citalopram (CELEXA) 20 MG tablet, Take 1 tablet (20 mg total) by mouth daily., Disp: 30 tablet, Rfl: 0  Allergies  Allergen Reactions  . Lamictal [Lamotrigine] Anaphylaxis  . Wellbutrin [Bupropion] Other (See Comments)    Increased anxiety, weight loss, OCD  . Vancomycin Other (See Comments)    Feels hot    Objective:   VITALS: Per patient if applicable, see vitals. GENERAL: Alert, appears well and in no acute distress. HEENT: Atraumatic, conjunctiva clear, no obvious abnormalities on inspection of external nose and ears. NECK: Normal movements of the head and neck. CARDIOPULMONARY: No increased WOB. Speaking in clear sentences. I:E ratio WNL.  MS: Moves all visible extremities without noticeable abnormality. PSYCH: Pleasant and cooperative, well-groomed. Speech normal rate and rhythm. Affect is appropriate. Insight and judgement are appropriate. Attention is focused, linear, and appropriate.  NEURO: CN grossly intact. Oriented as arrived to appointment on time with no prompting. Moves both UE equally.  SKIN: No obvious lesions, wounds, erythema, or cyanosis noted on face or hands.  Assessment and Plan:   Terri Wilson was seen today for anxiety and depression.  Diagnoses and all orders for this visit:  Anxiety and depression  Other orders -     citalopram (CELEXA) 20 MG tablet;  Take 1 tablet (20 mg total) by mouth daily. -     ALPRAZolam (XANAX) 0.5 MG tablet; Take 1 tablet (0.5 mg total) by mouth at bedtime as needed for anxiety.   Uncontrolled.  We will restart her Celexa 20 mg daily, follow-up in 1 month.  Have also given her Xanax to use as needed for insomnia and anxiety.  Asked her to reach out to me if she is having worsening thoughts.  I have also asked her to tell someone that she trusts that she is starting medication and to reach out if she experiences any mood changes.  I discussed with patient that if they develop any SI, to tell someone immediately and seek medical attention.  . Reviewed expectations re: course of current medical issues. . Discussed self-management of symptoms. . Outlined signs  and symptoms indicating need for more acute intervention. . Patient verbalized understanding and all questions were answered. Marland Kitchen Health Maintenance issues including appropriate healthy diet, exercise, and smoking avoidance were discussed with patient. . See orders for this visit as documented in the electronic medical record.  I discussed the assessment and treatment plan with the patient. The patient was provided an opportunity to ask questions and all were answered. The patient agreed with the plan and demonstrated an understanding of the instructions.   The patient was advised to call back or seek an in-person evaluation if the symptoms worsen or if the condition fails to improve as anticipated.   CMA or LPN served as scribe during this visit. History, Physical, and Plan performed by medical provider. The above documentation has been reviewed and is accurate and complete.   [place], [place] 07/24/2019

## 2019-07-30 ENCOUNTER — Ambulatory Visit: Payer: BC Managed Care – PPO | Admitting: Physical Therapy

## 2019-08-10 ENCOUNTER — Ambulatory Visit (INDEPENDENT_AMBULATORY_CARE_PROVIDER_SITE_OTHER): Payer: BC Managed Care – PPO | Admitting: Physician Assistant

## 2019-08-10 ENCOUNTER — Encounter: Payer: Self-pay | Admitting: Physician Assistant

## 2019-08-10 DIAGNOSIS — F419 Anxiety disorder, unspecified: Secondary | ICD-10-CM

## 2019-08-10 DIAGNOSIS — F32A Depression, unspecified: Secondary | ICD-10-CM

## 2019-08-10 DIAGNOSIS — F329 Major depressive disorder, single episode, unspecified: Secondary | ICD-10-CM

## 2019-08-10 MED ORDER — CITALOPRAM HYDROBROMIDE 20 MG PO TABS: 20.0000 mg | ORAL_TABLET | Freq: Every day | ORAL | 0 refills | Status: DC

## 2019-08-10 MED ORDER — ALPRAZOLAM 0.5 MG PO TABS: 0.5000 mg | ORAL_TABLET | Freq: Every evening | ORAL | 2 refills | Status: DC | PRN

## 2019-08-10 NOTE — Progress Notes (Signed)
TELEPHONE ENCOUNTER   Patient verbally agreed to telephone visit and is aware that copayment and coinsurance may apply. Patient was treated using telemedicine according to accepted telemedicine protocols.  Location of the patient: Home Location of provider: [facility] office Names of all persons participating in the telemedicine service and role in the encounter: Terri Coke, PA , Chrisandra Netters  Subjective:   Chief Complaint  Patient presents with  . Transfer of care  . Anxiety  . Depression     HPI    Anxiety & Depression Pt following up, was restarted on Celexa 20 mg last visit 03/05 and using Xanax 0.5 mg at bedtime prn. Complaint with medication. Pt says she is feeling better. Denies SI/HI.   Would like to continue her Celexa 20 mg daily. She has been on Celexa 40 mg in the past but feels stable at 20 mg dosage.  Recently got a lot of help, through friends, her midwife, and spiritually. Has felt like anxiety and depression has been significantly improved since she last saw me.    Patient Active Problem List   Diagnosis Date Noted  . Genetic testing 11/12/2018  . Family history of breast cancer   . Family history of prostate cancer   . Family history of melanoma   . GAD (generalized anxiety disorder) 09/04/2018  . Primary insomnia 09/04/2018  . Recurrent major depressive disorder, in partial remission ([place]) 09/04/2018  . Severe recurrent major depression ([place]) 07/04/2018  . Family history of von Willebrand disease 06/24/2018  . History of gestational diabetes 06/24/2018  . PVC (premature ventricular contraction) 09/21/2015  . Pulmonary nodule 12/13/2014  . Cigarette smoker 12/04/2014  . ADD (attention deficit disorder) 03/17/2014  . Migraine 04/25/2012  . AR (allergic rhinitis) 04/25/2012  . Anxiety and depression   . Panic attack    Social History   Tobacco Use  . Smoking status: Former Smoker    Packs/day: 0.50    Years: 10.00    Pack years: 5.00   Types: Cigarettes    Quit date: 02/19/2019    Years since quitting: 0.4  . Smokeless tobacco: Former Network engineer Use Topics  . Alcohol use: Yes    Alcohol/week: 0.0 standard drinks    Comment: occ    Current Outpatient Medications:  .  ALPRAZolam (XANAX) 0.5 MG tablet, Take 1 tablet (0.5 mg total) by mouth at bedtime as needed for anxiety., Disp: 30 tablet, Rfl: 2 .  citalopram (CELEXA) 20 MG tablet, Take 1 tablet (20 mg total) by mouth daily., Disp: 90 tablet, Rfl: 0 .  ibuprofen (ADVIL) 200 MG tablet, Take 800 mg by mouth every 6 (six) hours as needed for mild pain., Disp: , Rfl:  Allergies  Allergen Reactions  . Lamictal [Lamotrigine] Anaphylaxis  . Wellbutrin [Bupropion] Other (See Comments)    Increased anxiety, weight loss, OCD  . Vancomycin Other (See Comments)    Feels hot    Assessment & Plan:   1. Anxiety and depression   Improved.  Continue Celexa 20 mg daily and Xanax 0.5 mg as needed.  Discussed continuing to reach out to her support group as needed to help with her mood.  Follow-up in 3 months, sooner if concerns.  No orders of the defined types were placed in this encounter.  Meds ordered this encounter  Medications  . citalopram (CELEXA) 20 MG tablet    Sig: Take 1 tablet (20 mg total) by mouth daily.    Dispense:  90 tablet  Refill:  0    Order Specific Question:   Supervising Provider    Answer:   Vivi Barrack [id number]  . ALPRAZolam (XANAX) 0.5 MG tablet    Sig: Take 1 tablet (0.5 mg total) by mouth at bedtime as needed for anxiety.    Dispense:  30 tablet    Refill:  2    Order Specific Question:   Supervising Provider    Answer:   Maryruth Eve    Terri Coke, PA 08/10/2019  Time spent with the patient: 8 minutes, spent in obtaining information about her symptoms, reviewing her previous labs, evaluations, and treatments, counseling her about her condition (please see the discussed topics above), and developing a plan to further  investigate it; she had a number of questions which I addressed.

## 2019-09-11 ENCOUNTER — Ambulatory Visit: Payer: BC Managed Care – PPO | Admitting: Neurology

## 2019-09-17 ENCOUNTER — Other Ambulatory Visit: Payer: Self-pay | Admitting: Physician Assistant

## 2019-09-17 DIAGNOSIS — Z1231 Encounter for screening mammogram for malignant neoplasm of breast: Secondary | ICD-10-CM

## 2019-10-15 ENCOUNTER — Other Ambulatory Visit: Payer: Self-pay

## 2019-10-15 ENCOUNTER — Ambulatory Visit
Admission: RE | Admit: 2019-10-15 | Discharge: 2019-10-15 | Disposition: A | Discharge status: Home / Self Care | Payer: BC Managed Care – PPO | Source: Ambulatory Visit | Attending: Physician Assistant | Admitting: Physician Assistant

## 2019-10-15 DIAGNOSIS — Z1231 Encounter for screening mammogram for malignant neoplasm of breast: Secondary | ICD-10-CM | POA: Diagnosis not present

## 2020-01-18 ENCOUNTER — Telehealth: Payer: Self-pay

## 2020-01-18 NOTE — Telephone Encounter (Signed)
Called pt to check up on her and her family, following last weeks events with her husband and covid. Pt and I had talked several times last week and I told her I would check back in . She states her husband is doing better and is on oxygen. I asked Terri Wilson how she was doing after going through so much, as when she called the office last week she was crying and extremely overwhelmed during our conversation. Pt stated that she is doing okay, but did have a " relapse" yesterday. I did not ask what that meant, but just wanted to note it. She states she is better today though. Pt asked to schedule a med refill appt, which we did. Pt states she is thankful for all the help we offered. I reassured pt to call if they need anything at all.

## 2020-01-18 NOTE — Telephone Encounter (Signed)
Noted  

## 2020-01-19 ENCOUNTER — Telehealth (INDEPENDENT_AMBULATORY_CARE_PROVIDER_SITE_OTHER): Payer: BC Managed Care – PPO | Admitting: Physician Assistant

## 2020-01-19 ENCOUNTER — Encounter: Payer: Self-pay | Admitting: Physician Assistant

## 2020-01-19 ENCOUNTER — Other Ambulatory Visit: Payer: Self-pay

## 2020-01-19 DIAGNOSIS — F32A Depression, unspecified: Secondary | ICD-10-CM

## 2020-01-19 DIAGNOSIS — F329 Major depressive disorder, single episode, unspecified: Secondary | ICD-10-CM | POA: Diagnosis not present

## 2020-01-19 DIAGNOSIS — F419 Anxiety disorder, unspecified: Secondary | ICD-10-CM

## 2020-01-19 MED ORDER — CITALOPRAM HYDROBROMIDE 20 MG PO TABS: 20.0000 mg | ORAL_TABLET | Freq: Every day | ORAL | 3 refills | Status: DC

## 2020-01-19 MED ORDER — ALPRAZOLAM 0.5 MG PO TABS: 0.5000 mg | ORAL_TABLET | Freq: Every evening | ORAL | 2 refills | Status: AC | PRN

## 2020-01-19 NOTE — Progress Notes (Signed)
Virtual Visit via Video   I connected with Terri Wilson on 01/19/20 at 12:30 PM EDT by a video enabled telemedicine application and verified that I am speaking with the correct person using two identifiers. Location patient: Home Location provider: [facility], Office Persons participating in the virtual visit: Rakia, Frayne PA-C, Anselmo Pickler, LPN   I discussed the limitations of evaluation and management by telemedicine and the availability of in person appointments. The patient expressed understanding and agreed to proceed.  I acted as a Education administrator for [employer], [employer], LPN   Subjective:    HPI:  Anxiety/Depression Pt is following up today, needs refills on Alprazolam and Celexa. She is overall doing well with these medications. Denies SI/HI. She recently had COVID and has recovered well.   Has been out of her xanax for a few months. She reports that she doesn't take this daily, but does take when having panic attacks.  ROS: See pertinent positives and negatives per HPI.  Patient Active Problem List   Diagnosis Date Noted  . Genetic testing 11/12/2018  . Family history of breast cancer   . Family history of prostate cancer   . Family history of melanoma   . GAD (generalized anxiety disorder) 09/04/2018  . Primary insomnia 09/04/2018  . Recurrent major depressive disorder, in partial remission ([place]) 09/04/2018  . Severe recurrent major depression ([place]) 07/04/2018  . Family history of von Willebrand disease 06/24/2018  . History of gestational diabetes 06/24/2018  . PVC (premature ventricular contraction) 09/21/2015  . Pulmonary nodule 12/13/2014  . Cigarette smoker 12/04/2014  . ADD (attention deficit disorder) 03/17/2014  . Migraine 04/25/2012  . AR (allergic rhinitis) 04/25/2012  . Anxiety and depression   . Panic attack     Social History   Tobacco Use  . Smoking status: Former Smoker    Packs/day: 0.50    Years:  10.00    Pack years: 5.00    Types: Cigarettes    Quit date: 02/19/2019    Years since quitting: 0.9  . Smokeless tobacco: Former Network engineer Use Topics  . Alcohol use: Yes    Alcohol/week: 0.0 standard drinks    Comment: occ    Current Outpatient Medications:  .  ALPRAZolam (XANAX) 0.5 MG tablet, Take 1 tablet (0.5 mg total) by mouth at bedtime as needed for anxiety., Disp: 30 tablet, Rfl: 2 .  citalopram (CELEXA) 20 MG tablet, Take 1 tablet (20 mg total) by mouth daily., Disp: 90 tablet, Rfl: 3 .  ibuprofen (ADVIL) 200 MG tablet, Take 800 mg by mouth every 6 (six) hours as needed for mild pain., Disp: , Rfl:   Allergies  Allergen Reactions  . Lamictal [Lamotrigine] Anaphylaxis  . Wellbutrin [Bupropion] Other (See Comments)    Increased anxiety, weight loss, OCD  . Vancomycin Other (See Comments)    Feels hot    Objective:   VITALS: Per patient if applicable, see vitals. GENERAL: Alert, appears well and in no acute distress. HEENT: Atraumatic, conjunctiva clear, no obvious abnormalities on inspection of external nose and ears. NECK: Normal movements of the head and neck. CARDIOPULMONARY: No increased WOB. Speaking in clear sentences. I:E ratio WNL.  MS: Moves all visible extremities without noticeable abnormality. PSYCH: Pleasant and cooperative, well-groomed. Speech normal rate and rhythm. Affect is appropriate. Insight and judgement are appropriate. Attention is focused, linear, and appropriate.  NEURO: CN grossly intact. Oriented as arrived to appointment on time with no prompting.  Moves both UE equally.  SKIN: No obvious lesions, wounds, erythema, or cyanosis noted on face or hands.  Assessment and Plan:   Lissett was seen today for anxiety and depression.  Diagnoses and all orders for this visit:  Anxiety and depression  Other orders -     ALPRAZolam (XANAX) 0.5 MG tablet; Take 1 tablet (0.5 mg total) by mouth at bedtime as needed for anxiety. -     citalopram  (CELEXA) 20 MG tablet; Take 1 tablet (20 mg total) by mouth daily.   Denies SI/HI. Refill celexa 20 mg daily and xanax. PDMP reviewed today. Follow-up in 6 months, sooner if concerns.  . Reviewed expectations re: course of current medical issues. . Discussed self-management of symptoms. . Outlined signs and symptoms indicating need for more acute intervention. . Patient verbalized understanding and all questions were answered. Marland Kitchen Health Maintenance issues including appropriate healthy diet, exercise, and smoking avoidance were discussed with patient. . See orders for this visit as documented in the electronic medical record.  I discussed the assessment and treatment plan with the patient. The patient was provided an opportunity to ask questions and all were answered. The patient agreed with the plan and demonstrated an understanding of the instructions.   The patient was advised to call back or seek an in-person evaluation if the symptoms worsen or if the condition fails to improve as anticipated.   CMA or LPN served as scribe during this visit. History, Physical, and Plan performed by medical provider. The above documentation has been reviewed and is accurate and complete.   [place], [place] 01/19/2020

## 2020-03-24 DIAGNOSIS — R634 Abnormal weight loss: Secondary | ICD-10-CM | POA: Diagnosis not present

## 2020-03-24 DIAGNOSIS — R350 Frequency of micturition: Secondary | ICD-10-CM | POA: Diagnosis not present

## 2020-03-24 DIAGNOSIS — Z30431 Encounter for routine checking of intrauterine contraceptive device: Secondary | ICD-10-CM | POA: Diagnosis not present

## 2020-03-24 DIAGNOSIS — N941 Unspecified dyspareunia: Secondary | ICD-10-CM | POA: Diagnosis not present

## 2020-03-24 DIAGNOSIS — K30 Functional dyspepsia: Secondary | ICD-10-CM | POA: Diagnosis not present

## 2020-05-09 ENCOUNTER — Other Ambulatory Visit: Payer: Self-pay | Admitting: Physician Assistant

## 2020-05-23 ENCOUNTER — Other Ambulatory Visit: Payer: Self-pay | Admitting: Physician Assistant

## 2020-12-23 DIAGNOSIS — F332 Major depressive disorder, recurrent severe without psychotic features: Secondary | ICD-10-CM | POA: Diagnosis not present

## 2020-12-23 DIAGNOSIS — F902 Attention-deficit hyperactivity disorder, combined type: Secondary | ICD-10-CM | POA: Diagnosis not present

## 2020-12-23 DIAGNOSIS — Z803 Family history of malignant neoplasm of breast: Secondary | ICD-10-CM | POA: Diagnosis not present

## 2020-12-23 DIAGNOSIS — F411 Generalized anxiety disorder: Secondary | ICD-10-CM | POA: Diagnosis not present

## 2021-01-15 IMAGING — CT CT L SPINE W/O CM
3 series · 11 of 33 positions shown, 13 images · IV contrast (Omnipaque)
Comparison: None.

CLINICAL DATA: Low back pain radiating to abdomen

EXAM:
CT LUMBAR SPINE WITH CONTRAST
TECHNIQUE: Multidetector CT imaging of the lumbar spine was performed with
intravenous contrast administration.
CONTRAST:  100mL OMNIPAQUE IOHEXOL 300 MG/ML  SOLN

[Series 7: axial bone · axial · 0.35mm/px · z∈[-182,-24]mm · 3 of 129 slices shown, 4 images]
[im 30/129  soft-tissue]
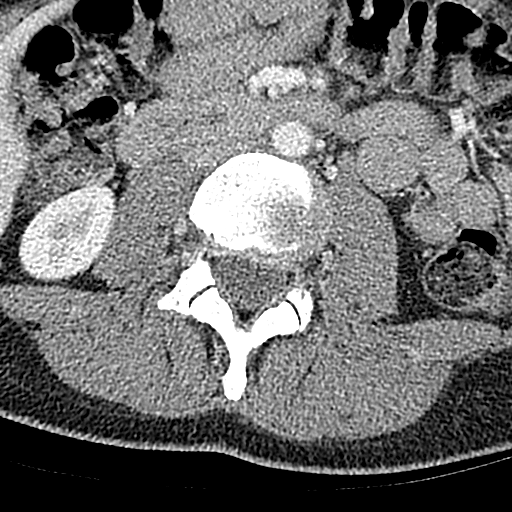
[im 30/129  bone]
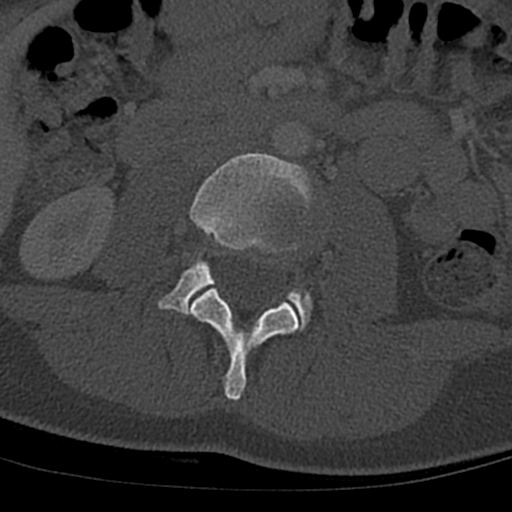
[im 69/129  bone]
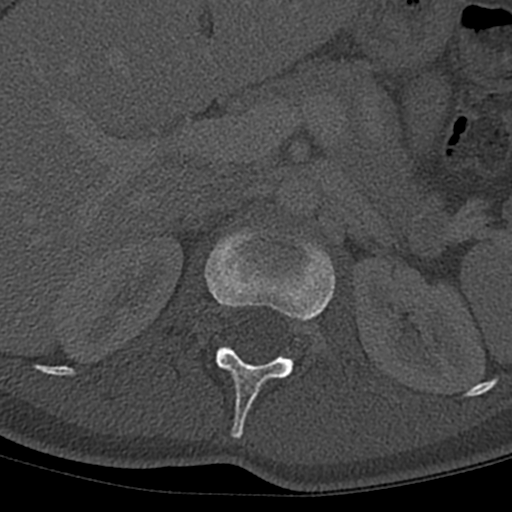
[im 109/129  bone]
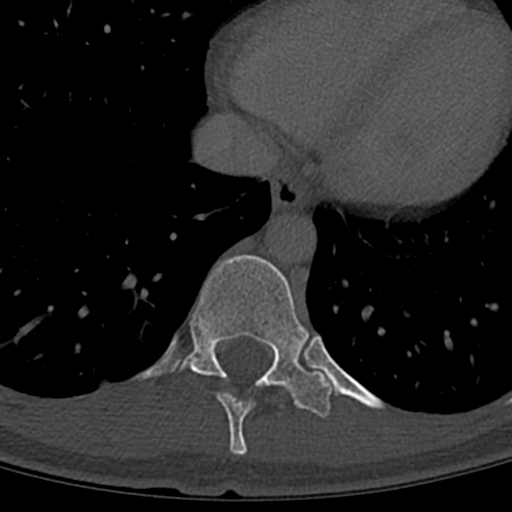

[Series 8: coronal bone · coronal · 0.47mm/px · 3 of 74 slices shown]
[im 15/74  bone]
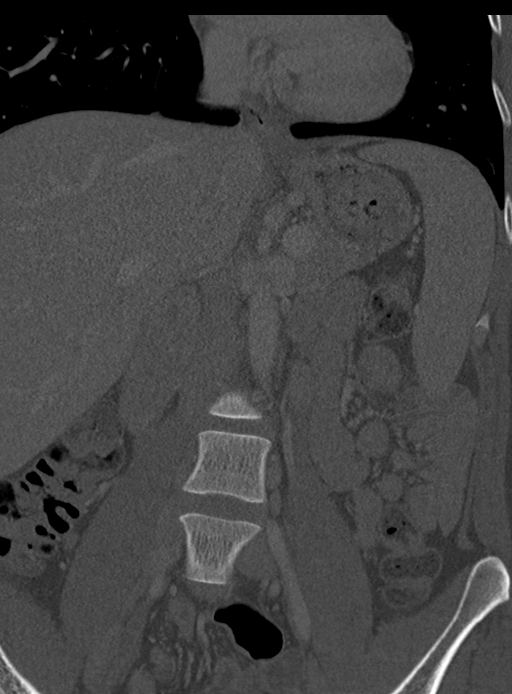
[im 30/74  bone]
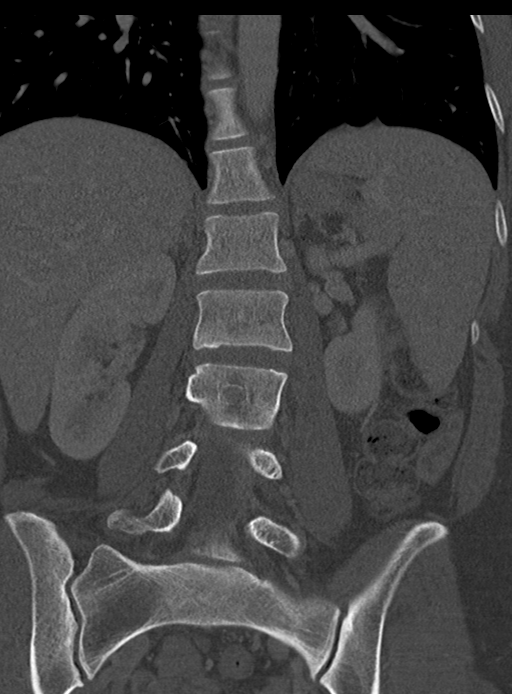
[im 44/74  bone]
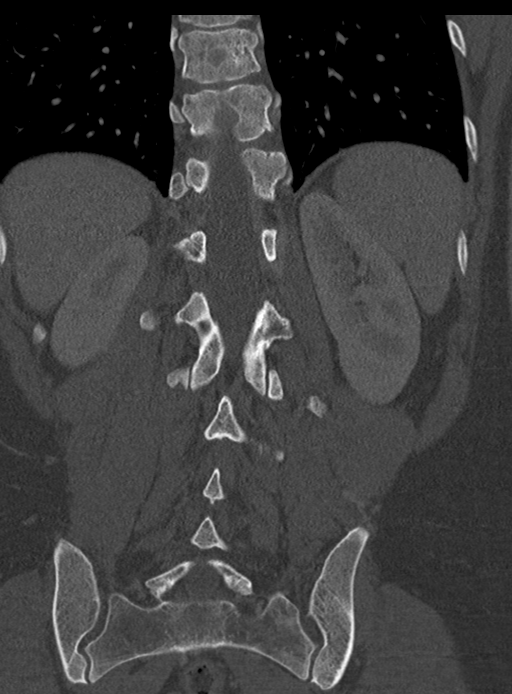

[Series 9: sagittal bone · sagittal · 0.32mm/px · 5 of 82 slices shown, 6 images]
[im 28/82  bone]
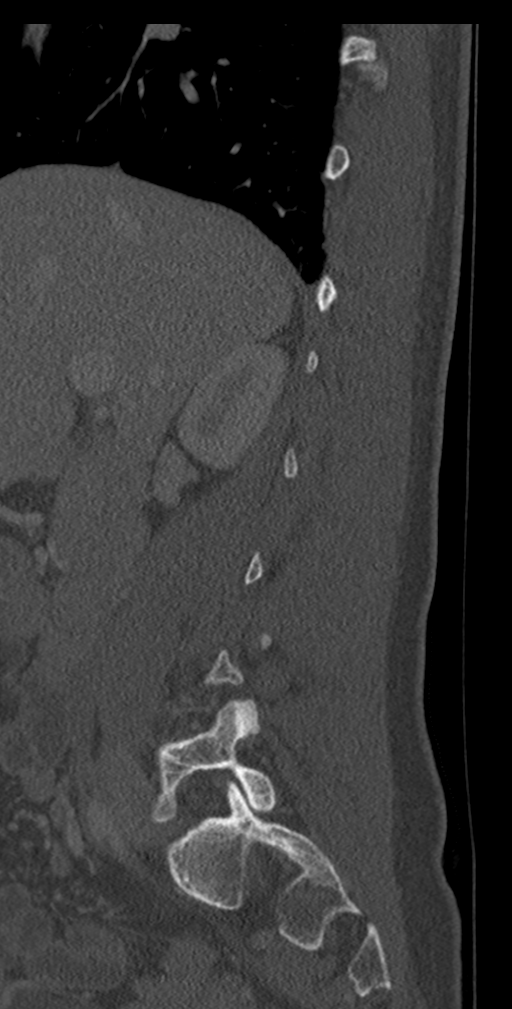
[im 34/82  bone]
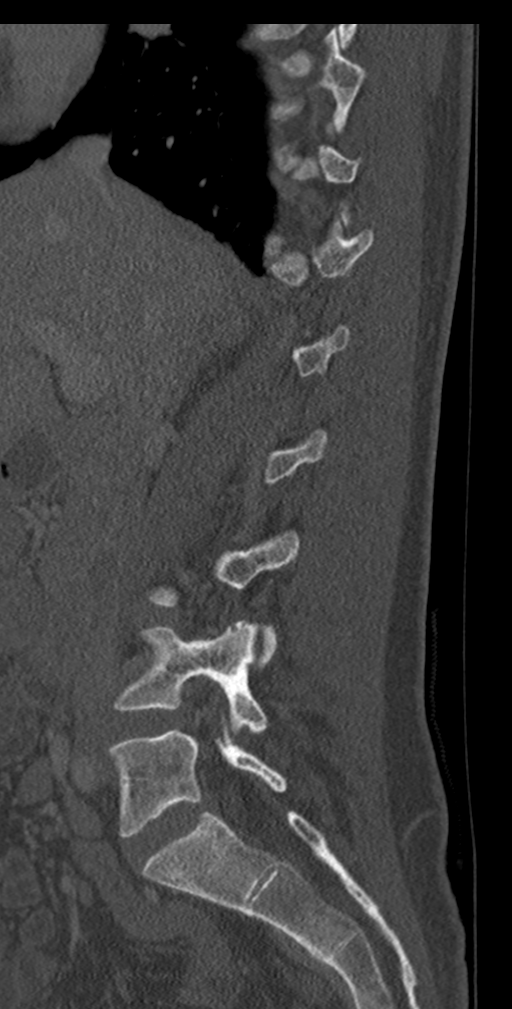
[im 41/82  soft-tissue]
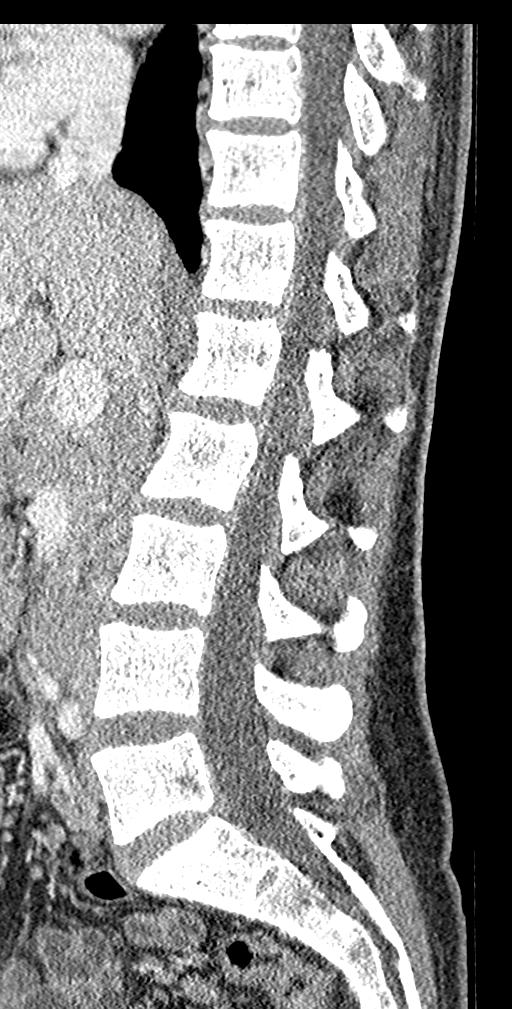
[im 41/82  bone]
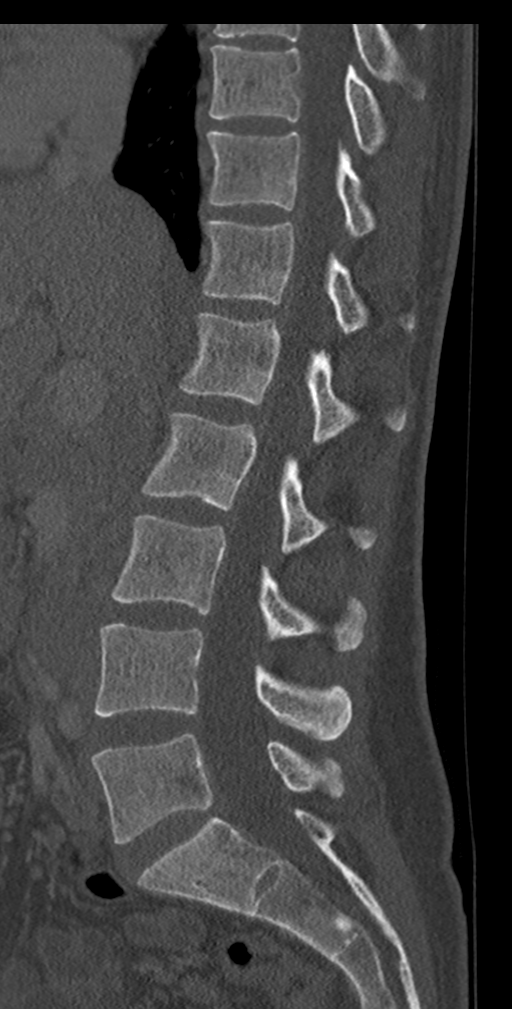
[im 48/82  bone]
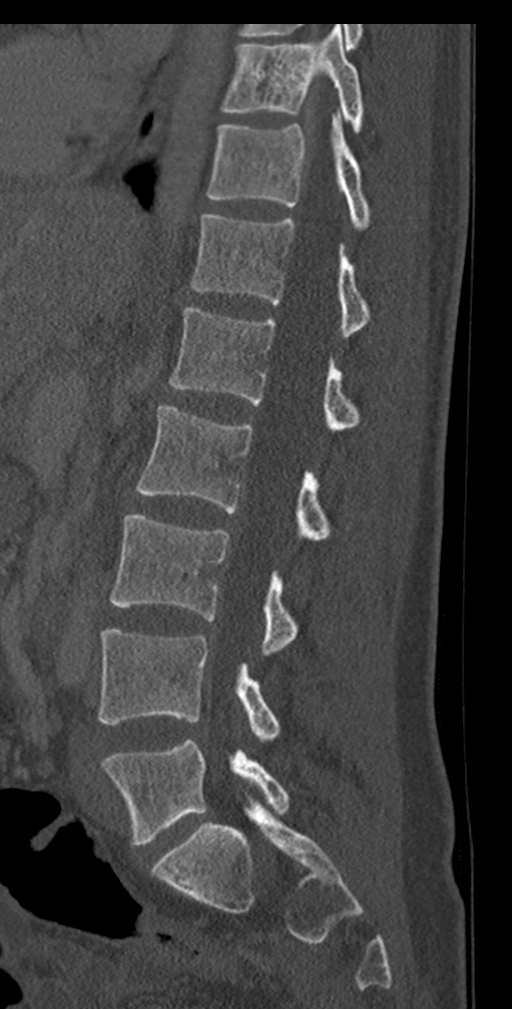
[im 55/82  bone]
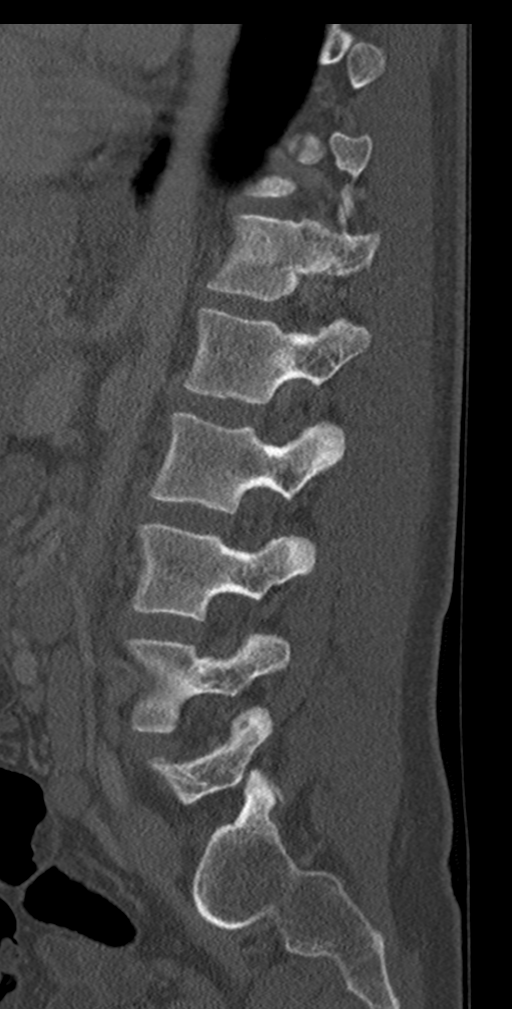

[11 of 33 positions shown; findings below may reference images not displayed]

FINDINGS: Segmentation: 5 lumbar type vertebrae.

Alignment: Normal.

Vertebrae: No acute fracture or focal pathologic process.

Paraspinal and other soft tissues: Negative.

Disc levels: Small, broad-based central posterior disc bulge of
L5-S1. Disc spaces are otherwise intact.
IMPRESSION: 1.  No acute CT abnormality of the lumbar spine.

2. Small, broad-based central posterior disc bulge of L5-S1. Disc
spaces are otherwise intact and vertebral body heights are
preserved.

## 2021-01-20 IMAGING — MR MR LUMBAR SPINE W/O CM
4 series · 29 of 48 positions shown · non-contrast
Comparison: CT lumbar spine 07/13/2019

CLINICAL DATA: Bilateral leg weakness. Lumbar radiculitis. Rule out
cauda carina syndrome

EXAM:
MRI LUMBAR SPINE WITHOUT CONTRAST
TECHNIQUE: Multiplanar, multisequence MR imaging of the lumbar spine was
performed. No intravenous contrast was administered.

[Series 3: T2 · sagittal · 4.0mm · 0.81mm/px · 9 of 15 slices shown (1 of 2)]
[im 1/15]
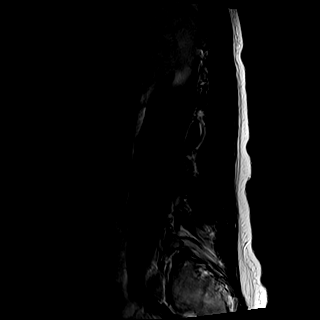
[im 2/15]
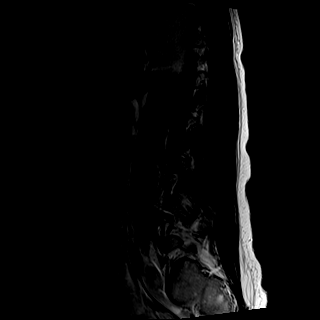
[im 4/15]
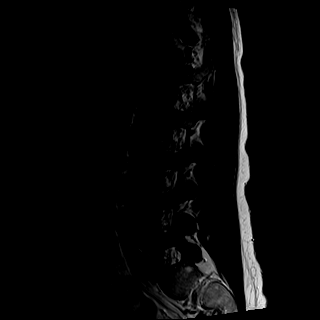
[im 6/15]
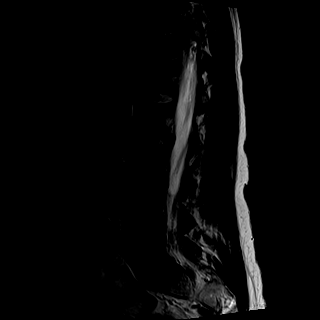
[im 8/15]
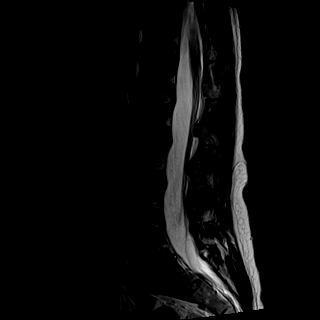
[im 9/15]
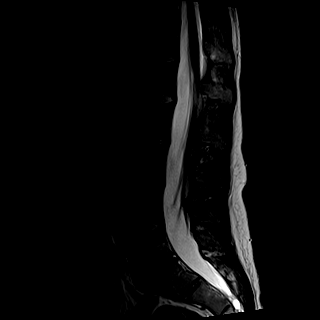
[im 11/15]
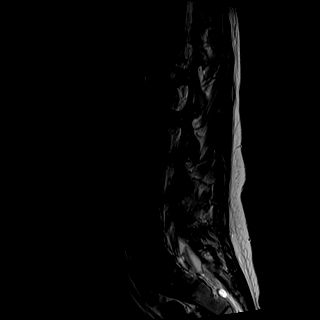
[im 13/15]
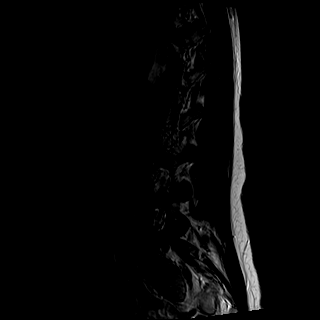
[im 15/15]
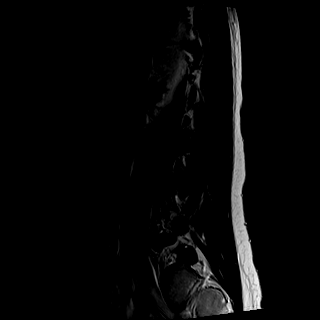

[Series 4: T1 · sagittal · 4.0mm · 0.41mm/px · 6 of 15 slices shown]
[im 1/15]
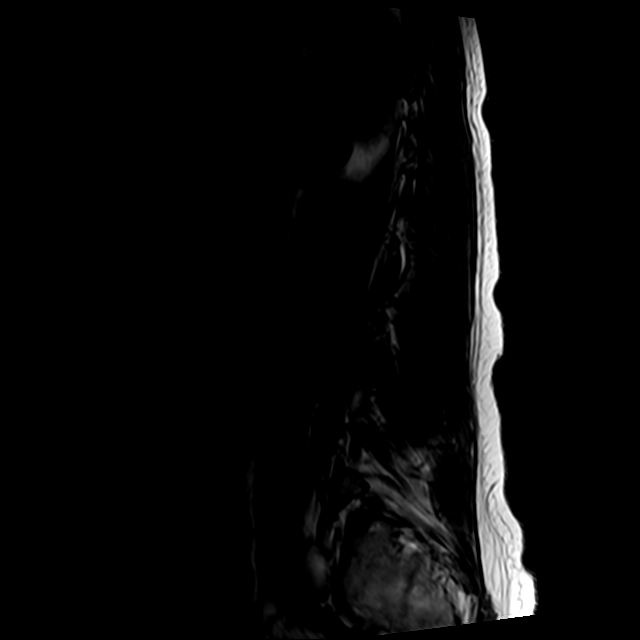
[im 3/15]
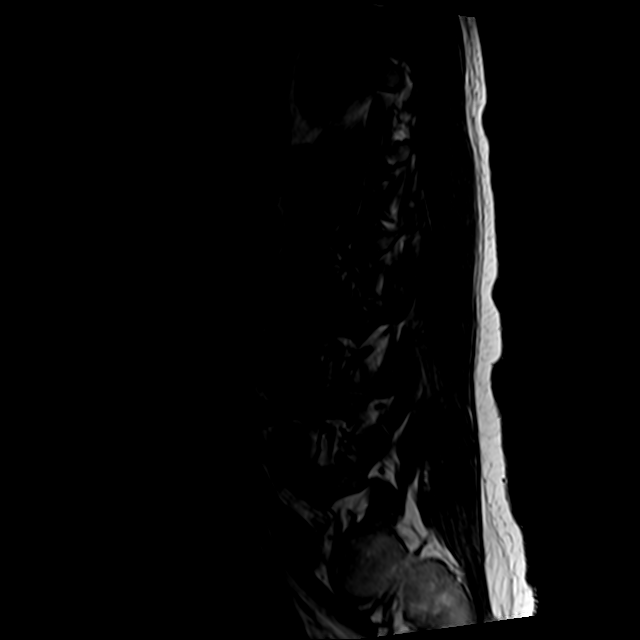
[im 5/15]
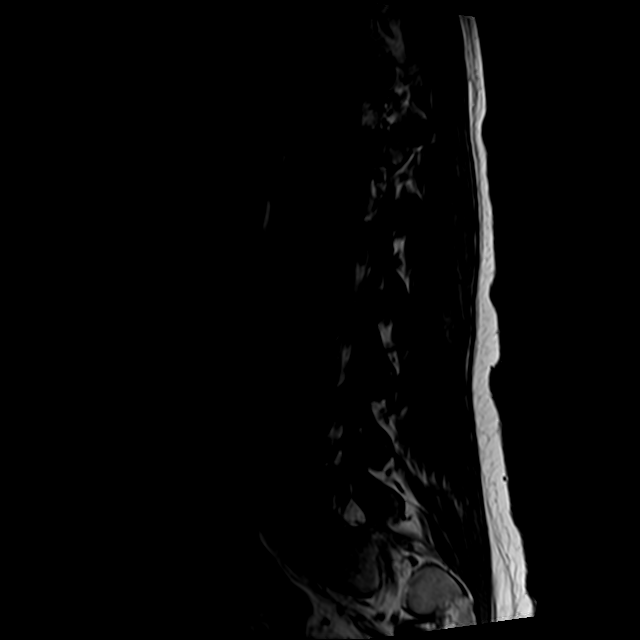
[im 7/15]
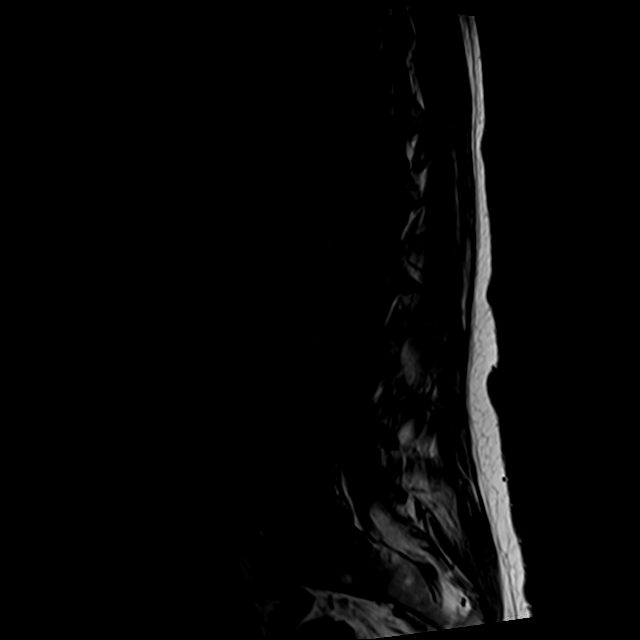
[im 9/15]
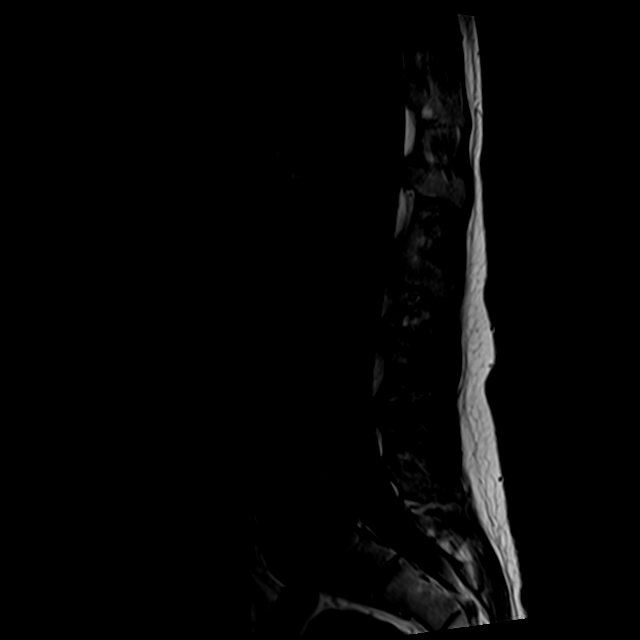
[im 13/15]
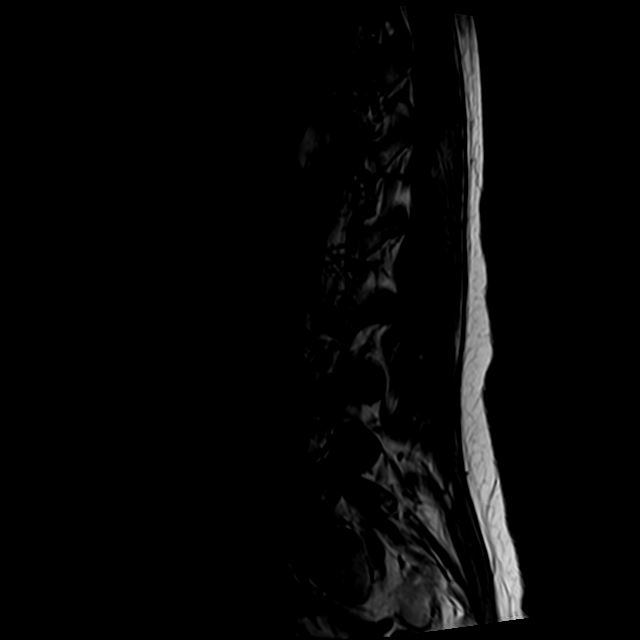

[Series 5: STIR · sagittal · 4.0mm · 0.51mm/px · 3 of 15 slices shown]
[im 3/15]
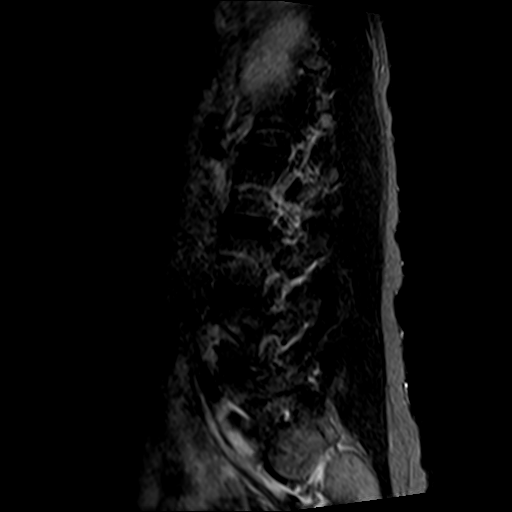
[im 9/15]
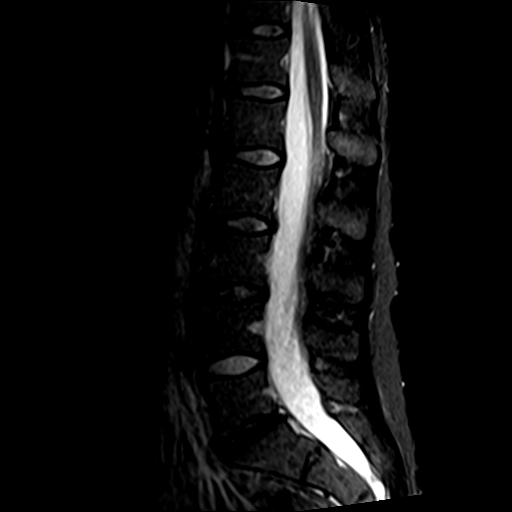
[im 13/15]
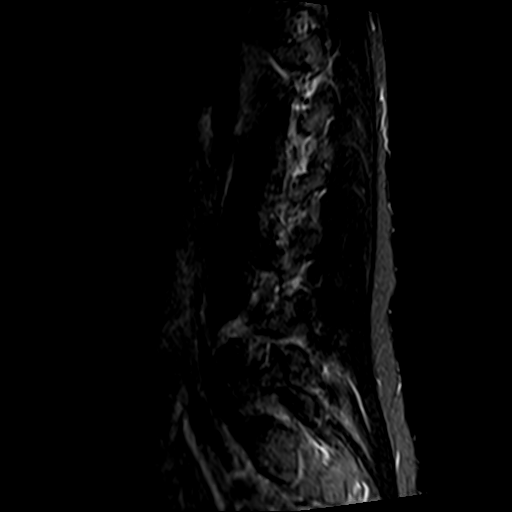

[Series 6: T2 · axial · 4.0mm · 0.78mm/px · z∈[-50,+152]mm · 11 of 40 slices shown (2 of 2)]
[im 2/40]
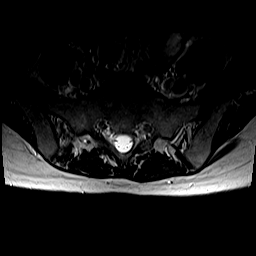
[im 6/40]
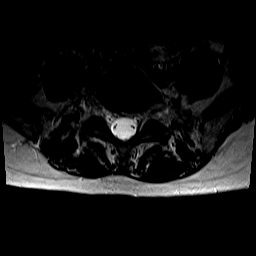
[im 8/40]
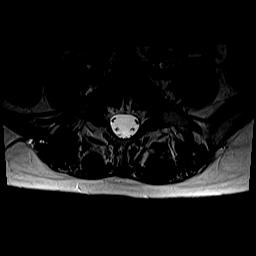
[im 13/40]
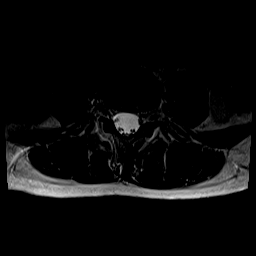
[im 18/40]
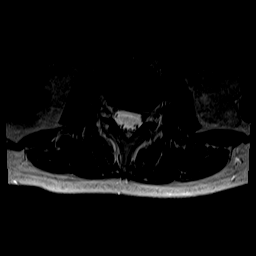
[im 20/40]
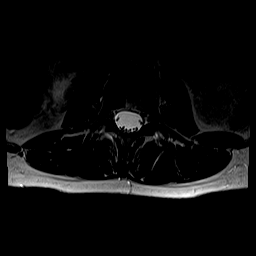
[im 22/40]
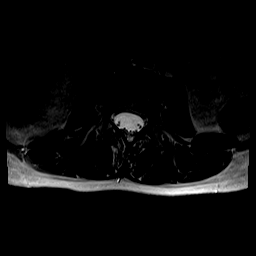
[im 27/40]
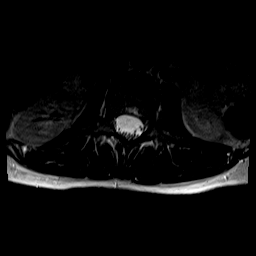
[im 32/40]
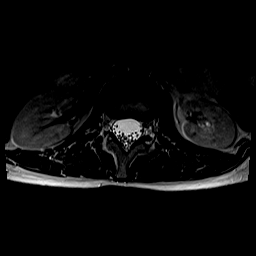
[im 34/40]
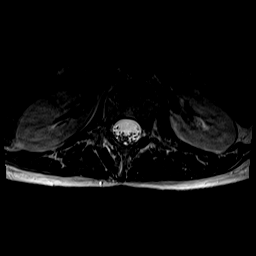
[im 38/40]
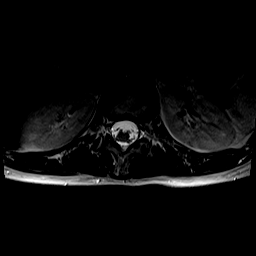

[29 of 48 positions shown; findings below may reference images not displayed]

FINDINGS: Segmentation:  Normal

Alignment:  Normal

Vertebrae:  Normal bone marrow. Negative for fracture or mass.

Conus medullaris and cauda equina: Conus extends to the L1-2 level.
Conus and cauda equina appear normal.

Paraspinal and other soft tissues: Negative for paraspinous mass or
adenopathy.

Disc levels:

L1-2: Negative

L2-3: Small left paracentral disc protrusion. Negative for spinal or
foraminal stenosis.

L3-4: Mild disc degeneration. Small left paracentral disc protrusion
without spinal or foraminal stenosis

L4-5: Negative

L5-S1: Small central disc protrusion. Negative for neural
impingement
IMPRESSION: Mild lumbar spine degenerative change without stenosis or neural
impingement.

Small left-sided disc protrusions L2-3 and L3-4. Small central disc
protrusion L5-S1.

## 2021-01-24 DIAGNOSIS — F902 Attention-deficit hyperactivity disorder, combined type: Secondary | ICD-10-CM | POA: Diagnosis not present

## 2021-02-20 DIAGNOSIS — F411 Generalized anxiety disorder: Secondary | ICD-10-CM | POA: Diagnosis not present

## 2021-02-20 DIAGNOSIS — Z13220 Encounter for screening for lipoid disorders: Secondary | ICD-10-CM | POA: Diagnosis not present

## 2021-02-20 DIAGNOSIS — F902 Attention-deficit hyperactivity disorder, combined type: Secondary | ICD-10-CM | POA: Diagnosis not present

## 2021-02-20 DIAGNOSIS — Z130 Encounter for screening for diseases of the blood and blood-forming organs and certain disorders involving the immune mechanism: Secondary | ICD-10-CM | POA: Diagnosis not present

## 2021-02-20 DIAGNOSIS — Z13228 Encounter for screening for other metabolic disorders: Secondary | ICD-10-CM | POA: Diagnosis not present

## 2021-02-20 DIAGNOSIS — Z1329 Encounter for screening for other suspected endocrine disorder: Secondary | ICD-10-CM | POA: Diagnosis not present

## 2021-02-20 DIAGNOSIS — Z79899 Other long term (current) drug therapy: Secondary | ICD-10-CM | POA: Diagnosis not present

## 2021-02-20 DIAGNOSIS — Z1389 Encounter for screening for other disorder: Secondary | ICD-10-CM | POA: Diagnosis not present

## 2021-02-20 DIAGNOSIS — Z0000 Encounter for general adult medical examination without abnormal findings: Secondary | ICD-10-CM | POA: Diagnosis not present

## 2021-02-20 DIAGNOSIS — F332 Major depressive disorder, recurrent severe without psychotic features: Secondary | ICD-10-CM | POA: Diagnosis not present

## 2022-02-12 ENCOUNTER — Encounter: Payer: Self-pay | Admitting: *Deleted

## 2022-05-03 ENCOUNTER — Encounter: Payer: Self-pay | Admitting: *Deleted
